# Patient Record
Sex: Female | Born: 1975 | Race: Black or African American | Hispanic: No | Marital: Married | Smoking: Former smoker
Health system: Southern US, Community
[De-identification: ages and names within clinical notes are randomized; demographics above are authoritative.]

## PROBLEM LIST (undated history)

## (undated) DIAGNOSIS — D649 Anemia, unspecified: Secondary | ICD-10-CM

## (undated) DIAGNOSIS — R519 Headache, unspecified: Secondary | ICD-10-CM

## (undated) DIAGNOSIS — R42 Dizziness and giddiness: Secondary | ICD-10-CM

## (undated) DIAGNOSIS — B019 Varicella without complication: Secondary | ICD-10-CM

## (undated) DIAGNOSIS — G4730 Sleep apnea, unspecified: Secondary | ICD-10-CM

## (undated) DIAGNOSIS — J449 Chronic obstructive pulmonary disease, unspecified: Secondary | ICD-10-CM

## (undated) DIAGNOSIS — G935 Compression of brain: Secondary | ICD-10-CM

## (undated) DIAGNOSIS — I10 Essential (primary) hypertension: Secondary | ICD-10-CM

## (undated) DIAGNOSIS — R51 Headache: Secondary | ICD-10-CM

## (undated) DIAGNOSIS — J309 Allergic rhinitis, unspecified: Secondary | ICD-10-CM

## (undated) DIAGNOSIS — D573 Sickle-cell trait: Secondary | ICD-10-CM

## (undated) DIAGNOSIS — D259 Leiomyoma of uterus, unspecified: Secondary | ICD-10-CM

## (undated) HISTORY — PX: OTHER SURGICAL HISTORY: SHX169

## (undated) HISTORY — DX: Essential (primary) hypertension: I10

## (undated) HISTORY — DX: Compression of brain: G93.5

## (undated) HISTORY — DX: Varicella without complication: B01.9

## (undated) HISTORY — DX: Sickle-cell trait: D57.3

## (undated) HISTORY — DX: Allergic rhinitis, unspecified: J30.9

## (undated) HISTORY — PX: BRONCHOSCOPY: SUR163

---

## 2004-09-09 ENCOUNTER — Emergency Department: Payer: Self-pay | Admitting: Emergency Medicine

## 2005-01-28 ENCOUNTER — Emergency Department: Payer: Self-pay | Admitting: Emergency Medicine

## 2006-03-28 ENCOUNTER — Emergency Department: Payer: Self-pay | Admitting: General Practice

## 2006-07-04 ENCOUNTER — Emergency Department: Payer: Self-pay | Admitting: Emergency Medicine

## 2006-11-04 ENCOUNTER — Emergency Department: Payer: Self-pay | Admitting: Emergency Medicine

## 2007-03-19 ENCOUNTER — Emergency Department: Payer: Self-pay | Admitting: Emergency Medicine

## 2007-03-19 ENCOUNTER — Other Ambulatory Visit: Payer: Self-pay

## 2007-12-12 ENCOUNTER — Emergency Department: Payer: Self-pay | Admitting: Internal Medicine

## 2008-03-10 ENCOUNTER — Emergency Department: Payer: Self-pay | Admitting: Emergency Medicine

## 2008-03-22 ENCOUNTER — Ambulatory Visit: Payer: Self-pay | Admitting: Unknown Physician Specialty

## 2008-12-22 ENCOUNTER — Emergency Department (HOSPITAL_COMMUNITY): Admission: EM | Admit: 2008-12-22 | Discharge: 2008-12-22 | Payer: Self-pay | Admitting: Emergency Medicine

## 2009-04-26 ENCOUNTER — Emergency Department: Payer: Self-pay | Admitting: Emergency Medicine

## 2009-05-04 ENCOUNTER — Emergency Department: Payer: Self-pay | Admitting: Emergency Medicine

## 2009-09-26 ENCOUNTER — Emergency Department: Payer: Self-pay | Admitting: Emergency Medicine

## 2009-10-15 ENCOUNTER — Ambulatory Visit: Payer: Self-pay | Admitting: Specialist

## 2010-05-21 ENCOUNTER — Ambulatory Visit: Payer: Self-pay | Admitting: Specialist

## 2010-09-04 ENCOUNTER — Emergency Department: Payer: Self-pay | Admitting: Emergency Medicine

## 2011-03-12 ENCOUNTER — Emergency Department (HOSPITAL_COMMUNITY)
Admission: EM | Admit: 2011-03-12 | Discharge: 2011-03-12 | Disposition: A | Discharge status: Home / Self Care | Payer: Worker's Compensation | Attending: Emergency Medicine | Admitting: Emergency Medicine

## 2011-03-12 ENCOUNTER — Emergency Department (HOSPITAL_COMMUNITY): Payer: Worker's Compensation

## 2011-03-12 DIAGNOSIS — M5126 Other intervertebral disc displacement, lumbar region: Secondary | ICD-10-CM | POA: Insufficient documentation

## 2011-03-12 DIAGNOSIS — X500XXA Overexertion from strenuous movement or load, initial encounter: Secondary | ICD-10-CM | POA: Insufficient documentation

## 2011-03-12 DIAGNOSIS — Y9269 Other specified industrial and construction area as the place of occurrence of the external cause: Secondary | ICD-10-CM | POA: Insufficient documentation

## 2011-03-12 DIAGNOSIS — Y93F2 Activity, caregiving, lifting: Secondary | ICD-10-CM | POA: Insufficient documentation

## 2011-04-06 ENCOUNTER — Ambulatory Visit (HOSPITAL_COMMUNITY)
Admission: RE | Admit: 2011-04-06 | Discharge: 2011-04-06 | Disposition: A | Discharge status: Home / Self Care | Payer: Worker's Compensation | Source: Ambulatory Visit | Attending: Neurosurgery | Admitting: Neurosurgery

## 2011-04-06 DIAGNOSIS — M6281 Muscle weakness (generalized): Secondary | ICD-10-CM | POA: Insufficient documentation

## 2011-04-06 DIAGNOSIS — M545 Low back pain, unspecified: Secondary | ICD-10-CM | POA: Insufficient documentation

## 2011-04-06 DIAGNOSIS — IMO0001 Reserved for inherently not codable concepts without codable children: Secondary | ICD-10-CM | POA: Insufficient documentation

## 2011-04-09 ENCOUNTER — Ambulatory Visit (HOSPITAL_COMMUNITY): Payer: Self-pay | Admitting: Physical Therapy

## 2011-04-14 ENCOUNTER — Ambulatory Visit (HOSPITAL_COMMUNITY): Payer: Self-pay | Admitting: *Deleted

## 2011-04-16 ENCOUNTER — Ambulatory Visit (HOSPITAL_COMMUNITY): Payer: Self-pay | Admitting: Physical Therapy

## 2011-04-19 ENCOUNTER — Ambulatory Visit (HOSPITAL_COMMUNITY): Payer: Self-pay | Admitting: *Deleted

## 2011-04-21 ENCOUNTER — Ambulatory Visit (HOSPITAL_COMMUNITY): Payer: Self-pay | Admitting: *Deleted

## 2011-05-12 ENCOUNTER — Emergency Department (HOSPITAL_COMMUNITY)
Admission: EM | Admit: 2011-05-12 | Discharge: 2011-05-12 | Disposition: A | Discharge status: Home / Self Care | Payer: Medicaid Other | Attending: Emergency Medicine | Admitting: Emergency Medicine

## 2011-05-12 ENCOUNTER — Emergency Department (HOSPITAL_COMMUNITY): Payer: Medicaid Other

## 2011-05-12 DIAGNOSIS — Z79899 Other long term (current) drug therapy: Secondary | ICD-10-CM | POA: Insufficient documentation

## 2011-05-12 DIAGNOSIS — M549 Dorsalgia, unspecified: Secondary | ICD-10-CM | POA: Insufficient documentation

## 2011-05-12 DIAGNOSIS — R51 Headache: Secondary | ICD-10-CM | POA: Insufficient documentation

## 2011-05-12 DIAGNOSIS — G8929 Other chronic pain: Secondary | ICD-10-CM | POA: Insufficient documentation

## 2011-05-12 LAB — DIFFERENTIAL
Lymphocytes Relative: 26 % (ref 12–46)
Lymphs Abs: 3.1 10*3/uL (ref 0.7–4.0)
Monocytes Relative: 8 % (ref 3–12)
Neutro Abs: 7.6 10*3/uL (ref 1.7–7.7)
Neutrophils Relative %: 65 % (ref 43–77)

## 2011-05-12 LAB — CBC
HCT: 38.3 % (ref 36.0–46.0)
Hemoglobin: 13 g/dL (ref 12.0–15.0)
MCH: 27.4 pg (ref 26.0–34.0)
MCV: 80.8 fL (ref 78.0–100.0)
RBC: 4.74 MIL/uL (ref 3.87–5.11)
WBC: 11.7 10*3/uL — ABNORMAL HIGH (ref 4.0–10.5)

## 2011-05-12 LAB — BASIC METABOLIC PANEL
BUN: 7 mg/dL (ref 6–23)
CO2: 32 mEq/L (ref 19–32)
Chloride: 98 mEq/L (ref 96–112)
Creatinine, Ser: 0.68 mg/dL (ref 0.50–1.10)
Potassium: 3.5 mEq/L (ref 3.5–5.1)

## 2011-05-17 ENCOUNTER — Other Ambulatory Visit (HOSPITAL_COMMUNITY): Payer: Self-pay | Admitting: Family Medicine

## 2011-05-17 DIAGNOSIS — E049 Nontoxic goiter, unspecified: Secondary | ICD-10-CM

## 2011-05-20 ENCOUNTER — Ambulatory Visit (HOSPITAL_COMMUNITY)
Admission: RE | Admit: 2011-05-20 | Discharge: 2011-05-20 | Disposition: A | Discharge status: Home / Self Care | Payer: Medicaid Other | Source: Ambulatory Visit | Attending: Family Medicine | Admitting: Family Medicine

## 2011-05-20 DIAGNOSIS — E049 Nontoxic goiter, unspecified: Secondary | ICD-10-CM | POA: Insufficient documentation

## 2012-02-14 ENCOUNTER — Encounter (HOSPITAL_COMMUNITY): Payer: Self-pay

## 2012-02-14 ENCOUNTER — Emergency Department (HOSPITAL_COMMUNITY)
Admission: EM | Admit: 2012-02-14 | Discharge: 2012-02-14 | Disposition: A | Discharge status: Home / Self Care | Payer: Medicaid Other | Attending: Emergency Medicine | Admitting: Emergency Medicine

## 2012-02-14 DIAGNOSIS — N949 Unspecified condition associated with female genital organs and menstrual cycle: Secondary | ICD-10-CM | POA: Insufficient documentation

## 2012-02-14 DIAGNOSIS — R102 Pelvic and perineal pain: Secondary | ICD-10-CM

## 2012-02-14 DIAGNOSIS — N730 Acute parametritis and pelvic cellulitis: Secondary | ICD-10-CM | POA: Insufficient documentation

## 2012-02-14 LAB — CBC
HCT: 35.8 % — ABNORMAL LOW (ref 36.0–46.0)
Hemoglobin: 12.1 g/dL (ref 12.0–15.0)
MCH: 27.4 pg (ref 26.0–34.0)
MCHC: 33.8 g/dL (ref 30.0–36.0)
RDW: 14.1 % (ref 11.5–15.5)

## 2012-02-14 LAB — URINALYSIS, ROUTINE W REFLEX MICROSCOPIC
Bilirubin Urine: NEGATIVE
Glucose, UA: NEGATIVE mg/dL
Ketones, ur: NEGATIVE mg/dL
Leukocytes, UA: NEGATIVE
Nitrite: NEGATIVE
Specific Gravity, Urine: 1.02 (ref 1.005–1.030)
pH: 5.5 (ref 5.0–8.0)

## 2012-02-14 LAB — BASIC METABOLIC PANEL
BUN: 5 mg/dL — ABNORMAL LOW (ref 6–23)
Calcium: 9.6 mg/dL (ref 8.4–10.5)
Chloride: 103 mEq/L (ref 96–112)
Creatinine, Ser: 0.66 mg/dL (ref 0.50–1.10)
GFR calc Af Amer: >90 mL/min (ref >90)
GFR calc non Af Amer: >90 mL/min (ref >90)

## 2012-02-14 LAB — DIFFERENTIAL
Basophils Absolute: 0 10*3/uL (ref 0.0–0.1)
Basophils Relative: 0 % (ref 0–1)
Eosinophils Absolute: 0.1 10*3/uL (ref 0.0–0.7)
Eosinophils Relative: 1 % (ref 0–5)
Monocytes Absolute: 0.5 10*3/uL (ref 0.1–1.0)
Monocytes Relative: 7 % (ref 3–12)
Neutro Abs: 5.7 10*3/uL (ref 1.7–7.7)

## 2012-02-14 LAB — WET PREP, GENITAL
Trich, Wet Prep: NONE SEEN
Yeast Wet Prep HPF POC: NONE SEEN

## 2012-02-14 MED ORDER — HYDROCODONE-ACETAMINOPHEN 5-325 MG PO TABS: ORAL_TABLET | ORAL | Status: AC

## 2012-02-14 MED ORDER — CEFTRIAXONE SODIUM 1 G IJ SOLR
0.5000 g | Freq: Once | INTRAMUSCULAR | Status: AC
  Administered 2012-02-14: 0.5 g via INTRAMUSCULAR
  Filled 2012-02-14: qty 10

## 2012-02-14 MED ORDER — DOXYCYCLINE HYCLATE 100 MG PO CAPS: 100.0000 mg | ORAL_CAPSULE | Freq: Two times a day (BID) | ORAL | Status: AC

## 2012-02-14 MED ORDER — LIDOCAINE HCL (PF) 1 % IJ SOLN
INTRAMUSCULAR | Status: AC
  Administered 2012-02-14: 2.1 mL
  Filled 2012-02-14: qty 5

## 2012-02-14 MED ORDER — METRONIDAZOLE 500 MG PO TABS: 500.0000 mg | ORAL_TABLET | Freq: Two times a day (BID) | ORAL | Status: AC

## 2012-02-14 NOTE — ED Notes (Signed)
Complain of pressure in pelvic area. States she thinks she has a bladder infection. States she has a discharge and foul vaginal odor earlier

## 2012-02-14 NOTE — Discharge Instructions (Signed)
Pelvic Inflammatory Disease Pelvic inflammatory disease (PID) is an infection of some, or all, of the female pelvic organs. PID is caused by germs. HOME CARE  Take your medicine as told. Finish them even if you start to feel better.   Only take medicine as told by your doctor.   Do not have sex (intercourse) until the infection is gone.   Tell your sex partner you have PID. Treatment may be needed.   Keep your follow-up appointments.  GET HELP RIGHT AWAY IF:   You have a fever.   You have an increase in belly (abdominal) pain.   You start to get the chills.   You have pain when you pee (urinate).   You are not better after 72 hours.   Your sex partner tells you he or she has an STD.   You throw up (vomit).   You cannot take your medicines.  MAKE SURE YOU:   Understand these instructions.   Will watch your condition.   Will get help right away if you are not doing well or get worse.  Document Released: 01/21/2009 Document Revised: 10/14/2011 Document Reviewed: 02/24/2010 [facility] Patient Information 2012 [place], [place].

## 2012-02-14 NOTE — ED Provider Notes (Signed)
History     CSN: [id number]  Arrival date & time 02/14/12  8657   First MD Initiated Contact with Patient 02/14/12 1021      Chief Complaint  Patient presents with  . Urinary Tract Infection    (Consider location/radiation/quality/duration/timing/severity/associated sxs/prior treatment) HPI Comments: Patient complains of lower crampy pelvic pain for several days. She also reports having a vaginal discharge with odor several days ago discharge has been waxing and waning. She does report some burning with urination and is concerned she has a urinary tract infection. She describes the pain as cramping and worse with certain movements. She denies fever, vomiting, vaginal bleeding or upper abdominal pain. She does report having a new sexual partner for 3 months.  Patient is a 35 y.o. female presenting with abdominal pain. The history is provided by the patient.  Abdominal Pain The primary symptoms of the illness include abdominal pain, nausea, dysuria and vaginal discharge. The primary symptoms of the illness do not include fever, fatigue, shortness of breath, vomiting, diarrhea, hematemesis, hematochezia or vaginal bleeding. The current episode started more than 2 days ago. The onset of the illness was gradual. The problem has not changed since onset. The abdominal pain began more than 2 days ago. The pain came on gradually. The abdominal pain is located in the suprapubic region. Pain radiation: Lower back. The abdominal pain is relieved by nothing. The abdominal pain is exacerbated by certain positions.  The dysuria is associated with vaginal pain. The dysuria is not associated with hematuria, frequency or urgency.  The vaginal discharge was first noticed more than 2 days ago. Vaginal discharge is a new problem. The amount of discharge is variable. The color of the discharge is white and yellow. The discharge is described as homogeneous. The vaginal discharge is associated with dysuria. The vaginal  discharge is not associated with itching or burning.  The patient states that she believes she is currently not pregnant. The patient has not had a change in bowel habit. Additional symptoms associated with the illness include back pain. Symptoms associated with the illness do not include chills, constipation, urgency, hematuria or frequency.    History reviewed. No pertinent past medical history.  History reviewed. No pertinent past surgical history.  No family history on file.  History  Substance Use Topics  . Smoking status: Not on file  . Smokeless tobacco: Not on file  . Alcohol Use: No    OB History    Grav Para Term Preterm Abortions TAB SAB Ect Mult Living                  Review of Systems  Constitutional: Negative for fever, chills and fatigue.  Respiratory: Negative for shortness of breath.   Gastrointestinal: Positive for nausea and abdominal pain. Negative for vomiting, diarrhea, constipation, blood in stool, hematochezia, abdominal distention and hematemesis.  Genitourinary: Positive for dysuria, vaginal discharge and vaginal pain. Negative for urgency, frequency, hematuria, flank pain, vaginal bleeding, difficulty urinating and menstrual problem.  Musculoskeletal: Positive for back pain.  Skin: Negative.  Negative for itching.  All other systems reviewed and are negative.    Allergies  Review of patient's allergies indicates no known allergies.  Home Medications  No current outpatient prescriptions on file.  BP 126/92  Pulse 72  Temp(Src) 98.3 F (36.8 C) (Oral)  Resp 20  Ht 5\' 6"  (1.676 m)  Wt 160 lb (72.576 kg)  BMI 25.82 kg/m2  SpO2 100%  LMP 01/25/2012  Physical Exam  Nursing note and vitals reviewed. Constitutional: She is oriented to person, place, and time. She appears well-developed and well-nourished. No distress.  HENT:  Head: Normocephalic and atraumatic.  Mouth/Throat: Oropharynx is clear and moist.  Cardiovascular: Normal rate,  regular rhythm and normal heart sounds.   Pulmonary/Chest: Effort normal and breath sounds normal. No respiratory distress.  Abdominal: Soft. She exhibits no distension. There is no hepatosplenomegaly. There is tenderness in the suprapubic area. There is no rigidity, no rebound, no guarding and no CVA tenderness.  Genitourinary: Uterus normal. Cervix exhibits motion tenderness and discharge. Cervix exhibits no friability. Right adnexum displays no mass and no tenderness. Left adnexum displays no mass and no tenderness. No bleeding around the vagina. No foreign body around the vagina. Vaginal discharge found.  Musculoskeletal: Normal range of motion.  Neurological: She is alert and oriented to person, place, and time. She exhibits normal muscle tone. Coordination normal.  Skin: Skin is warm and dry.    ED Course  Procedures (including critical care time)  Results for orders placed during the hospital encounter of 02/14/12  URINALYSIS, ROUTINE W REFLEX MICROSCOPIC      Component Value Range   Color, Urine YELLOW  YELLOW    APPearance CLEAR  CLEAR    Specific Gravity, Urine 1.020  1.005 - 1.030    pH 5.5  5.0 - 8.0    Glucose, UA NEGATIVE  NEGATIVE (mg/dL)   Hgb urine dipstick NEGATIVE  NEGATIVE    Bilirubin Urine NEGATIVE  NEGATIVE    Ketones, ur NEGATIVE  NEGATIVE (mg/dL)   Protein, ur NEGATIVE  NEGATIVE (mg/dL)   Urobilinogen, UA 0.2  0.0 - 1.0 (mg/dL)   Nitrite NEGATIVE  NEGATIVE    Leukocytes, UA NEGATIVE  NEGATIVE   PREGNANCY, URINE      Component Value Range   Preg Test, Ur NEGATIVE  NEGATIVE   WET PREP, GENITAL      Component Value Range   Yeast Wet Prep HPF POC NONE SEEN  NONE SEEN    Trich, Wet Prep NONE SEEN  NONE SEEN    Clue Cells Wet Prep HPF POC MANY (*) NONE SEEN    WBC, Wet Prep HPF POC FEW (*) NONE SEEN   CBC      Component Value Range   WBC 8.4  4.0 - 10.5 (K/uL)   RBC 4.41  3.87 - 5.11 (MIL/uL)   Hemoglobin 12.1  12.0 - 15.0 (g/dL)   HCT 16.1 (*) 09.6 - 46.0  (%)   MCV 81.2  78.0 - 100.0 (fL)   MCH 27.4  26.0 - 34.0 (pg)   MCHC 33.8  30.0 - 36.0 (g/dL)   RDW 04.5  40.9 - 81.1 (%)   Platelets 395  150 - 400 (K/uL)  DIFFERENTIAL      Component Value Range   Neutrophils Relative 68  43 - 77 (%)   Neutro Abs 5.7  1.7 - 7.7 (K/uL)   Lymphocytes Relative 25  12 - 46 (%)   Lymphs Abs 2.1  0.7 - 4.0 (K/uL)   Monocytes Relative 7  3 - 12 (%)   Monocytes Absolute 0.5  0.1 - 1.0 (K/uL)   Eosinophils Relative 1  0 - 5 (%)   Eosinophils Absolute 0.1  0.0 - 0.7 (K/uL)   Basophils Relative 0  0 - 1 (%)   Basophils Absolute 0.0  0.0 - 0.1 (K/uL)  BASIC METABOLIC PANEL      Component Value Range   Sodium 138  135 - 145 (mEq/L)   Potassium 3.9  3.5 - 5.1 (mEq/L)   Chloride 103  96 - 112 (mEq/L)   CO2 28  19 - 32 (mEq/L)   Glucose, Bld 95  70 - 99 (mg/dL)   BUN 5 (*) 6 - 23 (mg/dL)   Creatinine, Ser 1.61  0.50 - 1.10 (mg/dL)   Calcium 9.6  8.4 - 09.6 (mg/dL)   GFR calc non Af Amer >90  >90 (mL/min)   GFR calc Af Amer >90  >90 (mL/min)      GC and Chlamydia cultures are pending   MDM     Vital signs are stable patient is nontoxic appearing. Abdomen is soft and nontender. Mild to moderate pelvic tenderness on exam with some cervical motion tenderness also present. Probable PID, the patient appears stable for discharge.   I will treat with IM injection of Rocephin here in the ED, and prescribe doxycycline and Flagyl. Advise patient of treatment plan and laboratory results she agrees to followup with a gynecologist or to return to the ED for any worsening symptoms  Patient / Family / Caregiver understand and agree with initial ED impression and plan with expectations set for ED visit. Pt stable in ED with no significant deterioration in condition. Pt feels improved after observation and/or treatment in ED.    Avelardo Reesman L. [place], [place] 02/14/12 1234

## 2012-02-14 NOTE — ED Provider Notes (Signed)
Medical screening examination/treatment/procedure(s) were performed by non-physician practitioner and as supervising physician I was immediately available for consultation/collaboration.   Claudette Wermuth L Sreya Froio, MD 02/14/12 1547 

## 2012-02-15 LAB — GC/CHLAMYDIA PROBE AMP, GENITAL: Chlamydia, DNA Probe: NEGATIVE

## 2012-03-01 ENCOUNTER — Emergency Department (HOSPITAL_COMMUNITY): Payer: Medicaid Other

## 2012-03-01 ENCOUNTER — Emergency Department (HOSPITAL_COMMUNITY)
Admission: EM | Admit: 2012-03-01 | Discharge: 2012-03-01 | Disposition: A | Discharge status: Home / Self Care | Payer: Medicaid Other | Attending: Emergency Medicine | Admitting: Emergency Medicine

## 2012-03-01 ENCOUNTER — Other Ambulatory Visit: Payer: Self-pay

## 2012-03-01 ENCOUNTER — Encounter (HOSPITAL_COMMUNITY): Payer: Self-pay | Admitting: Emergency Medicine

## 2012-03-01 DIAGNOSIS — R0602 Shortness of breath: Secondary | ICD-10-CM | POA: Insufficient documentation

## 2012-03-01 DIAGNOSIS — J4489 Other specified chronic obstructive pulmonary disease: Secondary | ICD-10-CM | POA: Insufficient documentation

## 2012-03-01 DIAGNOSIS — J449 Chronic obstructive pulmonary disease, unspecified: Secondary | ICD-10-CM | POA: Insufficient documentation

## 2012-03-01 DIAGNOSIS — R0789 Other chest pain: Secondary | ICD-10-CM

## 2012-03-01 DIAGNOSIS — R42 Dizziness and giddiness: Secondary | ICD-10-CM | POA: Insufficient documentation

## 2012-03-01 DIAGNOSIS — R071 Chest pain on breathing: Secondary | ICD-10-CM | POA: Insufficient documentation

## 2012-03-01 HISTORY — DX: Chronic obstructive pulmonary disease, unspecified: J44.9

## 2012-03-01 LAB — URINALYSIS, ROUTINE W REFLEX MICROSCOPIC
Glucose, UA: NEGATIVE mg/dL
Ketones, ur: NEGATIVE mg/dL
Leukocytes, UA: NEGATIVE
Nitrite: NEGATIVE
Protein, ur: NEGATIVE mg/dL
Urobilinogen, UA: 0.2 mg/dL (ref 0.0–1.0)

## 2012-03-01 LAB — URINE MICROSCOPIC-ADD ON

## 2012-03-01 LAB — DIFFERENTIAL
Basophils Relative: 0 % (ref 0–1)
Eosinophils Absolute: 0.1 10*3/uL (ref 0.0–0.7)
Eosinophils Relative: 1 % (ref 0–5)
Lymphs Abs: 2.8 10*3/uL (ref 0.7–4.0)
Neutrophils Relative %: 49 % (ref 43–77)

## 2012-03-01 LAB — CBC
MCH: 26.9 pg (ref 26.0–34.0)
MCHC: 33.4 g/dL (ref 30.0–36.0)
MCV: 80.5 fL (ref 78.0–100.0)
Platelets: 417 10*3/uL — ABNORMAL HIGH (ref 150–400)
RDW: 14.2 % (ref 11.5–15.5)

## 2012-03-01 LAB — COMPREHENSIVE METABOLIC PANEL
ALT: 10 U/L (ref 0–35)
Albumin: 4.2 g/dL (ref 3.5–5.2)
Alkaline Phosphatase: 69 U/L (ref 39–117)
Calcium: 9.7 mg/dL (ref 8.4–10.5)
GFR calc Af Amer: >90 mL/min (ref >90)
Glucose, Bld: 86 mg/dL (ref 70–99)
Potassium: 3.7 mEq/L (ref 3.5–5.1)
Sodium: 139 mEq/L (ref 135–145)
Total Protein: 8 g/dL (ref 6.0–8.3)

## 2012-03-01 LAB — PREGNANCY, URINE: Preg Test, Ur: NEGATIVE

## 2012-03-01 MED ORDER — HYDROCODONE-ACETAMINOPHEN 5-325 MG PO TABS
2.0000 | ORAL_TABLET | Freq: Once | ORAL | Status: AC
  Administered 2012-03-01: 2 via ORAL
  Filled 2012-03-01: qty 2

## 2012-03-01 MED ORDER — IBUPROFEN 800 MG PO TABS: 800.0000 mg | ORAL_TABLET | Freq: Three times a day (TID) | ORAL | Status: AC

## 2012-03-01 MED ORDER — IBUPROFEN 800 MG PO TABS
800.0000 mg | ORAL_TABLET | Freq: Once | ORAL | Status: AC
  Administered 2012-03-01: 800 mg via ORAL
  Filled 2012-03-01: qty 1

## 2012-03-01 MED ORDER — HYDROCODONE-ACETAMINOPHEN 5-325 MG PO TABS: 1.0000 | ORAL_TABLET | ORAL | Status: AC | PRN

## 2012-03-01 NOTE — Discharge Instructions (Signed)
Apply heat or ice for comfort. Use the medicines as directed. Attention to your body mechanics when moving patients.  Chest Wall Pain Chest wall pain is pain in or around the bones and muscles of your chest. It may take up to 6 weeks to get better. It may take longer if you must stay physically active in your work and activities.  CAUSES  Chest wall pain may happen on its own. However, it may be caused by:  A viral illness like the flu.   Injury.   Coughing.   Exercise.   Arthritis.   Fibromyalgia.   Shingles.  HOME CARE INSTRUCTIONS   Avoid overtiring physical activity. Try not to strain or perform activities that cause pain. This includes any activities using your chest or your abdominal and side muscles, especially if heavy weights are used.   Put ice on the sore area.   Put ice in a plastic bag.   Place a towel between your skin and the bag.   Leave the ice on for 15 to 20 minutes per hour while awake for the first 2 days.   Only take over-the-counter or prescription medicines for pain, discomfort, or fever as directed by your caregiver.  SEEK IMMEDIATE MEDICAL CARE IF:   Your pain increases, or you are very uncomfortable.   You have a fever.   Your chest pain becomes worse.   You have new, unexplained symptoms.   You have nausea or vomiting.   You feel sweaty or lightheaded.   You have a cough with phlegm (sputum), or you cough up blood.  MAKE SURE YOU:   Understand these instructions.   Will watch your condition.   Will get help right away if you are not doing well or get worse.  Document Released: 10/25/2005 Document Revised: 10/14/2011 Document Reviewed: 06/21/2011 [facility] Patient Information 2012 [place], [place].

## 2012-03-01 NOTE — ED Provider Notes (Signed)
History     CSN: [id number]  Arrival date & time 03/01/12  1345   First MD Initiated Contact with Patient 03/01/12 1405      Chief Complaint  Patient presents with  . Chest Pain  . Shortness of Breath  . Dizziness    (Consider location/radiation/quality/duration/timing/severity/associated sxs/prior treatment) HPI  Joann White is a 36 y.o. female who presents to the Emergency Department complaining of left-sided chest pain with radiation through to her back it is worse with deep breathing and movement has been present since 8:00 this morning. 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 She does recall having to do an awkward on a patient. The event occurred on Monday. She has taken no medicines. She denies fever, chills, nausea, vomiting, cough. She has had a sensation of shortness of breath and dizziness when trying to take a deep breath when the pain occurs. Past Medical History  Diagnosis Date  . COPD (chronic obstructive pulmonary disease)     History reviewed. No pertinent past surgical history.  History reviewed. No pertinent family history.  History  Substance Use Topics  . Smoking status: Current Everyday Smoker    Types: Cigarettes  . Smokeless tobacco: Not on file  . Alcohol Use: Yes    OB History    Grav Para Term Preterm Abortions TAB SAB Ect Mult Living                  Review of Systems  Constitutional: Negative for fever.       10 Systems reviewed and are negative for acute change except as noted in the HPI.  HENT: Negative for congestion.   Eyes: Negative for discharge and redness.  Respiratory: Positive for shortness of breath. Negative for cough.   Cardiovascular: Positive for chest pain.  Gastrointestinal: Negative for vomiting and abdominal pain.  Musculoskeletal: Negative for back pain.  Skin: Negative for rash.  Neurological: Positive for dizziness. Negative for syncope, numbness and headaches.    Psychiatric/Behavioral:       No behavior change.    Allergies  Hydromorphone  Home Medications   Current Outpatient Rx  Name Route Sig Dispense Refill  . ACETAMINOPHEN 500 MG PO TABS Oral Take 1,000 mg by mouth every 6 (six) hours as needed. Pain    . ALBUTEROL SULFATE HFA 108 (90 BASE) MCG/ACT IN AERS Inhalation Inhale 2 puffs into the lungs every 6 (six) hours as needed. Tightness/Wheezing    . NAPROXEN SODIUM 220 MG PO TABS Oral Take 220 mg by mouth 2 (two) times daily as needed. Pain      BP 134/99  Pulse 70  Temp(Src) 98.3 F (36.8 C) (Oral)  Resp 18  SpO2 99%  LMP 02/23/2012  Physical Exam  Nursing note and vitals reviewed. Constitutional:       Awake, alert, nontoxic appearance.  HENT:  Head: Atraumatic.  Eyes: Right eye exhibits no discharge. Left eye exhibits no discharge.  Neck: Neck supple.  Cardiovascular: Normal rate, normal heart sounds and intact distal pulses.  Exam reveals no gallop and no friction rub.   No murmur heard. Pulmonary/Chest: Effort normal. She exhibits tenderness.       Tenderness across the left chest with palpation.  Abdominal: Soft. There is no tenderness. There is no rebound.  Musculoskeletal: She exhibits no tenderness.       Baseline ROM, no obvious new focal weakness.  Neurological:  Mental status and motor strength appears baseline for patient and situation.  Skin: No rash noted.  Psychiatric: She has a normal mood and affect.    ED Course  Procedures (including critical care time) Results for orders placed during the hospital encounter of 03/01/12  URINALYSIS, ROUTINE W REFLEX MICROSCOPIC      Component Value Range   Color, Urine YELLOW  YELLOW    APPearance CLEAR  CLEAR    Specific Gravity, Urine 1.020  1.005 - 1.030    pH 5.5  5.0 - 8.0    Glucose, UA NEGATIVE  NEGATIVE (mg/dL)   Hgb urine dipstick TRACE (*) NEGATIVE    Bilirubin Urine NEGATIVE  NEGATIVE    Ketones, ur NEGATIVE  NEGATIVE (mg/dL)   Protein, ur  NEGATIVE  NEGATIVE (mg/dL)   Urobilinogen, UA 0.2  0.0 - 1.0 (mg/dL)   Nitrite NEGATIVE  NEGATIVE    Leukocytes, UA NEGATIVE  NEGATIVE   PREGNANCY, URINE      Component Value Range   Preg Test, Ur NEGATIVE  NEGATIVE   CBC      Component Value Range   WBC 6.3  4.0 - 10.5 (K/uL)   RBC 4.35  3.87 - 5.11 (MIL/uL)   Hemoglobin 11.7 (*) 12.0 - 15.0 (g/dL)   HCT 16.1 (*) 09.6 - 46.0 (%)   MCV 80.5  78.0 - 100.0 (fL)   MCH 26.9  26.0 - 34.0 (pg)   MCHC 33.4  30.0 - 36.0 (g/dL)   RDW 04.5  40.9 - 81.1 (%)   Platelets 417 (*) 150 - 400 (K/uL)  DIFFERENTIAL      Component Value Range   Neutrophils Relative 49  43 - 77 (%)   Neutro Abs 3.1  1.7 - 7.7 (K/uL)   Lymphocytes Relative 44  12 - 46 (%)   Lymphs Abs 2.8  0.7 - 4.0 (K/uL)   Monocytes Relative 6  3 - 12 (%)   Monocytes Absolute 0.4  0.1 - 1.0 (K/uL)   Eosinophils Relative 1  0 - 5 (%)   Eosinophils Absolute 0.1  0.0 - 0.7 (K/uL)   Basophils Relative 0  0 - 1 (%)   Basophils Absolute 0.0  0.0 - 0.1 (K/uL)  URINE MICROSCOPIC-ADD ON      Component Value Range   Squamous Epithelial / LPF RARE  RARE    WBC, UA 3-6  <3 (WBC/hpf)   RBC / HPF 0-2  <3 (RBC/hpf)   Bacteria, UA MANY (*) RARE     Dg Chest 2 View  03/01/2012  *RADIOLOGY REPORT*  Clinical Data: Chest pain.  Shortness of breath.  Dizziness.  CHEST - 2 VIEW  Comparison: None.  Findings: Heart size is normal.  Mediastinal shadows are normal. Lungs are clear.  The vascularity is normal.  No effusions.  No significant bony finding.  IMPRESSION: Normal chest  Original Report Authenticated By: Thomasenia Sales, M.D.       MDM  Patient presents with left-sided chest pain with radiation to her back that began this morning. It is associated with some shortness of breath and dizziness when the pain is present. 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 She has been given analgesics and anti-inflammatory. Laboratory values are  normal, chest x-ray is unremarkable, EKG is unremarkable.Pt stable in ED with no significant deterioration in condition.The patient appears reasonably screened and/or stabilized for discharge and I doubt any other medical  condition or other [facility] requiring further screening, evaluation, or treatment in the ED at this time prior to discharge.  MDM Reviewed: nursing note and vitals Interpretation: labs, ECG and x-ray           Nicoletta Dress. Colon Branch, MD 03/01/12 1550

## 2012-03-01 NOTE — ED Notes (Signed)
Discharge instructions reviewed with pt, questions answered. Pt verbalized understanding.  

## 2012-03-01 NOTE — ED Notes (Signed)
Pt c/o left chest pain that radiates to her back. Pt also c/o dizziness and sob.

## 2013-04-19 ENCOUNTER — Emergency Department: Payer: Self-pay | Admitting: Emergency Medicine

## 2013-04-19 LAB — BASIC METABOLIC PANEL
Calcium, Total: 9.6 mg/dL (ref 8.5–10.1)
Chloride: 104 mmol/L (ref 98–107)
EGFR (Non-African Amer.): >60
Glucose: 109 mg/dL — ABNORMAL HIGH (ref 65–99)
Osmolality: 272 (ref 275–301)
Potassium: 3.6 mmol/L (ref 3.5–5.1)
Sodium: 137 mmol/L (ref 136–145)

## 2013-04-19 LAB — TROPONIN I: Troponin-I: <0.02 ng/mL

## 2013-04-19 LAB — CBC
HCT: 36.9 % (ref 35.0–47.0)
MCH: 26.9 pg (ref 26.0–34.0)
Platelet: 408 10*3/uL (ref 150–440)

## 2013-11-29 ENCOUNTER — Emergency Department: Payer: Self-pay | Admitting: Emergency Medicine

## 2013-11-29 LAB — RAPID INFLUENZA A&B ANTIGENS

## 2014-04-26 ENCOUNTER — Emergency Department: Payer: Self-pay | Admitting: Emergency Medicine

## 2014-04-26 LAB — URINALYSIS, COMPLETE
BILIRUBIN, UR: NEGATIVE
BLOOD: NEGATIVE
Bacteria: NONE SEEN
GLUCOSE, UR: NEGATIVE mg/dL (ref 0–75)
Ketone: NEGATIVE
LEUKOCYTE ESTERASE: NEGATIVE
NITRITE: NEGATIVE
Ph: 5 (ref 4.5–8.0)
Protein: NEGATIVE
Specific Gravity: 1.018 (ref 1.003–1.030)
WBC UR: 2 /HPF (ref 0–5)

## 2014-07-31 ENCOUNTER — Encounter (HOSPITAL_COMMUNITY): Payer: Self-pay | Admitting: Emergency Medicine

## 2014-07-31 ENCOUNTER — Emergency Department (HOSPITAL_COMMUNITY)
Admission: EM | Admit: 2014-07-31 | Discharge: 2014-07-31 | Disposition: A | Discharge status: Home / Self Care | Payer: Worker's Compensation | Attending: Emergency Medicine | Admitting: Emergency Medicine

## 2014-07-31 DIAGNOSIS — M5432 Sciatica, left side: Secondary | ICD-10-CM

## 2014-07-31 DIAGNOSIS — J4489 Other specified chronic obstructive pulmonary disease: Secondary | ICD-10-CM | POA: Insufficient documentation

## 2014-07-31 DIAGNOSIS — Z79899 Other long term (current) drug therapy: Secondary | ICD-10-CM | POA: Insufficient documentation

## 2014-07-31 DIAGNOSIS — F17200 Nicotine dependence, unspecified, uncomplicated: Secondary | ICD-10-CM | POA: Insufficient documentation

## 2014-07-31 DIAGNOSIS — M5430 Sciatica, unspecified side: Secondary | ICD-10-CM | POA: Insufficient documentation

## 2014-07-31 DIAGNOSIS — M79609 Pain in unspecified limb: Secondary | ICD-10-CM | POA: Insufficient documentation

## 2014-07-31 DIAGNOSIS — J449 Chronic obstructive pulmonary disease, unspecified: Secondary | ICD-10-CM | POA: Insufficient documentation

## 2014-07-31 MED ORDER — HYDROCODONE-ACETAMINOPHEN 5-325 MG PO TABS: 1.0000 | ORAL_TABLET | ORAL | Status: DC | PRN

## 2014-07-31 MED ORDER — METHOCARBAMOL 500 MG PO TABS: 500.0000 mg | ORAL_TABLET | Freq: Three times a day (TID) | ORAL | Status: DC

## 2014-07-31 MED ORDER — DIAZEPAM 5 MG PO TABS
5.0000 mg | ORAL_TABLET | Freq: Once | ORAL | Status: AC
  Administered 2014-07-31: 5 mg via ORAL
  Filled 2014-07-31: qty 1

## 2014-07-31 MED ORDER — HYDROCODONE-ACETAMINOPHEN 5-325 MG PO TABS
2.0000 | ORAL_TABLET | Freq: Once | ORAL | Status: AC
  Administered 2014-07-31: 2 via ORAL
  Filled 2014-07-31: qty 2

## 2014-07-31 MED ORDER — IBUPROFEN 800 MG PO TABS: 800.0000 mg | ORAL_TABLET | Freq: Three times a day (TID) | ORAL | Status: DC

## 2014-07-31 MED ORDER — IBUPROFEN 800 MG PO TABS
800.0000 mg | ORAL_TABLET | Freq: Once | ORAL | Status: AC
  Administered 2014-07-31: 800 mg via ORAL
  Filled 2014-07-31: qty 1

## 2014-07-31 MED ORDER — ONDANSETRON HCL 4 MG PO TABS
4.0000 mg | ORAL_TABLET | Freq: Once | ORAL | Status: AC
  Administered 2014-07-31: 4 mg via ORAL
  Filled 2014-07-31: qty 1

## 2014-07-31 NOTE — Discharge Instructions (Signed)
In your examination suggest sciatica, or irritation of one nerves in the back. Please rest your back is much possible. Please use a heating pad when possible. Please use Motrin and Robaxin daily. Please use Norco for more severe pain. Norco and Robaxin may cause drowsiness, please use with caution. Please call the [place] [facility] at [phone], and asked for positions taking on new patients to establish a primary care physician. Please call the orthopedic specialist listed above, or the orthopedic specialist of your choice for additional evaluation concerning your back. Sciatica Sciatica is pain, weakness, numbness, or tingling along the path of the sciatic nerve. The nerve starts in the lower back and runs down the back of each leg. The nerve controls the muscles in the lower leg and in the back of the knee, while also providing sensation to the back of the thigh, lower leg, and the sole of your foot. Sciatica is a symptom of another medical condition. For instance, nerve damage or certain conditions, such as a herniated disk or bone spur on the spine, pinch or put pressure on the sciatic nerve. This causes the pain, weakness, or other sensations normally associated with sciatica. Generally, sciatica only affects one side of the body. CAUSES   Herniated or slipped disc.  Degenerative disk disease.  A pain disorder involving the narrow muscle in the buttocks (piriformis syndrome).  Pelvic injury or fracture.  Pregnancy.  Tumor (rare). SYMPTOMS  Symptoms can vary from mild to very severe. The symptoms usually travel from the low back to the buttocks and down the back of the leg. Symptoms can include:  Mild tingling or dull aches in the lower back, leg, or hip.  Numbness in the back of the calf or sole of the foot.  Burning sensations in the lower back, leg, or hip.  Sharp pains in the lower back, leg, or hip.  Leg weakness.  Severe back pain inhibiting movement. These symptoms may  get worse with coughing, sneezing, laughing, or prolonged sitting or standing. Also, being overweight may worsen symptoms. DIAGNOSIS  Your caregiver will perform a physical exam to look for common symptoms of sciatica. He or she may ask you to do certain movements or activities that would trigger sciatic nerve pain. Other tests may be performed to find the cause of the sciatica. These may include:  Blood tests.  X-rays.  Imaging tests, such as an MRI or CT scan. TREATMENT  Treatment is directed at the cause of the sciatic pain. Sometimes, treatment is not necessary and the pain and discomfort goes away on its own. If treatment is needed, your caregiver may suggest:  Over-the-counter medicines to relieve pain.  Prescription medicines, such as anti-inflammatory medicine, muscle relaxants, or narcotics.  Applying heat or ice to the painful area.  Steroid injections to lessen pain, irritation, and inflammation around the nerve.  Reducing activity during periods of pain.  Exercising and stretching to strengthen your abdomen and improve flexibility of your spine. Your caregiver may suggest losing weight if the extra weight makes the back pain worse.  Physical therapy.  Surgery to eliminate what is pressing or pinching the nerve, such as a bone spur or part of a herniated disk. HOME CARE INSTRUCTIONS   Only take over-the-counter or prescription medicines for pain or discomfort as directed by your caregiver.  Apply ice to the affected area for 20 minutes, 3-4 times a day for the first 48-72 hours. Then try heat in the same way.  Exercise, stretch, or  perform your usual activities if these do not aggravate your pain.  Attend physical therapy sessions as directed by your caregiver.  Keep all follow-up appointments as directed by your caregiver.  Do not wear high heels or shoes that do not provide proper support.  Check your mattress to see if it is too soft. A firm mattress may lessen  your pain and discomfort. SEEK IMMEDIATE MEDICAL CARE IF:   You lose control of your bowel or bladder (incontinence).  You have increasing weakness in the lower back, pelvis, buttocks, or legs.  You have redness or swelling of your back.  You have a burning sensation when you urinate.  You have pain that gets worse when you lie down or awakens you at night.  Your pain is worse than you have experienced in the past.  Your pain is lasting longer than 4 weeks.  You are suddenly losing weight without reason. MAKE SURE YOU:  Understand these instructions.  Will watch your condition.  Will get help right away if you are not doing well or get worse. Document Released: 10/19/2001 Document Revised: 04/25/2012 Document Reviewed: 03/05/2012 [facility] Patient Information 2015 [place], [place]. This information is not intended to replace advice given to you by your health care provider. Make sure you discuss any questions you have with your health care provider.

## 2014-07-31 NOTE — ED Notes (Signed)
Pt c/o left leg pain that radiates down to the foot.

## 2014-07-31 NOTE — ED Provider Notes (Signed)
CSN: [id number]     Arrival date & time 07/31/14  2022 History   First MD Initiated Contact with Patient 07/31/14 2137     Chief Complaint  Patient presents with  . Leg Pain     (Consider location/radiation/quality/duration/timing/severity/associated sxs/prior Treatment) HPI Comments: Will patient is a 38 year old female who presents to the emergency department with left leg pain. The patient states that she has pain in her lower back that radiates into the left buttocks and then down the left leg into the left first toe. The pain is described as a pins and needles sensation accompanied by a burning sensation. His been no injury to the back, or to the leg. It is of note that the patient states that 2 or 3 years ago she was diagnosed with some degenerative disc disease changes. He's not had any followup concerning this during this time. The patient has not had any loss of bowel or bladder function. The pain is getting progressively worse over the last 2 or 3 days. Activity seems to aggravate it more than anything else.  Patient is a 38 y.o. female presenting with leg pain. The history is provided by the patient.  Leg Pain Associated symptoms: back pain   Associated symptoms: no neck pain     Past Medical History  Diagnosis Date  . COPD (chronic obstructive pulmonary disease)    History reviewed. No pertinent past surgical history. History reviewed. No pertinent family history. History  Substance Use Topics  . Smoking status: Current Every Day Smoker    Types: Cigarettes  . Smokeless tobacco: Not on file  . Alcohol Use: Yes   OB History   Grav Para Term Preterm Abortions TAB SAB Ect Mult Living                 Review of Systems  Constitutional: Negative for activity change.       All ROS Neg except as noted in HPI  HENT: Negative for nosebleeds.   Eyes: Negative for photophobia and discharge.  Respiratory: Negative for cough, shortness of breath and wheezing.   Cardiovascular:  Negative for chest pain and palpitations.  Gastrointestinal: Negative for abdominal pain and blood in stool.  Genitourinary: Negative for dysuria, frequency and hematuria.  Musculoskeletal: Positive for back pain. Negative for arthralgias and neck pain.  Skin: Negative.   Neurological: Negative for dizziness, seizures and speech difficulty.  Psychiatric/Behavioral: Negative for hallucinations and confusion.      Allergies  Hydromorphone  Home Medications   Prior to Admission medications   Medication Sig Start Date End Date Taking? Authorizing Provider  acetaminophen (TYLENOL) 500 MG tablet Take 1,000 mg by mouth every 6 (six) hours as needed. Pain    Historical Provider, MD  albuterol (PROVENTIL HFA;VENTOLIN HFA) 108 (90 BASE) MCG/ACT inhaler Inhale 2 puffs into the lungs every 6 (six) hours as needed. Tightness/Wheezing    Historical Provider, MD  HYDROcodone-acetaminophen (NORCO/VICODIN) 5-325 MG per tablet Take 1 tablet by mouth every 4 (four) hours as needed. 07/31/14   Lenox Ahr, PA-C  ibuprofen (ADVIL,MOTRIN) 800 MG tablet Take 1 tablet (800 mg total) by mouth 3 (three) times daily. 07/31/14   Lenox Ahr, PA-C  methocarbamol (ROBAXIN) 500 MG tablet Take 1 tablet (500 mg total) by mouth 3 (three) times daily. 07/31/14   Lenox Ahr, PA-C  naproxen sodium (ALEVE) 220 MG tablet Take 220 mg by mouth 2 (two) times daily as needed. Pain    Historical Provider, MD  BP 140/87  Pulse 91  Temp(Src) 98.7 F (37.1 C) (Oral)  Resp 17  Ht 5\' 6"  (1.676 m)  Wt 170 lb (77.111 kg)  BMI 27.45 kg/m2  SpO2 100%  LMP 07/10/2014 Physical Exam  Nursing note and vitals reviewed. Constitutional: She is oriented to person, place, and time. She appears well-developed and well-nourished.  Non-toxic appearance.  HENT:  Head: Normocephalic.  Right Ear: Tympanic membrane and external ear normal.  Left Ear: Tympanic membrane and external ear normal.  Eyes: EOM and lids are normal. Pupils  are equal, round, and reactive to light.  Neck: Normal range of motion. Neck supple. Carotid bruit is not present.  Cardiovascular: Normal rate, regular rhythm, normal heart sounds, intact distal pulses and normal pulses.   Pulmonary/Chest: Breath sounds normal. No respiratory distress.  Abdominal: Soft. Bowel sounds are normal. There is no tenderness. There is no guarding.  Musculoskeletal:       Lumbar back: She exhibits decreased range of motion, tenderness and pain.  Pain with straight leg raise at 35. No hot areas appreciated.  Lymphadenopathy:       Head (right side): No submandibular adenopathy present.       Head (left side): No submandibular adenopathy present.    She has no cervical adenopathy.  Neurological: She is alert and oriented to person, place, and time. She has normal strength. No cranial nerve deficit or sensory deficit.  Skin: Skin is warm and dry.  Psychiatric: She has a normal mood and affect. Her speech is normal.    ED Course  Procedures (including critical care time) Labs Review Labs Reviewed - No data to display  Imaging Review No results found.   EKG Interpretation None      MDM  I have reviewed the previous lumbar spine x-rays and MRI. The patient has a history of some degenerative disc disease at the L5-S1 area.  Examination at this time favors sciatica going down the left leg. I have encouraged the patient to call the [facility] to find physicians who are taking new patients, as the patient does not have a primary care physician. I've also given the patient the name of the abdomen medicine clinic here in [place] area. The patient is referred to Dr. Tamera Punt for additional evaluation of the back. The patient is treated with Norco, ibuprofen, and Robaxin.    Final diagnoses:  Sciatica, left    *I have reviewed nursing notes, vital signs, and all appropriate lab and imaging results for this patient.Lenox Ahr,  PA-C 07/31/14 2228

## 2014-07-31 NOTE — ED Notes (Signed)
Pt is ambulatory in triage and denies any injury to leg or foot.

## 2014-08-01 NOTE — ED Provider Notes (Signed)
Medical screening examination/treatment/procedure(s) were performed by non-physician practitioner and as supervising physician I was immediately available for consultation/collaboration.     Veryl Speak, MD 08/01/14 [phone]

## 2014-08-26 ENCOUNTER — Emergency Department (HOSPITAL_COMMUNITY)
Admission: EM | Admit: 2014-08-26 | Discharge: 2014-08-26 | Disposition: A | Discharge status: Home / Self Care | Payer: Medicaid Other | Attending: Emergency Medicine | Admitting: Emergency Medicine

## 2014-08-26 ENCOUNTER — Emergency Department (HOSPITAL_COMMUNITY): Payer: Medicaid Other

## 2014-08-26 ENCOUNTER — Encounter (HOSPITAL_COMMUNITY): Payer: Self-pay | Admitting: Emergency Medicine

## 2014-08-26 DIAGNOSIS — R091 Pleurisy: Secondary | ICD-10-CM | POA: Insufficient documentation

## 2014-08-26 DIAGNOSIS — J441 Chronic obstructive pulmonary disease with (acute) exacerbation: Secondary | ICD-10-CM | POA: Diagnosis not present

## 2014-08-26 DIAGNOSIS — Z79899 Other long term (current) drug therapy: Secondary | ICD-10-CM | POA: Diagnosis not present

## 2014-08-26 DIAGNOSIS — Z720 Tobacco use: Secondary | ICD-10-CM | POA: Diagnosis not present

## 2014-08-26 DIAGNOSIS — R079 Chest pain, unspecified: Secondary | ICD-10-CM | POA: Diagnosis present

## 2014-08-26 LAB — CBC WITH DIFFERENTIAL/PLATELET
Basophils Absolute: 0 10*3/uL (ref 0.0–0.1)
Basophils Relative: 0 % (ref 0–1)
EOS ABS: 0.1 10*3/uL (ref 0.0–0.7)
EOS PCT: 1 % (ref 0–5)
HEMATOCRIT: 37.3 % (ref 36.0–46.0)
Hemoglobin: 12.8 g/dL (ref 12.0–15.0)
LYMPHS ABS: 2.9 10*3/uL (ref 0.7–4.0)
Lymphocytes Relative: 33 % (ref 12–46)
MCH: 26.5 pg (ref 26.0–34.0)
MCHC: 34.3 g/dL (ref 30.0–36.0)
MCV: 77.2 fL — AB (ref 78.0–100.0)
MONO ABS: 0.6 10*3/uL (ref 0.1–1.0)
MONOS PCT: 7 % (ref 3–12)
Neutro Abs: 5.2 10*3/uL (ref 1.7–7.7)
Neutrophils Relative %: 59 % (ref 43–77)
PLATELETS: 472 10*3/uL — AB (ref 150–400)
RBC: 4.83 MIL/uL (ref 3.87–5.11)
RDW: 15.9 % — ABNORMAL HIGH (ref 11.5–15.5)
WBC: 8.8 10*3/uL (ref 4.0–10.5)

## 2014-08-26 LAB — PRO B NATRIURETIC PEPTIDE: PRO B NATRI PEPTIDE: 17.2 pg/mL (ref 0–125)

## 2014-08-26 LAB — TROPONIN I: Troponin I: <0.3 ng/mL (ref <0.30)

## 2014-08-26 MED ORDER — KETOROLAC TROMETHAMINE 60 MG/2ML IM SOLN
60.0000 mg | Freq: Once | INTRAMUSCULAR | Status: AC
  Administered 2014-08-26: 60 mg via INTRAMUSCULAR
  Filled 2014-08-26: qty 2

## 2014-08-26 NOTE — ED Notes (Signed)
Patient complaining of of left-sided chest pain since 1100 today. States "It hurts worse when I breathe in, but not when I breathe out." Also complaining of shortness of breath and nausea.

## 2014-08-26 NOTE — ED Provider Notes (Signed)
CSN: [id number]     Arrival date & time 08/26/14  1401 History  This chart was scribed for Johnna Acosta, MD by Marlowe Kays, ED Scribe. This patient was seen in room APA11/[facility]11 and the patient's care was started at 3:08 PM.  Chief Complaint  Patient presents with  . Chest Pain   The history is provided by the patient. No language interpreter was used.   HPI Comments:  Joann White is a 38 y.o. female with PMHx of COPD who presents to the Emergency Department complaining of sudden onset, constant, dull, aching CP located behind her left breast that started four hours ago. She reports occasional sharp shooting pain. Movement and inhalation makes the pain worsen. She also reports associated nausea that she believes is because she has not eaten today. She denies leg swelling, recent travel, trauma, injury or fall or other pulmonary embolism risk factors except for being a current smoker. Denies increased cough. Reports being an active person walking between 1-2 miles at times.  Past Medical History  Diagnosis Date  . COPD (chronic obstructive pulmonary disease)    History reviewed. No pertinent past surgical history. History reviewed. No pertinent family history. History  Substance Use Topics  . Smoking status: Current Every Day Smoker    Types: Cigarettes  . Smokeless tobacco: Not on file  . Alcohol Use: Yes     Comment: rarely   OB History   Grav Para Term Preterm Abortions TAB SAB Ect Mult Living                 Review of Systems  Respiratory: Positive for shortness of breath. Negative for cough.   Cardiovascular: Positive for chest pain. Negative for leg swelling.  All other systems reviewed and are negative.   Allergies  Hydromorphone  Home Medications   Prior to Admission medications   Medication Sig Start Date End Date Taking? Authorizing Provider  albuterol (PROVENTIL HFA;VENTOLIN HFA) 108 (90 BASE) MCG/ACT inhaler Inhale 2 puffs into the lungs every 6  (six) hours as needed. Tightness/Wheezing   Yes Historical Provider, MD  Cyanocobalamin (VITAMIN B-12 PO) Take 1 tablet by mouth daily.   Yes Historical Provider, MD  naproxen sodium (ALEVE) 220 MG tablet Take 220 mg by mouth 2 (two) times daily as needed. Pain   Yes Historical Provider, MD   Triage Vitals: BP 122/91  Pulse 83  Temp(Src) 98.9 F (37.2 C) (Oral)  Resp 16  Ht 5\' 6"  (1.676 m)  Wt 170 lb (77.111 kg)  BMI 27.45 kg/m2  SpO2 100%  LMP 08/07/2014 Physical Exam  Nursing note and vitals reviewed. Constitutional: She appears well-developed and well-nourished. No distress.  HENT:  Head: Normocephalic and atraumatic.  Mouth/Throat: Oropharynx is clear and moist. No oropharyngeal exudate.  Eyes: Conjunctivae and EOM are normal. Pupils are equal, round, and reactive to light. Right eye exhibits no discharge. Left eye exhibits no discharge. No scleral icterus.  Neck: Normal range of motion. Neck supple. No JVD present. No thyromegaly present.  Cardiovascular: Normal rate, regular rhythm, normal heart sounds and intact distal pulses.  Exam reveals no gallop and no friction rub.   No murmur heard. Pulmonary/Chest: Effort normal and breath sounds normal. No respiratory distress. She has no wheezes. She has no rales.  Abdominal: Soft. Bowel sounds are normal. She exhibits no distension and no mass. There is no tenderness.  Musculoskeletal: Normal range of motion. She exhibits no edema and no tenderness.  Lymphadenopathy:    She  has no cervical adenopathy.  Neurological: She is alert. Coordination normal.  Skin: Skin is warm and dry. No rash noted. No erythema.  Psychiatric: She has a normal mood and affect. Her behavior is normal.    ED Course  Procedures (including critical care time) DIAGNOSTIC STUDIES: Oxygen Saturation is 100% on RA, normal by my interpretation.   COORDINATION OF CARE: 3:13 PM- Will order CXR. Pt verbalizes understanding and agrees to plan.  Medications   ketorolac (TORADOL) injection 60 mg (60 mg Intramuscular Given 08/26/14 1555)    Labs Review Labs Reviewed  CBC WITH DIFFERENTIAL - Abnormal; Notable for the following:    MCV 77.2 (*)    RDW 15.9 (*)    Platelets 472 (*)    All other components within normal limits  PRO B NATRIURETIC PEPTIDE  TROPONIN I    Imaging Review Dg Chest 2 View  08/26/2014   CLINICAL DATA:  Left chest pain and shortness of breath beginning this morning.  EXAM: CHEST  2 VIEW  COMPARISON:  PA and lateral chest 03/01/2012.  FINDINGS: Heart size and mediastinal contours are within normal limits. Both lungs are clear. Visualized skeletal structures are unremarkable.  IMPRESSION: Negative exam.   Electronically Signed   By: Inge Rise M.D.   On: 08/26/2014 16:15     MDM   Final diagnoses:  Pleurisy   Pain exacerbated by sitting up and inhalation but not with palpation.   ED ECG REPORT  I personally interpreted this EKG   Date: 08/26/2014   Rate: 87  Rhythm: normal sinus rhythm  QRS Axis: normal  Intervals: normal  ST/T Wave abnormalities: normal  Conduction Disutrbances:none  Narrative Interpretation:   Old EKG Reviewed: none available  CXR normal, likely pleurisy  Meds given in ED:  Medications  ketorolac (TORADOL) injection 60 mg (60 mg Intramuscular Given 08/26/14 1555)    New Prescriptions   No medications on file     I personally performed the services described in this documentation, which was scribed in my presence. The recorded information has been reviewed and is accurate.    Johnna Acosta, MD 08/26/14 [phone]

## 2014-08-26 NOTE — Discharge Instructions (Signed)
Pleurisy Pleurisy is an inflammation and swelling of the lining of the lungs (pleura). Because of this inflammation, it hurts to breathe. It can be aggravated by coughing, laughing, or deep breathing. Pleurisy is often caused by an underlying infection or disease.  HOME CARE INSTRUCTIONS  Monitor your pleurisy for any changes. The following actions may help to alleviate any discomfort you are experiencing:  Medicine may help with pain. Only take over-the-counter or prescription medicines for pain, discomfort, or fever as directed by your health care provider.  Only take antibiotic medicine as directed. Make sure to finish it even if you start to feel better. SEEK MEDICAL CARE IF:   Your pain is not controlled with medicine or is increasing.  You have an increase in pus-like (purulent) secretions brought up with coughing. SEEK IMMEDIATE MEDICAL CARE IF:   You have blue or dark lips, fingernails, or toenails.  You are coughing up blood.  You have increased difficulty breathing.  You have continuing pain unrelieved by medicine or pain lasting more than 1 week.  You have pain that radiates into your neck, arms, or jaw.  You develop increased shortness of breath or wheezing.  You develop a fever, rash, vomiting, fainting, or other serious symptoms. MAKE SURE YOU:  Understand these instructions.   Will watch your condition.   Will get help right away if you are not doing well or get worse.  Document Released: 10/25/2005 Document Revised: 06/27/2013 Document Reviewed: 04/08/2013 [facility] Patient Information 2015 [place], [place]. This information is not intended to replace advice given to you by your health care provider. Make sure you discuss any questions you have with your health care provider.   [facility] Primary Care Doctor List    Sinda Du MD. Specialty: Pulmonary Disease Contact information: [address]  [place] [address]  [phone]    Tula Nakayama, MD. Specialty: [facility] Medicine Contact information: [address], [address] [place] [address]  [phone]   Sallee Lange, MD. Specialty: [facility] Medicine Contact information: [address]  [place] [address]  [phone]   Rosita Fire, MD Specialty: Internal Medicine Contact information: [place] [address]  [phone]   Delphina Cahill, MD. Specialty: Internal Medicine Contact information: [place] [address]  [phone]   Marjean Donna, MD. Specialty: Family Medicine Contact information: [place] [address]  [phone]   Leslie Andrea, MD. Specialty: [facility] Medicine Contact information: [place] [address]  [phone]   Asencion Noble, MD. Specialty: Internal Medicine Contact information: [place]  [place] [address]  [phone]

## 2015-11-13 ENCOUNTER — Ambulatory Visit: Payer: Worker's Compensation | Admitting: Family Medicine

## 2015-11-14 ENCOUNTER — Ambulatory Visit (INDEPENDENT_AMBULATORY_CARE_PROVIDER_SITE_OTHER): Payer: BLUE CROSS/BLUE SHIELD | Admitting: Family Medicine

## 2015-11-14 VITALS — BP 154/98 | HR 99 | Temp 98.6°F | Ht 66.0 in | Wt 193.4 lb

## 2015-11-14 DIAGNOSIS — R079 Chest pain, unspecified: Secondary | ICD-10-CM

## 2015-11-14 DIAGNOSIS — R51 Headache: Secondary | ICD-10-CM

## 2015-11-14 DIAGNOSIS — R002 Palpitations: Secondary | ICD-10-CM | POA: Diagnosis not present

## 2015-11-14 DIAGNOSIS — R350 Frequency of micturition: Secondary | ICD-10-CM | POA: Diagnosis not present

## 2015-11-14 DIAGNOSIS — H539 Unspecified visual disturbance: Secondary | ICD-10-CM

## 2015-11-14 DIAGNOSIS — G8929 Other chronic pain: Secondary | ICD-10-CM

## 2015-11-14 DIAGNOSIS — R1310 Dysphagia, unspecified: Secondary | ICD-10-CM | POA: Diagnosis not present

## 2015-11-14 DIAGNOSIS — R519 Headache, unspecified: Secondary | ICD-10-CM

## 2015-11-14 DIAGNOSIS — I10 Essential (primary) hypertension: Secondary | ICD-10-CM

## 2015-11-14 LAB — LIPID PANEL
CHOL/HDL RATIO: 4
CHOLESTEROL: 145 mg/dL (ref 0–200)
HDL: 33.1 mg/dL — ABNORMAL LOW (ref >39.00)
LDL CALC: 83 mg/dL (ref 0–99)
NonHDL: 111.86
Triglycerides: 142 mg/dL (ref 0.0–149.0)
VLDL: 28.4 mg/dL (ref 0.0–40.0)

## 2015-11-14 LAB — COMPREHENSIVE METABOLIC PANEL
ALBUMIN: 3.9 g/dL (ref 3.5–5.2)
ALT: 10 U/L (ref 0–35)
AST: 14 U/L (ref 0–37)
Alkaline Phosphatase: 74 U/L (ref 39–117)
BUN: 8 mg/dL (ref 6–23)
CHLORIDE: 105 meq/L (ref 96–112)
CO2: 27 meq/L (ref 19–32)
Calcium: 9.5 mg/dL (ref 8.4–10.5)
Creatinine, Ser: 0.6 mg/dL (ref 0.40–1.20)
GFR: 142.82 mL/min (ref >60.00)
GLUCOSE: 95 mg/dL (ref 70–99)
POTASSIUM: 3.8 meq/L (ref 3.5–5.1)
SODIUM: 138 meq/L (ref 135–145)
Total Bilirubin: 0.3 mg/dL (ref 0.2–1.2)
Total Protein: 7.5 g/dL (ref 6.0–8.3)

## 2015-11-14 LAB — POCT URINALYSIS DIPSTICK
BILIRUBIN UA: NEGATIVE
Glucose, UA: NEGATIVE
KETONES UA: NEGATIVE
LEUKOCYTES UA: NEGATIVE
Nitrite, UA: NEGATIVE
PH UA: 6
PROTEIN UA: NEGATIVE
RBC UA: NEGATIVE
Spec Grav, UA: 1.02
Urobilinogen, UA: 0.2

## 2015-11-14 LAB — TSH: TSH: 3.83 u[IU]/mL (ref 0.35–4.50)

## 2015-11-14 LAB — CBC
HCT: 35.9 % — ABNORMAL LOW (ref 36.0–46.0)
Hemoglobin: 11.5 g/dL — ABNORMAL LOW (ref 12.0–15.0)
MCHC: 32.2 g/dL (ref 30.0–36.0)
MCV: 76.4 fl — AB (ref 78.0–100.0)
Platelets: 331 10*3/uL (ref 150.0–400.0)
RBC: 4.69 Mil/uL (ref 3.87–5.11)
RDW: 18.7 % — AB (ref 11.5–15.5)
WBC: 10.4 10*3/uL (ref 4.0–10.5)

## 2015-11-14 LAB — HEMOGLOBIN A1C: HEMOGLOBIN A1C: 5.6 % (ref 4.6–6.5)

## 2015-11-14 MED ORDER — AMLODIPINE BESYLATE 5 MG PO TABS: 5.0000 mg | ORAL_TABLET | Freq: Every day | ORAL | Status: DC

## 2015-11-14 MED ORDER — PROMETHAZINE HCL 12.5 MG PO TABS: 12.5000 mg | ORAL_TABLET | Freq: Three times a day (TID) | ORAL | Status: DC | PRN

## 2015-11-14 MED ORDER — OMEPRAZOLE 20 MG PO CPDR: 20.0000 mg | DELAYED_RELEASE_CAPSULE | Freq: Every day | ORAL | Status: DC

## 2015-11-14 NOTE — Progress Notes (Signed)
Pre visit review using our clinic review tool, if applicable. No additional management support is needed unless otherwise documented below in the visit note. 

## 2015-11-14 NOTE — Patient Instructions (Addendum)
Nice to meet you. We are going to start her on amlodipine for your blood pressure. You should take this 1 pill a day. You need to check your blood pressure while on this. If it is greater than 180/110 please let us know. We are going to refer you to cardiology, neurology, and gastroenterology. We are also going to refer you to ophthalmology. You can take the Phenergan when you develop a headache. I would switch from ibuprofen to Tylenol. Please start a baby aspirin daily. If you develop chest pain, shortness of breath, palpitations, worsening headaches, numbness, weakness, vision changes, dizziness, inability to swallow, abdominal pain, fevers, or any new or change in symptoms please seek medical attention.

## 2015-11-16 ENCOUNTER — Encounter: Payer: Self-pay | Admitting: Family Medicine

## 2015-11-16 DIAGNOSIS — R51 Headache: Secondary | ICD-10-CM

## 2015-11-16 DIAGNOSIS — R079 Chest pain, unspecified: Secondary | ICD-10-CM | POA: Insufficient documentation

## 2015-11-16 DIAGNOSIS — R1310 Dysphagia, unspecified: Secondary | ICD-10-CM | POA: Insufficient documentation

## 2015-11-16 DIAGNOSIS — R519 Headache, unspecified: Secondary | ICD-10-CM | POA: Insufficient documentation

## 2015-11-16 DIAGNOSIS — I10 Essential (primary) hypertension: Secondary | ICD-10-CM | POA: Insufficient documentation

## 2015-11-16 NOTE — Progress Notes (Signed)
Patient ID: Joann White, female   DOB: [birth year], 40 y.o.   MRN: [id number]  Tommi Rumps, MD Phone: [phone]  Joann White is a 40 y.o. female who presents today for new patient visit.  Hypertension: Patient notes she recently went to the dentist for dental work and they advised her that she could not have this done due to her blood pressure being elevated. She notes since then her blood pressure has typically been in the 145/110-155/115 range. She does note an intermittent central chest pain as well. This will be outlined below. She additionally notes some shortness of breath and palpitations.  Chest pain: Patient notes for a number of months having intermittent sharp central chest pain a few times a week. She gets short of breath with this. Is not exertional. There is no diaphoresis with this. There is no radiation. She does have some intermittent palpitations as well. She feels anxious during this, though is unsure if this is anxiety provoked or the anxiety is result of the symptoms she is feeling. She's had no unilateral leg swelling. She does note some intermittent edema in her ankles bilaterally. No recent surgeries or trips. No history of DVT or PE. No history of cholesterol issues. No history of diabetes. She does note polyuria and polydipsia. She has a family history of cardiovascular disease in the maternal grandfather who had an MI at age 60 and then a cousin who had a stroke at age 65 and MI as well. She also had an uncle who had a stroke.  Headaches: Patient notes daily bitemporal sharp headaches. She notes when she gets these she feels overall weak, though has no focal weakness. She notes intermittent numbness alternating between her arms sometimes with her headaches. She notes she occasionally does get a dizzy sensation with that that she describes as vertigo and intermittent lightheadedness. She had some photophobia with it. No phonophobia. She notes sometimes her headaches  can last 2-3 days and when this occurs her vision may get blurry, though she reports her vision is intermittently blurry at times when she does not have headache. The headaches do not wake her from sleep. She has had headaches intermittently for 2-3 years. She has taken Benadryl, ibuprofen, aspirin, and Flexeril intermittently with some benefit for her headaches.  Swallowing difficulty: Patient notes intermittently feeling as though she gets food stuck in her throat. She notes it feels like the reflex to swallow goes away. Last for a few minutes at a time. She has to either regurgitate the food or drinks something to get it to go down. No reflux, sour taste, or burning. Notes this started in 2009. No abdominal pain.  The patient does not have chest pain, shortness of breath, numbness, weakness, vision changes, trouble swallowing, or palpitations at this time. She notes mild headache at this time.  Active Ambulatory Problems    Diagnosis Date Noted  . Essential hypertension 11/16/2015  . Chest pain 11/16/2015  . Headache 11/16/2015  . Swallowing difficulty 11/16/2015   Resolved Ambulatory Problems    Diagnosis Date Noted  . No Resolved Ambulatory Problems   Past Medical History  Diagnosis Date  . COPD (chronic obstructive pulmonary disease) ([place])   . Chickenpox   . Allergic rhinitis     Family History  Problem Relation Age of Onset  . Alcoholism Father   . Arthritis      Parent, grandparent  . Breast cancer Paternal Aunt   . Lung cancer Father   .  Lung cancer Paternal Aunt   . Heart disease      Parent, grandparent  . Stroke Paternal Barbaraann Rondo     First cousin as well  . Hypertension      Parent, grandparent  . Diabetes      Maternal grandmother and grandfather     Social History   Social History  . Marital Status: Married    Spouse Name: N/A  . Number of Children: N/A  . Years of Education: N/A   Occupational History  . Not on file.   Social History Main Topics  .  Smoking status: Current Every Day Smoker    Types: Cigarettes  . Smokeless tobacco: Not on file  . Alcohol Use: 0.0 oz/week    0 Standard drinks or equivalent per week     Comment: rarely  . Drug Use: Yes    Special: Marijuana  . Sexual Activity: Not on file   Other Topics Concern  . Not on file   Social History Narrative    ROS   General:  Negative for nexplained weight loss, fever Skin: Negative for new or changing mole, sore that won't heal HEENT: Positive for trouble seeing, ringing in her ears, trouble swallowing Negative for trouble hearing, mouth sores, hoarseness, change in voice, dysphagia. CV:  Positive for chest pain, dyspnea, edema, palpitations Resp: Positive for dyspnea, Negative for cough, hemoptysis GI: Positive for nausea, Negative for vomiting, diarrhea, constipation, abdominal pain, melena, hematochezia. GU: Positive for frequent urination, urinary hesitancy, Negative for dysuria, incontinence, hematuria, vaginal or penile discharge, polyuria, sexual difficulty, lumps in testicle or breasts MSK: Positive for muscle aches, Negative for muscle cramps, joint pain or swelling Neuro: Positive for headaches, weakness, dizziness, Negative for numbness, passing out/fainting Psych: Positive for anxiety, Negative for depression, memory problems  Objective  Physical Exam Filed Vitals:   11/14/15 0902  BP: 154/98  Pulse: 99  Temp: 98.6 F (37 C)   Laying blood pressure 162/108 pulse 92 Sitting blood pressure 160/106 pulse 102 Standing blood pressure 148/100 pulse 108  Physical Exam  Constitutional: She is well-developed, well-nourished, and in no distress.  HENT:  Head: Normocephalic and atraumatic.  Right Ear: External ear normal.  Left Ear: External ear normal.  Mouth/Throat: Oropharynx is clear and moist. No oropharyngeal exudate.  Eyes: Conjunctivae are normal. Pupils are equal, round, and reactive to light.  Neck: Neck supple.  Cardiovascular: Normal  rate, regular rhythm and normal heart sounds.  Exam reveals no gallop and no friction rub.   No murmur heard. Pulmonary/Chest: Effort normal and breath sounds normal. No respiratory distress. She has no wheezes. She has no rales.  Abdominal: Soft. Bowel sounds are normal. She exhibits no distension. There is no tenderness. There is no rebound and no guarding.  Musculoskeletal: She exhibits no edema.  Lymphadenopathy:    She has no cervical adenopathy.  Neurological: She is alert.  CN 2-12 intact, 5/5 strength in bilateral biceps, triceps, grip, quads, hamstrings, plantar and dorsiflexion, sensation to light touch intact in bilateral UE and LE, normal gait, 2+ patellar reflexes  Skin: Skin is warm and dry. She is not diaphoretic.  Psychiatric: Mood and affect normal.   EKG: Normal sinus rhythm, rate 97, no ST or T-wave changes  Assessment/Plan:   Essential hypertension BP elevated today in office. Also elevated at home per patient report and in dentist office. Given multiple blood pressures elevated we will start the patient on amlodipine. We will check screening lab work today.  Chest  pain Pain with typical and atypical aspects for cardiac cause of pain. EKG is reassuring. Doubt PE given duration, lack of risk factors, and stable vital signs. Could be MSK cause, though this would seem less likely. Doubt pulmonary cause given normal lung exam and stable oxygen saturation. Could be related to anxiety as well. Could be reflux related as well given difficulty swallowing. Given potential cardiac cause we will refer to cardiology for further evaluation. If it proves to be noncardiac would consider treatment of anxiety. Start on omeprazole for potential reflux cause. Given return precautions.  Headache Patient with chronic headache that is intermittent. Has some aspects of migrainous headaches, the location consistent with tension headaches. She has noted intermittent neurological issues with this  that could represent aura versus other neurological deficit. Given persistence of headaches with possible neurological intermittent abnormalities we will obtain an MRI of her brain. We will also refer her to neurology. We will refer to ophthalmology given vision changes with headache. She is neurologically intact today. We will give her a prescription for Phenergan if she develops headache with nausea. Given return precautions.  Swallowing difficulty Issue with intermittent difficulty swallowing food. No issue swallowing liquids. This could be related to reflux or stricture. Less likely could be a neurological issue. No issues with this today. We will start her on omeprazole. We will refer her to GI. She's given return precautions.    Orders Placed This Encounter  Procedures  . MR Brain Wo Contrast    Standing Status: Future     Number of Occurrences:      Standing Expiration Date: 01/11/2017    Order Specific Question:  Reason for Exam (SYMPTOM  OR DIAGNOSIS REQUIRED)    Answer:  daily persistent headaches with scattered numbness    Order Specific Question:  Preferred imaging location?    Answer:  [facility]    Order Specific Question:  Does the patient have a pacemaker or implanted devices?    Answer:  No    Order Specific Question:  What is the patient's sedation requirement?    Answer:  No Sedation  . CBC  . TSH  . Comp Met (CMET)  . Lipid Profile  . HgB A1c  . Ambulatory referral to Cardiology    Referral Priority:  Routine    Referral Type:  Consultation    Referral Reason:  Specialty Services Required    Requested Specialty:  Cardiology    Number of Visits Requested:  1  . Ambulatory referral to Neurology    Referral Priority:  Routine    Referral Type:  Consultation    Referral Reason:  Specialty Services Required    Requested Specialty:  Neurology    Number of Visits Requested:  1  . Ambulatory referral to Gastroenterology    Referral Priority:  Routine     Referral Type:  Consultation    Referral Reason:  Specialty Services Required    Number of Visits Requested:  1  . Ambulatory referral to Ophthalmology    Referral Priority:  Routine    Referral Type:  Consultation    Referral Reason:  Specialty Services Required    Requested Specialty:  Ophthalmology    Number of Visits Requested:  1  . POCT Urinalysis Dipstick  . EKG 12-Lead    Meds ordered this encounter  Medications  . amLODipine (NORVASC) 5 MG tablet    Sig: Take 1 tablet (5 mg total) by mouth daily.    Dispense:  90 tablet  Refill:  0  . promethazine (PHENERGAN) 12.5 MG tablet    Sig: Take 1 tablet (12.5 mg total) by mouth every 8 (eight) hours as needed for nausea or vomiting.    Dispense:  20 tablet    Refill:  0  . omeprazole (PRILOSEC) 20 MG capsule    Sig: Take 1 capsule (20 mg total) by mouth daily.    Dispense:  30 capsule    Refill:  3    Dragon voice recognition software was used during the dictation process of this note. If any phrases or words seem inappropriate it is likely secondary to the translation process being inefficient.  Tommi Rumps

## 2015-11-16 NOTE — Assessment & Plan Note (Addendum)
Pain with typical and atypical aspects for cardiac cause of pain. EKG is reassuring. Doubt PE given duration, lack of risk factors, and stable vital signs. Could be MSK cause, though this would seem less likely. Doubt pulmonary cause given normal lung exam and stable oxygen saturation. Could be related to anxiety as well. Could be reflux related as well given difficulty swallowing. Given potential cardiac cause we will refer to cardiology for further evaluation. If it proves to be noncardiac would consider treatment of anxiety. Start on omeprazole for potential reflux cause. Given return precautions.

## 2015-11-16 NOTE — Assessment & Plan Note (Signed)
BP elevated today in office. Also elevated at home per patient report and in dentist office. Given multiple blood pressures elevated we will start the patient on amlodipine. We will check screening lab work today.

## 2015-11-16 NOTE — Assessment & Plan Note (Signed)
Issue with intermittent difficulty swallowing food. No issue swallowing liquids. This could be related to reflux or stricture. Less likely could be a neurological issue. No issues with this today. We will start her on omeprazole. We will refer her to GI. She's given return precautions.

## 2015-11-16 NOTE — Assessment & Plan Note (Addendum)
Patient with chronic headache that is intermittent. Has some aspects of migrainous headaches, the location consistent with tension headaches. She has noted intermittent neurological issues with this that could represent aura versus other neurological deficit. Given persistence of headaches with possible neurological intermittent abnormalities we will obtain an MRI of her brain. We will also refer her to neurology. We will refer to ophthalmology given vision changes with headache. She is neurologically intact today. We will give her a prescription for Phenergan if she develops headache with nausea. Given return precautions.

## 2015-11-17 ENCOUNTER — Ambulatory Visit: Payer: BLUE CROSS/BLUE SHIELD | Admitting: Neurology

## 2015-11-21 ENCOUNTER — Encounter: Payer: Self-pay | Admitting: Family Medicine

## 2015-11-21 ENCOUNTER — Telehealth: Payer: Self-pay | Admitting: Family Medicine

## 2015-11-21 ENCOUNTER — Ambulatory Visit (INDEPENDENT_AMBULATORY_CARE_PROVIDER_SITE_OTHER): Payer: BLUE CROSS/BLUE SHIELD | Admitting: Cardiovascular Disease

## 2015-11-21 ENCOUNTER — Encounter: Payer: Self-pay | Admitting: Cardiovascular Disease

## 2015-11-21 ENCOUNTER — Ambulatory Visit (INDEPENDENT_AMBULATORY_CARE_PROVIDER_SITE_OTHER): Payer: BLUE CROSS/BLUE SHIELD | Admitting: Family Medicine

## 2015-11-21 VITALS — BP 130/88 | HR 105 | Ht 66.0 in | Wt 190.2 lb

## 2015-11-21 VITALS — BP 130/84 | HR 99 | Temp 98.8°F | Ht 66.0 in | Wt 192.0 lb

## 2015-11-21 DIAGNOSIS — R0602 Shortness of breath: Secondary | ICD-10-CM | POA: Diagnosis not present

## 2015-11-21 DIAGNOSIS — Z3490 Encounter for supervision of normal pregnancy, unspecified, unspecified trimester: Secondary | ICD-10-CM

## 2015-11-21 DIAGNOSIS — I10 Essential (primary) hypertension: Secondary | ICD-10-CM | POA: Diagnosis not present

## 2015-11-21 DIAGNOSIS — R079 Chest pain, unspecified: Secondary | ICD-10-CM | POA: Diagnosis not present

## 2015-11-21 DIAGNOSIS — N926 Irregular menstruation, unspecified: Secondary | ICD-10-CM | POA: Diagnosis not present

## 2015-11-21 DIAGNOSIS — R51 Headache: Secondary | ICD-10-CM

## 2015-11-21 DIAGNOSIS — R519 Headache, unspecified: Secondary | ICD-10-CM

## 2015-11-21 DIAGNOSIS — R000 Tachycardia, unspecified: Secondary | ICD-10-CM | POA: Diagnosis not present

## 2015-11-21 DIAGNOSIS — G8929 Other chronic pain: Secondary | ICD-10-CM

## 2015-11-21 DIAGNOSIS — Z720 Tobacco use: Secondary | ICD-10-CM | POA: Insufficient documentation

## 2015-11-21 LAB — HCG, QUANTITATIVE, PREGNANCY: QUANTITATIVE HCG: 173.72 m[IU]/mL

## 2015-11-21 LAB — POCT URINE PREGNANCY: Preg Test, Ur: POSITIVE — AB

## 2015-11-21 MED ORDER — MOMETASONE FUROATE 50 MCG/ACT NA SUSP: 2.0000 | Freq: Every day | NASAL | Status: DC

## 2015-11-21 MED ORDER — LORATADINE 10 MG PO TABS: 10.0000 mg | ORAL_TABLET | Freq: Every day | ORAL | Status: DC

## 2015-11-21 MED ORDER — FERROUS SULFATE 325 (65 FE) MG PO TABS: 325.0000 mg | ORAL_TABLET | Freq: Every day | ORAL | Status: DC

## 2015-11-21 NOTE — Progress Notes (Signed)
Patient ID: Joann White, female   DOB: 1976/01/03, 40 y.o.   MRN: [id number]  Tommi Rumps, MD Phone: [phone]  Joann White is a 40 y.o. female who presents today for follow-up.  HYPERTENSION Disease Monitoring Home BP Monitoring checking, notes ranges systolics is [id number], diastolics 123456 Chest pain- no    Dyspnea- yes, patient notes a couple times a day she feels mildly short of breath. Can occur when walking and sitting. No diaphoresis with it. She does note some fluttering of her heart with it. Medications Compliance-  taking amlodipine. Lightheadedness-  yes intermittently gets lightheaded when changing positions and while sitting there.  Edema- no  Headaches: These are unchanged. They occur intermittently. No nausea, vomiting, numbness, or weakness. She does note intermittent vision changes that she describes as blurriness with the headaches and without the headaches. Vision check last week was normal. Her vision is unchanged. No vision issues at this time. She describes the headaches as a pressure sensation in her head that moves from one side to the other. She does note having sinus issues in the past with rhinorrhea and drainage. No fevers. She's tried Claritin, Benadryl, Nasonex, Sudafed, and Tylenol all at separate times. None are beneficial on their own.  Patient additionally notes that she is late for her menstrual period. She notes her last period was on December 6. She typically has 1 period a month that lasts for same duration of days every month. Has not ever had irregular periods. She is sexually active with her husband. Does not use birth control.  PMH: Smoker.   ROS see HPI  Objective  Physical Exam Filed Vitals:   11/21/15 1005  BP: 130/84  Pulse: 99  Temp: 98.8 F (37.1 C)   Laying blood pressure 132/94 pulse 99 Sitting blood pressure 148/102 pulse 106 Standing blood pressure 124/96 pulse 115  Physical Exam  Constitutional: She is  well-developed, well-nourished, and in no distress.  HENT:  Head: Normocephalic and atraumatic.  Right Ear: External ear normal.  Left Ear: External ear normal.  Mouth/Throat: Oropharynx is clear and moist. No oropharyngeal exudate.  Eyes: Conjunctivae are normal. Pupils are equal, round, and reactive to light.  Neck: Neck supple.  Cardiovascular: Normal rate, regular rhythm and normal heart sounds.  Exam reveals no gallop and no friction rub.   No murmur heard. Pulmonary/Chest: Effort normal and breath sounds normal. No respiratory distress. She has no wheezes. She has no rales.  Musculoskeletal: She exhibits no edema.  Lymphadenopathy:    She has no cervical adenopathy.  Neurological: She is alert.  CN 2-12 intact, 5/5 strength in bilateral biceps, triceps, grip, quads, hamstrings, plantar and dorsiflexion, sensation to light touch intact in bilateral UE and LE, normal gait, 2+ patellar reflexes  Skin: Skin is warm and dry. She is not diaphoretic.     Assessment/Plan: Please see individual problem list.  Essential hypertension At goal today. We'll continue current medication. She'll continue to monitor at home.  Headache Continues to have headaches. Today she does note a number of sinus and upper respiratory issues intermittently that she associates with her headaches. Could be related to allergies, tension headaches, or migraines. She has tried a number of medications, though no medications in combination. We will start her on Claritin and Nasonex. We'll proceed with MRI brain and consult from neurology. Neurologically intact. She is given return precautions.  Missed period Patient notes she is about a week late for her last period. She is sexually active and  does not use birth control. We'll check urine pregnancy test and quantitative beta hCG. Patient has an appointment to get to in the next 15 minutes and thus we will call her with the results.    Orders Placed This Encounter    Procedures  . B-HCG Quant  . POCT urine pregnancy    Meds ordered this encounter  Medications  . loratadine (CLARITIN) 10 MG tablet    Sig: Take 1 tablet (10 mg total) by mouth daily.    Dispense:  30 tablet    Refill:  11  . mometasone (NASONEX) 50 MCG/ACT nasal spray    Sig: Place 2 sprays into the nose daily.    Dispense:  17 g    Refill:  12  . ferrous sulfate 325 (65 FE) MG tablet    Sig: Take 1 tablet (325 mg total) by mouth daily with breakfast.    Dispense:  90 tablet    Refill:  3     Tommi Rumps

## 2015-11-21 NOTE — Patient Instructions (Addendum)
Medication Instructions:  Your physician recommends that you continue on your current medications as directed. Please refer to the Current Medication list given to you today.   Labwork: none  Testing/Procedures: Your physician has requested that you have an echocardiogram. Echocardiography is a painless test that uses sound waves to create images of your heart. It provides your doctor with information about the size and shape of your heart and how well your heart's chambers and valves are working. This procedure takes approximately one hour. There are no restrictions for this procedure.  Your physician has requested that you have an exercise tolerance test. For further information please visit [email or web address]. Please also follow instruction sheet, as given. BRING YOUR INHALER TO THIS TEST.    Follow-Up: Your physician recommends that you schedule a follow-up appointment as needed.    Any Other Special Instructions Will Be Listed Below (If Applicable).     If you need a refill on your cardiac medications before your next appointment, please call your pharmacy.  Echocardiogram An echocardiogram, or echocardiography, uses sound waves (ultrasound) to produce an image of your heart. The echocardiogram is simple, painless, obtained within a short period of time, and offers valuable information to your health care provider. The images from an echocardiogram can provide information such as:  Evidence of coronary artery disease (CAD).  Heart size.  Heart muscle function.  Heart valve function.  Aneurysm detection.  Evidence of a past heart attack.  Fluid buildup around the heart.  Heart muscle thickening.  Assess heart valve function. LET [facility] CARE PROVIDER KNOW ABOUT:  Any allergies you have.  All medicines you are taking, including vitamins, herbs, eye drops, creams, and over-the-counter medicines.  Previous problems you or members of your family have had with  the use of anesthetics.  Any blood disorders you have.  Previous surgeries you have had.  Medical conditions you have.  Possibility of pregnancy, if this applies. BEFORE THE PROCEDURE  No special preparation is needed. Eat and drink normally.  PROCEDURE   In order to produce an image of your heart, gel will be applied to your chest and a wand-like tool (transducer) will be moved over your chest. The gel will help transmit the sound waves from the transducer. The sound waves will harmlessly bounce off your heart to allow the heart images to be captured in real-time motion. These images will then be recorded.  You may need an IV to receive a medicine that improves the quality of the pictures. AFTER THE PROCEDURE You may return to your normal schedule including diet, activities, and medicines, unless your health care provider tells you otherwise.   This information is not intended to replace advice given to you by your health care provider. Make sure you discuss any questions you have with your health care provider.   Document Released: 10/22/2000 Document Revised: 11/15/2014 Document Reviewed: 07/02/2013 Elsevier Interactive Patient Education 2016 [employer]. Exercise Stress Electrocardiogram An exercise stress electrocardiogram is a test that is done to evaluate the blood supply to your heart. This test may also be called exercise stress electrocardiography. The test is done while you are walking on a treadmill. The goal of this test is to raise your heart rate. This test is done to find areas of poor blood flow to the heart by determining the extent of coronary artery disease (CAD).   CAD is defined as narrowing in one or more heart (coronary) arteries of more than 70%. If you have an abnormal  test result, this may mean that you are not getting adequate blood flow to your heart during exercise. Additional testing may be needed to understand why your test was abnormal. LET [facility]  CARE PROVIDER KNOW ABOUT:   Any allergies you have.  All medicines you are taking, including vitamins, herbs, eye drops, creams, and over-the-counter medicines.  Previous problems you or members of your family have had with the use of anesthetics.  Any blood disorders you have.  Previous surgeries you have had.  Medical conditions you have.  Possibility of pregnancy, if this applies. RISKS AND COMPLICATIONS Generally, this is a safe procedure. However, as with any procedure, complications can occur. Possible complications can include:  Pain or pressure in the following areas:  Chest.  Jaw or neck.  Between your shoulder blades.  Radiating down your left arm.  Dizziness or light-headedness.  Shortness of breath.  Increased or irregular heartbeats.  Nausea or vomiting.  Heart attack (rare). BEFORE THE PROCEDURE  Avoid all forms of caffeine 24 hours before your test or as directed by your health care provider. This includes coffee, tea (even decaffeinated tea), caffeinated sodas, chocolate, cocoa, and certain pain medicines.  Follow your health care provider's instructions regarding eating and drinking before the test.  Take your medicines as directed at regular times with water unless instructed otherwise. Exceptions may include:  If you have diabetes, ask how you are to take your insulin or pills. It is common to adjust insulin dosing the morning of the test.  If you are taking beta-blocker medicines, it is important to talk to your health care provider about these medicines well before the date of your test. Taking beta-blocker medicines may interfere with the test. In some cases, these medicines need to be changed or stopped 24 hours or more before the test.  If you wear a nitroglycerin patch, it may need to be removed prior to the test. Ask your health care provider if the patch should be removed before the test.  If you use an inhaler for any breathing condition,  bring it with you to the test.  If you are an outpatient, bring a snack so you can eat right after the stress phase of the test.  Do not smoke for 4 hours prior to the test or as directed by your health care provider.  Do not apply lotions, powders, creams, or oils on your chest prior to the test.  Wear loose-fitting clothes and comfortable shoes for the test. This test involves walking on a treadmill. PROCEDURE  Multiple patches (electrodes) will be put on your chest. If needed, small areas of your chest may have to be shaved to get better contact with the electrodes. Once the electrodes are attached to your body, multiple wires will be attached to the electrodes and your heart rate will be monitored.  Your heart will be monitored both at rest and while exercising.  You will walk on a treadmill. The treadmill will be started at a slow pace. The treadmill speed and incline will gradually be increased to raise your heart rate. AFTER THE PROCEDURE  Your heart rate and blood pressure will be monitored after the test.  You may return to your normal schedule including diet, activities, and medicines, unless your health care provider tells you otherwise.   This information is not intended to replace advice given to you by your health care provider. Make sure you discuss any questions you have with your health care provider.  Document Released: 10/22/2000 Document Revised: 10/30/2013 Document Reviewed: 07/02/2013 Elsevier Interactive Patient Education [employer].

## 2015-11-21 NOTE — Patient Instructions (Signed)
Nice to see you. Regards check pregnancy test prior to obtaining an x-ray. We'll wait for the blood test to come back first. Please start on iron. Please start on Claritin and Nasonex.  You need to drink plenty of water to stay well hydrated as it appears that your blood pressure drops when sitting up.  Laying blood pressure 132/94 pulse 99 Sitting blood pressure 148/102 pulse 106 Standing blood pressure 124/96 pulse 115  Please keep your specialty follow-up appointments. Please keep your appointment for your MRI. If you develop chest pain, shortness of breath, headaches, numbness, weakness, vision change, persistent lightheadedness, palpitations, or any new change in symptoms please seek medical attention.

## 2015-11-21 NOTE — Assessment & Plan Note (Signed)
I discussed with her the importance of smoking cessation and she determined to quit smoking.

## 2015-11-21 NOTE — Telephone Encounter (Signed)
Called and spoke with patient. Advised that her beta hCG was positive. This indicates that she is pregnant. She has seen and OB/GYN in [place] previously. We will refer her there. Went through her medications with her. Advised her that she should only continue the Claritin and iron at this time. Discussed her blood pressure medication at length with her. It appears that amlodipine does not have a lot of data in pregnancy, though it appears the calcium channel blockers are in general well-tolerated. I discussed the option of continuing amlodipine and seeing her OB/GYN versus changing medications. She has a history of COPD and this is not a good candidate for labetalol. Discussed possibly changing to a different calcium channel blocker called nifedipine, though patient opted to continue her amlodipine and see her OB. She was advised to stop her other medications and she was advised to avoid NSAIDs. She's not had any abdominal pain. She is also advised of precautions to the seek medical attention.

## 2015-11-21 NOTE — Assessment & Plan Note (Signed)
The chest pain is atypical and certainly could be due to stress and anxiety. She might be having panic attacks but nonetheless this is a diagnosis of exclusion. Recent thyroid function was normal. She also reports significant associated shortness of breath. Thus, I requested a treadmill stress test and echocardiogram.  If cardiac workup is unremarkable, I agree with treatment for possible underlying anxiety disorder.

## 2015-11-21 NOTE — Progress Notes (Signed)
Primary care physician: Dr. Caryl Bis  HPI  This is a pleasant 40 year old female who was referred by Dr. Caryl Bis for evaluation of chest pain and shortness of breath. She has no previous cardiac history. She has known history of hypertension and was recently started on amlodipine. She is also a smoker and currently smokes half a pack per day but she is trying to quit. She was told in the past about mild COPD. She is not diabetic. She has family history of hypertension and coronary artery disease .  Over the last 3 months, she has experienced intermittent episodes of palpitations associated with substernal chest tightness, anxious feeling, shortness of breath and dizziness. She has been feeling extremely anxious. She is under stress at home. The symptoms do not get worse with physical activities. No previous cardiac evaluation. Recent labs were overall unremarkable except for mild anemia. Thyroid function was normal.  Allergies  Allergen Reactions  . Hydromorphone Nausea And Vomiting    Dizziness      Current Outpatient Prescriptions on File Prior to Visit  Medication Sig Dispense Refill  . albuterol (PROVENTIL HFA;VENTOLIN HFA) 108 (90 BASE) MCG/ACT inhaler Inhale 2 puffs into the lungs every 6 (six) hours as needed. Reported on 11/14/2015    . amLODipine (NORVASC) 5 MG tablet Take 1 tablet (5 mg total) by mouth daily. 90 tablet 0  . Cyanocobalamin (VITAMIN B-12 PO) Take 1 tablet by mouth daily. Reported on 11/14/2015    . ferrous sulfate 325 (65 FE) MG tablet Take 1 tablet (325 mg total) by mouth daily with breakfast. 90 tablet 3  . loratadine (CLARITIN) 10 MG tablet Take 1 tablet (10 mg total) by mouth daily. 30 tablet 11  . mometasone (NASONEX) 50 MCG/ACT nasal spray Place 2 sprays into the nose daily. 17 g 12  . naproxen sodium (ALEVE) 220 MG tablet Take 220 mg by mouth 2 (two) times daily as needed. Reported on 11/14/2015    . omeprazole (PRILOSEC) 20 MG capsule Take 1 capsule (20 mg  total) by mouth daily. 30 capsule 3  . promethazine (PHENERGAN) 12.5 MG tablet Take 1 tablet (12.5 mg total) by mouth every 8 (eight) hours as needed for nausea or vomiting. 20 tablet 0   No current facility-administered medications on file prior to visit.     Past Medical History  Diagnosis Date  . COPD (chronic obstructive pulmonary disease) ([place])   . Chickenpox   . Headache   . Allergic rhinitis      History reviewed. No pertinent past surgical history.   Family History  Problem Relation Age of Onset  . Alcoholism Father   . Lung cancer Father   . Arthritis      Parent, grandparent  . Breast cancer Paternal Aunt   . Lung cancer Paternal Aunt   . Heart disease      Parent, grandparent  . Stroke Paternal Barbaraann Rondo     First cousin as well  . Hypertension      Parent, grandparent  . Diabetes      Maternal grandmother and grandfather      Social History   Social History  . Marital Status: Married    Spouse Name: N/A  . Number of Children: N/A  . Years of Education: N/A   Occupational History  . Not on file.   Social History Main Topics  . Smoking status: Current Every Day Smoker -- 0.50 packs/day for 18 years    Types: Cigarettes  . Smokeless tobacco: Not  on file  . Alcohol Use: 0.0 oz/week    0 Standard drinks or equivalent per week     Comment: rarely  . Drug Use: Yes    Special: Marijuana  . Sexual Activity: Not on file   Other Topics Concern  . Not on file   Social History Narrative     ROS A 10 point review of system was performed. It is negative other than that mentioned in the history of present illness.   PHYSICAL EXAM   BP 130/88 mmHg  Pulse 105  Ht 5\' 6"  (1.676 m)  Wt 190 lb 4 oz (86.297 kg)  BMI 30.72 kg/m2  LMP 10/14/2015 Constitutional: She is oriented to person, place, and time. She appears well-developed and well-nourished. No distress.  HENT: No nasal discharge.  Head: Normocephalic and atraumatic.  Eyes: Pupils are equal  and round. No discharge.  Neck: Normal range of motion. Neck supple. No JVD present. No thyromegaly present.  Cardiovascular: Normal rate, regular rhythm, normal heart sounds. Exam reveals no gallop and no friction rub. No murmur heard.  Pulmonary/Chest: Effort normal and breath sounds normal. No stridor. No respiratory distress. She has no wheezes. She has no rales. She exhibits no tenderness.  Abdominal: Soft. Bowel sounds are normal. She exhibits no distension. There is no tenderness. There is no rebound and no guarding.  Musculoskeletal: Normal range of motion. She exhibits no edema and no tenderness.  Neurological: She is alert and oriented to person, place, and time. Coordination normal.  Skin: Skin is warm and dry. No rash noted. She is not diaphoretic. No erythema. No pallor.  Psychiatric: She has a normal mood and affect. Her behavior is normal. Judgment and thought content normal.     EKG: Sinus tachycardia with nonspecific ST and T wave changes.   ASSESSMENT AND PLAN

## 2015-11-21 NOTE — Assessment & Plan Note (Signed)
She seems to be bothered by palpitations. Consider switching amlodipine to diltiazem in the future if she continues to be symptomatic.

## 2015-11-21 NOTE — Progress Notes (Signed)
Pre visit review using our clinic review tool, if applicable. No additional management support is needed unless otherwise documented below in the visit note. 

## 2015-11-23 NOTE — Assessment & Plan Note (Signed)
At goal today. We'll continue current medication. She'll continue to monitor at home.

## 2015-11-23 NOTE — Assessment & Plan Note (Signed)
Patient notes she is about a week late for her last period. She is sexually active and does not use birth control. We'll check urine pregnancy test and quantitative beta hCG. Patient has an appointment to get to in the next 15 minutes and thus we will call her with the results.

## 2015-11-23 NOTE — Assessment & Plan Note (Signed)
Continues to have headaches. Today she does note a number of sinus and upper respiratory issues intermittently that she associates with her headaches. Could be related to allergies, tension headaches, or migraines. She has tried a number of medications, though no medications in combination. We will start her on Claritin and Nasonex. We'll proceed with MRI brain and consult from neurology. Neurologically intact. She is given return precautions.

## 2015-11-24 ENCOUNTER — Telehealth: Payer: Self-pay

## 2015-11-24 ENCOUNTER — Ambulatory Visit: Payer: BLUE CROSS/BLUE SHIELD | Admitting: Gastroenterology

## 2015-11-24 NOTE — Telephone Encounter (Addendum)
Patient reports she went to [facility] hospital over the weekend. She notes she started having spotting on Saturday and called the after hours line and they advised her to monitor this though if it worsened to go to the ED. It worsened on Sunday and she went for evaluation. States she had a transvaginal US and pelvic US and they were unable to see a pregnancy though advised her that it may be too early on to detect a pregnancy. She notes they advised her to follow-up in 2 days for re-evaluation. Notes she has continued to bleed and it is now like a regular period. No abdominal pain. Has not gotten an appointment set up with OB yet. Advised that we would try to get her follow-up with OB tomorrow and if we can't get her in she will need to follow-up with [name] for follow-up lab work and [name]. She was given return precautions.

## 2015-11-24 NOTE — Telephone Encounter (Signed)
[facility] call this weekend, advised her to go to the ED due this reason.  On 11/23/15.

## 2015-11-24 NOTE — Telephone Encounter (Signed)
Pt called and stated that she wanted Dr. Caryl Bis to know she was seen in the ER over the weekend because she started bleeding after her appt in the office confirmed her pregnancy. She stated that the hospital is not sure if she miscarried,  And she has to go for further evaluation./tvw

## 2015-11-25 ENCOUNTER — Other Ambulatory Visit: Payer: Self-pay | Admitting: *Deleted

## 2015-11-25 DIAGNOSIS — O3680X0 Pregnancy with inconclusive fetal viability, not applicable or unspecified: Secondary | ICD-10-CM

## 2015-11-26 ENCOUNTER — Telehealth: Payer: Self-pay | Admitting: *Deleted

## 2015-11-26 LAB — HCG, BETA SUBUNIT, QN (SERIAL): HCG, BETA CHAIN, QUANT, S: 65 m[IU]/mL

## 2015-11-26 NOTE — Telephone Encounter (Signed)
Pt informed QHCG dropping, miscarriage per Knute Neu, CNM, repeat in 1 week. Pt verbalized understanding.

## 2015-12-02 ENCOUNTER — Encounter: Payer: Self-pay | Admitting: Family Medicine

## 2015-12-03 ENCOUNTER — Other Ambulatory Visit: Payer: Self-pay | Admitting: *Deleted

## 2015-12-03 DIAGNOSIS — O039 Complete or unspecified spontaneous abortion without complication: Secondary | ICD-10-CM

## 2015-12-04 LAB — BETA HCG QUANT (REF LAB): hCG Quant: 4 m[IU]/mL

## 2015-12-08 ENCOUNTER — Telehealth: Payer: Self-pay | Admitting: *Deleted

## 2015-12-08 ENCOUNTER — Encounter: Payer: Self-pay | Admitting: Neurology

## 2015-12-08 ENCOUNTER — Ambulatory Visit (INDEPENDENT_AMBULATORY_CARE_PROVIDER_SITE_OTHER): Payer: BLUE CROSS/BLUE SHIELD | Admitting: Neurology

## 2015-12-08 VITALS — BP 118/80 | HR 103 | Ht 66.0 in | Wt 198.2 lb

## 2015-12-08 DIAGNOSIS — H9193 Unspecified hearing loss, bilateral: Secondary | ICD-10-CM | POA: Diagnosis not present

## 2015-12-08 DIAGNOSIS — H539 Unspecified visual disturbance: Secondary | ICD-10-CM | POA: Diagnosis not present

## 2015-12-08 DIAGNOSIS — R51 Headache: Secondary | ICD-10-CM | POA: Diagnosis not present

## 2015-12-08 DIAGNOSIS — R519 Headache, unspecified: Secondary | ICD-10-CM

## 2015-12-08 MED ORDER — NORTRIPTYLINE HCL 10 MG PO CAPS: 10.0000 mg | ORAL_CAPSULE | Freq: Every day | ORAL | Status: DC

## 2015-12-08 NOTE — Telephone Encounter (Signed)
Pt QHCG 4 from 12/03/2015. Pt informed QHCG dropping as expected. Does pt need f/u?

## 2015-12-08 NOTE — Patient Instructions (Addendum)
1.  Start nortriptyline 10mg  at bedtime 2.  MRA head and neck 3.  Send me an update in 3 weeks 4.  You can also try magnesium oxide 400-600mg  daily  We will call you with the results  Return to clinic 6 weeks

## 2015-12-08 NOTE — Progress Notes (Signed)
[facility] Clinic Note - Initial Visit   Date: 12/08/2015  Joann White MRN: [id number] DOB: [birth year]   Dear Dr. Caryl Bis:  Thank you for your kind referral of Joann White for consultation of headaches. Although her history is well known to you, please allow [name] to reiterate it for the purpose of our medical record. The patient was accompanied to the clinic by self.   History of Present Illness: Joann White is a 40 y.o. right-handed [place] American female with COPD and current tobacco user (trying quit) presenting for evaluation of headaches.    Starting around 2010, she reports having bifrontal headaches, described as pounding and achy.  They were occuring daily until three weeks ago, when she started taking claritin and now gets them 3-5 times per week, lasting 60-min but tends to cluster within the same day.  She has not noticed any triggers.  She endorses photophobia, phonophobia, nausea, no vomiting.  No numbness/tingling or weakness.  She occasionally has blurry vision with the headaches.    About a year ago, she began having sharp pain in the head especially when coughing, sneezing, laughing, and straining. Associated with this, her hearing becomes muffled and she feels as if she can feel her heart beat in her ears.  It lasts around a minute.  Headaches do not wake her up from sleeping, but sometimes she does wake up with a headache. She has some baseline dizziness.    She has tried motrin, flexeril, tylenol, goody powder, sudafed, nasonex, and Allegra without benefit.  Benadryl would help ease her headaches.    She saw her PCP for headaches who offered trial of Claritin to see if symptoms are related to allergies, which helped the pressure in her head.     No family history of migraines.    Out-side paper records, electronic medical record, and images have been reviewed where available and summarized as:  Lab Results  Component  Value Date   TSH 3.83 11/14/2015   Lab Results  Component Value Date   HGBA1C 5.6 11/14/2015   CT head 2012:  normal  Past Medical History  Diagnosis Date  . COPD (chronic obstructive pulmonary disease) ([place])   . Chickenpox   . Headache   . Allergic rhinitis     No past surgical history on file.   Medications:  Outpatient Encounter Prescriptions as of 12/08/2015  Medication Sig  . albuterol (PROVENTIL HFA;VENTOLIN HFA) 108 (90 BASE) MCG/ACT inhaler Inhale 2 puffs into the lungs every 6 (six) hours as needed. Reported on 11/14/2015  . amLODipine (NORVASC) 5 MG tablet Take 1 tablet (5 mg total) by mouth daily.  . Cyanocobalamin (VITAMIN B-12 PO) Take 1 tablet by mouth daily. Reported on 11/14/2015  . ferrous sulfate 325 (65 FE) MG tablet Take 1 tablet (325 mg total) by mouth daily with breakfast.  . loratadine (CLARITIN) 10 MG tablet Take 1 tablet (10 mg total) by mouth daily.  . mometasone (NASONEX) 50 MCG/ACT nasal spray Place 2 sprays into the nose daily.  . naproxen sodium (ALEVE) 220 MG tablet Take 220 mg by mouth 2 (two) times daily as needed. Reported on 11/14/2015  . omeprazole (PRILOSEC) 20 MG capsule Take 1 capsule (20 mg total) by mouth daily.  . promethazine (PHENERGAN) 12.5 MG tablet Take 1 tablet (12.5 mg total) by mouth every 8 (eight) hours as needed for nausea or vomiting.   No facility-administered encounter medications on file as of 12/08/2015.  Allergies:  Allergies  Allergen Reactions  . Hydromorphone Nausea And Vomiting    Dizziness     Family History: Family History  Problem Relation Age of Onset  . Alcoholism Father   . Lung cancer Father   . Arthritis      Parent, grandparent  . Breast cancer Paternal Aunt   . Lung cancer Paternal Aunt   . Heart disease      Parent, grandparent  . Stroke Paternal Barbaraann Rondo     First cousin as well  . Hypertension      Parent, grandparent  . Diabetes      Maternal grandmother and grandfather     Social  History: Social History  Substance Use Topics  . Smoking status: Current Every Day Smoker -- 0.50 packs/day for 18 years    Types: Cigarettes  . Smokeless tobacco: None  . Alcohol Use: 0.0 oz/week    0 Standard drinks or equivalent per week     Comment: rarely   Social History   Social History Narrative    Review of Systems:  CONSTITUTIONAL: No fevers, chills, night sweats, or weight loss.   EYES: No visual changes or eye pain ENT: No hearing changes.  No history of nose bleeds.   RESPIRATORY: No cough, wheezing and shortness of breath.   CARDIOVASCULAR: Negative for chest pain, and palpitations.   GI: Negative for abdominal discomfort, blood in stools or black stools.  No recent change in bowel habits.   GU:  No history of incontinence.   MUSCLOSKELETAL: No history of joint pain or swelling.  No myalgias.   SKIN: Negative for lesions, rash, and itching.   HEMATOLOGY/ONCOLOGY: Negative for prolonged bleeding, bruising easily, and swollen nodes.  No history of cancer.   ENDOCRINE: Negative for cold or heat intolerance, polydipsia or goiter.   PSYCH:  No depression or anxiety symptoms.   NEURO: As Above.   Vital Signs:  BP 118/80 mmHg  Pulse 103  Ht 5\' 6"  (1.676 m)  Wt 198 lb 3 oz (89.897 kg)  BMI 32.00 kg/m2  SpO2 98%  LMP 10/14/2015 Pain Scale: 8 on a scale of 0-10   General Medical Exam:   General:  Well appearing, comfortable.   Eyes/ENT: see cranial nerve examination.   Neck: No masses appreciated.  Full range of motion without tenderness.  No carotid bruits. Respiratory:  Clear to auscultation, good air entry bilaterally.   Cardiac:  Regular rate and rhythm, no murmur.   Extremities:  No deformities, edema, or skin discoloration.  Skin:  No rashes or lesions.  Neurological Exam: MENTAL STATUS including orientation to time, place, person, recent and remote memory, attention span and concentration, language, and fund of knowledge is normal.  Speech is not  dysarthric.  CRANIAL NERVES: II:  No visual field defects.  Unremarkable fundi.   III-IV-VI: Pupils equal round and reactive to light.  Normal conjugate, extra-ocular eye movements in all directions of gaze.  No nystagmus.  No ptosis.   V:  Normal facial sensation.   VII:  Normal facial symmetry and movements.  No pathologic facial reflexes.  VIII:  Normal hearing and vestibular function.   IX-X:  Normal palatal movement.   XI:  Normal shoulder shrug and head rotation.   XII:  Normal tongue strength and range of motion, no deviation or fasciculation.  MOTOR:  No atrophy, fasciculations or abnormal movements.  No pronator drift.  Tone is normal.    Right Upper Extremity:    Left  Upper Extremity:    Deltoid  5/5   Deltoid  5/5   Biceps  5/5   Biceps  5/5   Triceps  5/5   Triceps  5/5   Wrist extensors  5/5   Wrist extensors  5/5   Wrist flexors  5/5   Wrist flexors  5/5   Finger extensors  5/5   Finger extensors  5/5   Finger flexors  5/5   Finger flexors  5/5   Dorsal interossei  5/5   Dorsal interossei  5/5   Abductor pollicis  5/5   Abductor pollicis  5/5   Tone (Ashworth scale)  0  Tone (Ashworth scale)  0   Right Lower Extremity:    Left Lower Extremity:    Hip flexors  5/5   Hip flexors  5/5   Hip extensors  5/5   Hip extensors  5/5   Knee flexors  5/5   Knee flexors  5/5   Knee extensors  5/5   Knee extensors  5/5   Dorsiflexors  5/5   Dorsiflexors  5/5   Plantarflexors  5/5   Plantarflexors  5/5   Toe extensors  5/5   Toe extensors  5/5   Toe flexors  5/5   Toe flexors  5/5   Tone (Ashworth scale)  0  Tone (Ashworth scale)  0   MSRs:  Right                                                                 Left brachioradialis 2+  brachioradialis 2+  biceps 2+  biceps 2+  triceps 2+  triceps 2+  patellar 2+  patellar 2+  ankle jerk 2+  ankle jerk 2+  Hoffman no  Hoffman no  plantar response down  plantar response down   SENSORY:  Normal and symmetric perception of  light touch, pinprick, vibration, and proprioception.  Romberg's sign absent.   COORDINATION/GAIT: Normal finger-to- nose-finger and heel-to-shin.  Intact rapid alternating movements bilaterally.  Able to rise from a chair without using arms.  Gait narrow based and stable. Tandem and stressed gait intact.    IMPRESSION/PLAN: Mrs. Julson is a 40 year-old female referred for evaluation of chronic daily headaches.  Her headaches although do have migrainous features, it is most concerning that her pain is triggered by valsalva maneuvers such as coughing, sneezing, laughing, and bearing down and therefore need to evaluate her brain and vessel status.  MRI brian has already been ordered for later this week and to that, I will add MRA head and neck.    In the meantime, nortriptyline 10mg  at bedtime will be started as a preventative agent.  She may also try magnesium 400-600mg  daily.  She recently had a miscarriage and still may decide to conceive again, so risks and benefits of nortriptyline were discussed.  Topiramate is contraindicated in pregnancy.   Return to clinic in 6 weeks.   The duration of this appointment visit was 40 minutes of face-to-face time with the patient.  Greater than 50% of this time was spent in counseling, explanation of diagnosis, planning of further management, and coordination of care.   Thank you for allowing me to participate in patient's care.  If I can answer any additional  questions, I would be pleased to do so.    Sincerely,    Donika K. Posey Pronto, DO

## 2015-12-09 ENCOUNTER — Encounter: Payer: Self-pay | Admitting: Family Medicine

## 2015-12-09 ENCOUNTER — Ambulatory Visit (INDEPENDENT_AMBULATORY_CARE_PROVIDER_SITE_OTHER): Payer: BLUE CROSS/BLUE SHIELD | Admitting: Family Medicine

## 2015-12-09 VITALS — BP 124/90 | HR 94 | Temp 97.9°F | Ht 66.0 in | Wt 193.5 lb

## 2015-12-09 DIAGNOSIS — R300 Dysuria: Secondary | ICD-10-CM | POA: Diagnosis not present

## 2015-12-09 LAB — POCT URINALYSIS DIPSTICK
BILIRUBIN UA: NEGATIVE
Blood, UA: NEGATIVE
GLUCOSE UA: NEGATIVE
KETONES UA: NEGATIVE
NITRITE UA: NEGATIVE
PH UA: 6
Protein, UA: NEGATIVE
Spec Grav, UA: 1.01
Urobilinogen, UA: 0.2

## 2015-12-09 MED ORDER — CEPHALEXIN 500 MG PO CAPS: 500.0000 mg | ORAL_CAPSULE | Freq: Two times a day (BID) | ORAL | Status: DC

## 2015-12-09 NOTE — Progress Notes (Signed)
Subjective:  Patient ID: Joann White, female    DOB: 1976-01-03  Age: 40 y.o. MRN: [id number]  CC: ? UTI  HPI:  40 year old female presents with concerns for UTI.  ? UTI  Patient reports a three-day history of painful urination.  She reports associated urinary frequency and difficulty urinating.  No associated fevers or chills.  No exacerbating or relieving factors.  Also reports lower abdominal pain.  No reports of flank pain, hematuria, nausea, vomiting.  Social Hx   Social History   Social History  . Marital Status: Married    Spouse Name: N/A  . Number of Children: N/A  . Years of Education: N/A   Social History Main Topics  . Smoking status: Current Every Day Smoker -- 0.50 packs/day for 18 years    Types: Cigarettes  . Smokeless tobacco: Never Used  . Alcohol Use: No     Comment: rarely  . Drug Use: No  . Sexual Activity: Not Asked   Other Topics Concern  . None   Social History Narrative   Lives with husband in a one story home.  Has one child.     Works as a Publishing copy.  Education: some college.   Review of Systems  Constitutional: Negative for fever.  Gastrointestinal: Positive for abdominal pain.  Genitourinary: Positive for dysuria and frequency. Negative for flank pain.   Objective:  BP 124/90 mmHg  Pulse 94  Temp(Src) 97.9 F (36.6 C) (Oral)  Ht 5\' 6"  (1.676 m)  Wt 193 lb 8 oz (87.771 kg)  BMI 31.25 kg/m2  SpO2 98%  LMP 10/14/2015  BP/Weight 12/09/2015 12/08/2015 [id number]  Systolic BP [id number]  Diastolic BP 90 80 88  Wt. (Lbs) 193.5 198.19 190.25  BMI 31.25 32 30.72   Physical Exam  Constitutional: She is oriented to person, place, and time. She appears well-developed. No distress.  Cardiovascular: Normal rate and regular rhythm.   Murmur heard.  Systolic murmur is present with a grade of 2/6  Pulmonary/Chest: Effort normal and breath sounds normal.  Abdominal: Soft.  Tender to palpation in the suprapubic  region. No rebound or guarding.  Neurological: She is alert and oriented to person, place, and time.  Vitals reviewed.  Lab Results  Component Value Date   WBC 10.4 11/14/2015   HGB 11.5* 11/14/2015   HCT 35.9* 11/14/2015   PLT 331.0 11/14/2015   GLUCOSE 95 11/14/2015   CHOL 145 11/14/2015   TRIG 142.0 11/14/2015   HDL 33.10* 11/14/2015   LDLCALC 83 11/14/2015   ALT 10 11/14/2015   AST 14 11/14/2015   NA 138 11/14/2015   K 3.8 11/14/2015   CL 105 11/14/2015   CREATININE 0.60 11/14/2015   BUN 8 11/14/2015   CO2 27 11/14/2015   TSH 3.83 11/14/2015   HGBA1C 5.6 11/14/2015   Results for orders placed or performed in visit on 12/09/15 (from the past 24 hour(s))  POCT Urinalysis Dipstick     Status: Abnormal   Collection Time: 12/09/15  4:27 PM  Result Value Ref Range   Color, UA Light yellow    Clarity, UA cloudy    Glucose, UA neg    Bilirubin, UA neg    Ketones, UA neg    Spec Grav, UA 1.010    Blood, UA neg    pH, UA 6.0    Protein, UA neg    Urobilinogen, UA 0.2    Nitrite, UA neg    Leukocytes,  UA Trace (A) Negative   Assessment & Plan:   Problem List Items Addressed This Visit    Dysuria - Primary    New problem. History consistent with UTI. However, urinalysis revealed only trace leukocytes. Will treat empirically for UTI with keflex while awaiting culture.       Relevant Orders   POCT Urinalysis Dipstick (Completed)   Urine culture      Meds ordered this encounter  Medications  . cephALEXin (KEFLEX) 500 MG capsule    Sig: Take 1 capsule (500 mg total) by mouth 2 (two) times daily.    Dispense:  10 capsule    Refill:  0    Follow-up: PRN  [place]

## 2015-12-09 NOTE — Assessment & Plan Note (Signed)
New problem. History consistent with UTI. However, urinalysis revealed only trace leukocytes. Will treat empirically for UTI with keflex while awaiting culture.

## 2015-12-09 NOTE — Patient Instructions (Signed)
Take the medication as prescribed.  We will call with the urine culture results.  Follow up as needed.  Take care  Dr. Lacinda Axon

## 2015-12-09 NOTE — Progress Notes (Signed)
Pre visit review using our clinic review tool, if applicable. No additional management support is needed unless otherwise documented below in the visit note. 

## 2015-12-10 LAB — URINE CULTURE: Colony Count: 3000

## 2015-12-12 ENCOUNTER — Ambulatory Visit
Admission: RE | Admit: 2015-12-12 | Discharge: 2015-12-12 | Disposition: A | Discharge status: Home / Self Care | Payer: BLUE CROSS/BLUE SHIELD | Source: Ambulatory Visit | Attending: Neurology | Admitting: Neurology

## 2015-12-12 ENCOUNTER — Encounter: Payer: Self-pay | Admitting: *Deleted

## 2015-12-12 ENCOUNTER — Telehealth: Payer: Self-pay | Admitting: Family Medicine

## 2015-12-12 ENCOUNTER — Ambulatory Visit: Payer: BLUE CROSS/BLUE SHIELD

## 2015-12-12 ENCOUNTER — Ambulatory Visit
Admission: RE | Admit: 2015-12-12 | Discharge: 2015-12-12 | Disposition: A | Discharge status: Home / Self Care | Payer: BLUE CROSS/BLUE SHIELD | Source: Ambulatory Visit | Attending: Family Medicine | Admitting: Family Medicine

## 2015-12-12 DIAGNOSIS — R519 Headache, unspecified: Secondary | ICD-10-CM

## 2015-12-12 DIAGNOSIS — G939 Disorder of brain, unspecified: Secondary | ICD-10-CM | POA: Insufficient documentation

## 2015-12-12 DIAGNOSIS — H9193 Unspecified hearing loss, bilateral: Secondary | ICD-10-CM | POA: Diagnosis present

## 2015-12-12 DIAGNOSIS — H539 Unspecified visual disturbance: Secondary | ICD-10-CM

## 2015-12-12 DIAGNOSIS — G8929 Other chronic pain: Secondary | ICD-10-CM

## 2015-12-12 DIAGNOSIS — R51 Headache: Secondary | ICD-10-CM | POA: Diagnosis not present

## 2015-12-12 MED ORDER — GADOBENATE DIMEGLUMINE 529 MG/ML IV SOLN
20.0000 mL | Freq: Once | INTRAVENOUS | Status: AC | PRN
  Administered 2015-12-12: 18 mL via INTRAVENOUS

## 2015-12-12 NOTE — Telephone Encounter (Signed)
Spoke with patient regarding results from MRI. 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 This could be the cause of her symptoms. Advised there were no masses or strokes. Advise of the vessels in her brain appeared normal. We'll forward the results to her neurologist to get their input. It may be that the patient needs to see a neurosurgeon or just continue to follow with neurology. She's given return precautions.

## 2015-12-24 ENCOUNTER — Ambulatory Visit: Payer: BLUE CROSS/BLUE SHIELD | Admitting: Family Medicine

## 2015-12-26 ENCOUNTER — Encounter: Payer: Self-pay | Admitting: Cardiovascular Disease

## 2015-12-30 ENCOUNTER — Ambulatory Visit: Payer: BLUE CROSS/BLUE SHIELD | Admitting: Cardiovascular Disease

## 2016-01-19 ENCOUNTER — Ambulatory Visit: Payer: BLUE CROSS/BLUE SHIELD | Admitting: Neurology

## 2016-02-05 ENCOUNTER — Other Ambulatory Visit: Payer: Self-pay | Admitting: Adult Health

## 2016-02-09 ENCOUNTER — Other Ambulatory Visit: Payer: Self-pay | Admitting: Family Medicine

## 2016-03-12 ENCOUNTER — Other Ambulatory Visit: Payer: Self-pay | Admitting: Family Medicine

## 2016-03-22 ENCOUNTER — Ambulatory Visit: Payer: BLUE CROSS/BLUE SHIELD | Admitting: Neurology

## 2016-03-22 DIAGNOSIS — Z029 Encounter for administrative examinations, unspecified: Secondary | ICD-10-CM

## 2016-03-29 ENCOUNTER — Emergency Department (HOSPITAL_COMMUNITY)
Admission: EM | Admit: 2016-03-29 | Discharge: 2016-03-29 | Disposition: A | Discharge status: Home / Self Care | Payer: BLUE CROSS/BLUE SHIELD | Attending: Emergency Medicine | Admitting: Emergency Medicine

## 2016-03-29 ENCOUNTER — Encounter (HOSPITAL_COMMUNITY): Payer: Self-pay | Admitting: Emergency Medicine

## 2016-03-29 DIAGNOSIS — S39012A Strain of muscle, fascia and tendon of lower back, initial encounter: Secondary | ICD-10-CM | POA: Diagnosis not present

## 2016-03-29 DIAGNOSIS — Z7982 Long term (current) use of aspirin: Secondary | ICD-10-CM | POA: Diagnosis not present

## 2016-03-29 DIAGNOSIS — Y999 Unspecified external cause status: Secondary | ICD-10-CM | POA: Insufficient documentation

## 2016-03-29 DIAGNOSIS — Y939 Activity, unspecified: Secondary | ICD-10-CM | POA: Diagnosis not present

## 2016-03-29 DIAGNOSIS — S3992XA Unspecified injury of lower back, initial encounter: Secondary | ICD-10-CM | POA: Diagnosis present

## 2016-03-29 DIAGNOSIS — M5136 Other intervertebral disc degeneration, lumbar region: Secondary | ICD-10-CM | POA: Diagnosis not present

## 2016-03-29 DIAGNOSIS — Y929 Unspecified place or not applicable: Secondary | ICD-10-CM | POA: Insufficient documentation

## 2016-03-29 DIAGNOSIS — Z87891 Personal history of nicotine dependence: Secondary | ICD-10-CM | POA: Insufficient documentation

## 2016-03-29 DIAGNOSIS — J449 Chronic obstructive pulmonary disease, unspecified: Secondary | ICD-10-CM | POA: Diagnosis not present

## 2016-03-29 DIAGNOSIS — X58XXXA Exposure to other specified factors, initial encounter: Secondary | ICD-10-CM | POA: Diagnosis not present

## 2016-03-29 MED ORDER — DICLOFENAC SODIUM 75 MG PO TBEC: 75.0000 mg | DELAYED_RELEASE_TABLET | Freq: Two times a day (BID) | ORAL | Status: DC

## 2016-03-29 MED ORDER — DEXAMETHASONE 4 MG PO TABS: 4.0000 mg | ORAL_TABLET | Freq: Two times a day (BID) | ORAL | Status: DC

## 2016-03-29 MED ORDER — CYCLOBENZAPRINE HCL 10 MG PO TABS: 10.0000 mg | ORAL_TABLET | Freq: Three times a day (TID) | ORAL | Status: DC

## 2016-03-29 NOTE — ED Notes (Signed)
Pt alert & oriented x4, stable gait. Patient given discharge instructions, paperwork & prescription(s). Patient  instructed to stop at the registration desk to finish any additional paperwork. Patient verbalized understanding. Pt left department w/ no further questions. 

## 2016-03-29 NOTE — Discharge Instructions (Signed)
Heating pad to your back may be helpful. Please rest your back as much as possible. Please see your primary MD or call Dr Alvan Dame for orthopedic evaluation of your back and worsening of your back pain. Use medications as suggested. Flexeril - This medication may cause drowsiness. Please do not drink, drive, or participate in activity that requires concentration while taking this medication. Please take decadron and diclofenac with food. Degenerative Disk Disease Degenerative disk disease is a condition caused by the changes that occur in spinal disks as you grow older. Spinal disks are soft and compressible disks located between the bones of your spine (vertebrae). These disks act like shock absorbers. Degenerative disk disease can affect the whole spine. However, the neck and lower back are most commonly affected. Many changes can occur in the spinal disks with aging, such as:  The spinal disks may dry and shrink.  Small tears may occur in the tough, outer covering of the disk (annulus).  The disk space may become smaller due to loss of water.  Abnormal growths in the bone (spurs) may occur. This can put pressure on the nerve roots exiting the spinal canal, causing pain.  The spinal canal may become narrowed. RISK FACTORS   Being overweight.  Having a family history of degenerative disk disease.  Smoking.  There is increased risk if you are doing heavy lifting or have a sudden injury. SIGNS AND SYMPTOMS  Symptoms vary from person to person and may include:  Pain that varies in intensity. Some people have no pain, while others have severe pain. The location of the pain depends on the part of your backbone that is affected.  You will have neck or arm pain if a disk in the neck area is affected.  You will have pain in your back, buttocks, or legs if a disk in the lower back is affected.  Pain that becomes worse while bending, reaching up, or with twisting movements.  Pain that may start  gradually and then get worse as time passes. It may also start after a major or minor injury.  Numbness or tingling in the arms or legs. DIAGNOSIS  Your health care provider will ask you about your symptoms and about activities or habits that may cause the pain. He or she may also ask about any injuries, diseases, or treatments you have had. Your health care provider will examine you to check for the range of movement that is possible in the affected area, to check for strength in your extremities, and to check for sensation in the areas of the arms and legs supplied by different nerve roots. You may also have:   An X-ray of the spine.  Other imaging tests, such as MRI. TREATMENT  Your health care provider will advise you on the best plan for treatment. Treatment may include:  Medicines.  Rehabilitation exercises. HOME CARE INSTRUCTIONS   Follow proper lifting and walking techniques as advised by your health care provider.  Maintain good posture.  Exercise regularly as advised by your health care provider.  Perform relaxation exercises.  Change your sitting, standing, and sleeping habits as advised by your health care provider.  Change positions frequently.  Lose weight or maintain a healthy weight as advised by your health care provider.  Do not use any tobacco products, including cigarettes, chewing tobacco, or electronic cigarettes. If you need help quitting, ask your health care provider.  Wear supportive footwear.  Take medicines only as directed by your health care  provider. SEEK MEDICAL CARE IF:   Your pain does not go away within 1-4 weeks.  You have significant appetite or weight loss. SEEK IMMEDIATE MEDICAL CARE IF:   Your pain is severe.  You notice weakness in your arms, hands, or legs.  You begin to lose control of your bladder or bowel movements.  You have fevers or night sweats. MAKE SURE YOU:   Understand these instructions.  Will watch your  condition.  Will get help right away if you are not doing well or get worse.   This information is not intended to replace advice given to you by your health care provider. Make sure you discuss any questions you have with your health care provider.   Document Released: 08/22/2007 Document Revised: 11/15/2014 Document Reviewed: 02/26/2014 Elsevier Interactive Patient Education [employer].

## 2016-03-29 NOTE — ED Notes (Signed)
Patient complaining of lower back pain x 4 days. Denies dysuria. States "it feels the same as it did when I hurt my back years ago." Denies recent injury.

## 2016-03-29 NOTE — ED Notes (Signed)
Pt states lower back pain that started about one week ago. Pt states pain worse on the left side w/ pain going to her left hip sometimes. Pt denies any new activity or injury.

## 2016-03-29 NOTE — ED Provider Notes (Signed)
CSN: [id number]     Arrival date & time 03/29/16  1450 History  By signing my name below, I, Joann White, attest that this documentation has been prepared under the direction and in the presence of Treatment Team:  Attending Provider: Fredia Sorrow, MD Physician Assistant: Joann Kocher, PA-C.  Electronically Signed: Verlee White, Medical Scribe. 03/29/2016. 4:20 PM.   Chief Complaint  Patient presents with  . Back Pain   The history is provided by the patient. No language interpreter was used.   HPI Comments: Joann White is a 40 y.o. female with PMHx of bulging herniated disk in 2012 who presents to the Emergency Department complaining of lower back pain without injury onset 4 days. Pt reports she was moving around a little more since onset but nothing too out of the ordinary. Pt reports the pain is located in her mid lower back that and radiates to her left buttock. Pt states that she is having associated symptoms of trouble with ambulating, bending, and sitting up. She states that she has tried laying down with no relief for her symptoms. She denies bowel incompetence and any other symptoms. Pt mentions the last time she saw an orthopedic was in 2012 or 2013. Pt reports Joann White is her PCP.   Past Medical History  Diagnosis Date  . COPD (chronic obstructive pulmonary disease) ([place])   . Chickenpox   . Headache   . Allergic rhinitis    Past Surgical History  Procedure Laterality Date  . None     Family History  Problem Relation Age of Onset  . Alcoholism Father   . Lung cancer Father   . Arthritis      Parent, grandparent  . Breast cancer Paternal Aunt   . Lung cancer Paternal Aunt   . Heart disease      Parent, grandparent  . Stroke Paternal Barbaraann Rondo     First cousin as well  . Hypertension      Parent, grandparent  . Diabetes      Maternal grandmother and grandfather   . Healthy Mother   . Sickle cell anemia      Half-brother  . Healthy Son    Social History   Substance Use Topics  . Smoking status: Former Smoker -- 0.50 packs/day for 18 years    Types: Cigarettes    Quit date: 12/31/2015  . Smokeless tobacco: Never Used  . Alcohol Use: 0.0 oz/week    0 Standard drinks or equivalent per week     Comment: rarely   OB History    No data available     Review of Systems  Genitourinary: Negative for difficulty urinating.  Musculoskeletal: Positive for back pain.  All other systems reviewed and are negative.  Allergies  Hydromorphone  Home Medications   Prior to Admission medications   Medication Sig Start Date End Date Taking? Authorizing Provider  albuterol (PROVENTIL HFA;VENTOLIN HFA) 108 (90 BASE) MCG/ACT inhaler Inhale 2 puffs into the lungs every 6 (six) hours as needed. Reported on 11/14/2015    Historical Provider, MD  amLODipine (NORVASC) 5 MG tablet TAKE ONE TABLET BY MOUTH ONCE DAILY 02/10/16   Joann Haven, MD  aspirin 81 MG tablet Take 81 mg by mouth daily.    Historical Provider, MD  cephALEXin (KEFLEX) 500 MG capsule Take 1 capsule (500 mg total) by mouth 2 (two) times daily. 12/09/15   Joann Spikes, DO  Cyanocobalamin (VITAMIN B-12 PO) Take 1 tablet by mouth daily. Reported  on 11/14/2015    Historical Provider, MD  ferrous sulfate 325 (65 FE) MG tablet Take 1 tablet (325 mg total) by mouth daily with breakfast. 11/21/15   Joann Haven, MD  loratadine (CLARITIN) 10 MG tablet Take 1 tablet (10 mg total) by mouth daily. 11/21/15   Joann Haven, MD  mometasone (NASONEX) 50 MCG/ACT nasal spray Place 2 sprays into the nose daily. 11/21/15   Joann Haven, MD  naproxen sodium (ALEVE) 220 MG tablet Take 220 mg by mouth 2 (two) times daily as needed. Reported on 11/14/2015    Historical Provider, MD  nortriptyline (PAMELOR) 10 MG capsule Take 1 capsule (10 mg total) by mouth at bedtime. 12/08/15   Joann Keith Rake, DO  omeprazole (PRILOSEC) 20 MG capsule TAKE ONE CAPSULE BY MOUTH ONCE DAILY 03/12/16   Joann Haven, MD   promethazine (PHENERGAN) 12.5 MG tablet Take 1 tablet (12.5 mg total) by mouth every 8 (eight) hours as needed for nausea or vomiting. 11/14/15   Joann Haven, MD  traMADol Veatrice Bourbon) 50 MG tablet  12/04/15   Historical Provider, MD   BP 139/94 mmHg  Pulse 92  Temp(Src) 98.7 F (37.1 C) (Oral)  Resp 16  Ht 5\' 6"  (1.676 m)  Wt 190 lb (86.183 kg)  BMI 30.68 kg/m2  SpO2 100%  LMP 02/16/2016 Physical Exam  Constitutional: She is oriented to person, place, and time. She appears well-developed and well-nourished.  Non-toxic appearance.  HENT:  Head: Normocephalic.  Right Ear: Tympanic membrane and external ear normal.  Left Ear: Tympanic membrane and external ear normal.  Eyes: EOM and lids are normal. Pupils are equal, round, and reactive to light.  Neck: Normal range of motion. Neck supple. Carotid bruit is not present.  Cardiovascular: Normal rate, regular rhythm, normal heart sounds, intact distal pulses and normal pulses.   Pulmonary/Chest: Breath sounds normal. No respiratory distress.  Abdominal: Soft. Bowel sounds are normal. There is no tenderness. There is no guarding.  Musculoskeletal: Normal range of motion.  Para-spinal tenderness on lumbar on left and radiates to the buttock on the left to the lower back on the right FROM of the upper extremities  Lymphadenopathy:       Head (right side): No submandibular adenopathy present.       Head (left side): No submandibular adenopathy present.    She has no cervical adenopathy.  Neurological: She is alert and oriented to person, place, and time. She has normal strength. She displays normal reflexes. No cranial nerve deficit or sensory deficit. She exhibits normal muscle tone. Coordination normal.  No gross neurological deficits noted Grip symmetrical No motor or sensory deficits of the lower extremity  Skin: Skin is warm and dry.  Psychiatric: She has a normal mood and affect. Her speech is normal.  Nursing note and vitals  reviewed.   ED Course  Procedures  DIAGNOSTIC STUDIES: Oxygen Saturation is 100% on RA, NL by my interpretation.    COORDINATION OF CARE: 4:34 PM Discussed treatment plan with pt at bedside and pt agreed to plan.   MDM  Vital signs stable. No gross neuro deficit. No evidence for caudal equina or other emergent changes. Review of imaging reveals DDD in the past. Rx for flexeril, decadron, and voltaren given to the patient. Pt referred to orthopedics.   Final diagnoses:  Lumbar strain, initial encounter  DDD (degenerative disc disease), lumbar    *I have reviewed nursing notes, vital signs, and all appropriate lab and  imaging results for this patient.**  **I personally performed the services described in this documentation, which was scribed in my presence. The recorded information has been reviewed and is accurate.Joann Kocher, PA-C 03/31/16 [place], MD 04/02/16 [phone]

## 2016-08-11 ENCOUNTER — Encounter: Payer: Self-pay | Admitting: *Deleted

## 2016-08-12 ENCOUNTER — Ambulatory Visit: Payer: BLUE CROSS/BLUE SHIELD | Admitting: Cardiovascular Disease

## 2016-08-12 ENCOUNTER — Encounter: Payer: Self-pay | Admitting: Cardiovascular Disease

## 2016-10-06 ENCOUNTER — Emergency Department (HOSPITAL_COMMUNITY)
Admission: EM | Admit: 2016-10-06 | Discharge: 2016-10-06 | Disposition: A | Discharge status: Home / Self Care | Payer: BLUE CROSS/BLUE SHIELD | Attending: Emergency Medicine | Admitting: Emergency Medicine

## 2016-10-06 ENCOUNTER — Encounter (HOSPITAL_COMMUNITY): Payer: Self-pay | Admitting: *Deleted

## 2016-10-06 DIAGNOSIS — R42 Dizziness and giddiness: Secondary | ICD-10-CM | POA: Insufficient documentation

## 2016-10-06 DIAGNOSIS — J449 Chronic obstructive pulmonary disease, unspecified: Secondary | ICD-10-CM | POA: Insufficient documentation

## 2016-10-06 DIAGNOSIS — Z7982 Long term (current) use of aspirin: Secondary | ICD-10-CM | POA: Insufficient documentation

## 2016-10-06 DIAGNOSIS — Z79899 Other long term (current) drug therapy: Secondary | ICD-10-CM | POA: Insufficient documentation

## 2016-10-06 DIAGNOSIS — F17210 Nicotine dependence, cigarettes, uncomplicated: Secondary | ICD-10-CM | POA: Insufficient documentation

## 2016-10-06 DIAGNOSIS — I10 Essential (primary) hypertension: Secondary | ICD-10-CM | POA: Insufficient documentation

## 2016-10-06 HISTORY — DX: Headache: R51

## 2016-10-06 HISTORY — DX: Dizziness and giddiness: R42

## 2016-10-06 HISTORY — DX: Headache, unspecified: R51.9

## 2016-10-06 MED ORDER — MECLIZINE HCL 25 MG PO TABS: 25.0000 mg | ORAL_TABLET | Freq: Three times a day (TID) | ORAL | 0 refills | Status: DC | PRN

## 2016-10-06 MED ORDER — MECLIZINE HCL 12.5 MG PO TABS
25.0000 mg | ORAL_TABLET | Freq: Once | ORAL | Status: AC
  Administered 2016-10-06: 25 mg via ORAL
  Filled 2016-10-06: qty 2

## 2016-10-06 NOTE — ED Triage Notes (Signed)
Pt c/o headache and dizziness x 4-5 days with slight photo sensitivity. Pt reports no hx of migraine headaches. Pt reports she has been taking Aleve for the pain which helps for 10-15 minutes but then comes right back. Reports nausea, no vomiting.

## 2016-10-06 NOTE — ED Provider Notes (Signed)
[facility] DEPT Provider Note   CSN: [id number] Arrival date & time: 10/06/16  1324     History   Chief Complaint Chief Complaint  Patient presents with  . Headache  . Dizziness    HPI Joann White is a 40 y.o. female.  HPI   40 year old female with vertigo. She reports intermittent episodes of feeling very off balance. Sometimes she has to hold onto nearby objects to steady herself. Worse with movement. Some pressure in her right ear. No tenderness. No acute numbness, tingling or focal loss of strength.  Past Medical History:  Diagnosis Date  . Allergic rhinitis   . Chickenpox   . Chronic daily headache    with vision changes and muffled hearing  . COPD (chronic obstructive pulmonary disease) ([place])   . Dizziness     Patient Active Problem List   Diagnosis Date Noted  . Dysuria 12/09/2015  . Missed period 11/23/2015  . Tobacco use 11/21/2015  . Essential hypertension 11/16/2015  . Chest pain 11/16/2015  . Headache 11/16/2015  . Swallowing difficulty 11/16/2015    Past Surgical History:  Procedure Laterality Date  . None      OB History    No data available       Home Medications    Prior to Admission medications   Medication Sig Start Date End Date Taking? Authorizing Provider  albuterol (PROVENTIL HFA;VENTOLIN HFA) 108 (90 BASE) MCG/ACT inhaler Inhale 2 puffs into the lungs every 6 (six) hours as needed. Reported on 11/14/2015    Historical Provider, MD  amLODipine (NORVASC) 5 MG tablet TAKE ONE TABLET BY MOUTH ONCE DAILY 02/10/16   Leone Haven, MD  aspirin 81 MG tablet Take 81 mg by mouth daily.    Historical Provider, MD  cephALEXin (KEFLEX) 500 MG capsule Take 1 capsule (500 mg total) by mouth 2 (two) times daily. 12/09/15   Coral Spikes, DO  Cyanocobalamin (VITAMIN B-12 PO) Take 1 tablet by mouth daily. Reported on 11/14/2015    Historical Provider, MD  cyclobenzaprine (FLEXERIL) 10 MG tablet Take 1 tablet (10 mg total) by mouth 3 (three)  times daily. 03/29/16   Lily Kocher, PA-C  dexamethasone (DECADRON) 4 MG tablet Take 1 tablet (4 mg total) by mouth 2 (two) times daily with a meal. 03/29/16   Lily Kocher, PA-C  diclofenac (VOLTAREN) 75 MG EC tablet Take 1 tablet (75 mg total) by mouth 2 (two) times daily. 03/29/16   Lily Kocher, PA-C  ferrous sulfate 325 (65 FE) MG tablet Take 1 tablet (325 mg total) by mouth daily with breakfast. 11/21/15   Leone Haven, MD  loratadine (CLARITIN) 10 MG tablet Take 1 tablet (10 mg total) by mouth daily. 11/21/15   Leone Haven, MD  meclizine (ANTIVERT) 25 MG tablet Take 1 tablet (25 mg total) by mouth 3 (three) times daily as needed for dizziness. 10/06/16   Virgel Manifold, MD  mometasone (NASONEX) 50 MCG/ACT nasal spray Place 2 sprays into the nose daily. 11/21/15   Leone Haven, MD  naproxen sodium (ALEVE) 220 MG tablet Take 220 mg by mouth 2 (two) times daily as needed. Reported on 11/14/2015    Historical Provider, MD  nortriptyline (PAMELOR) 10 MG capsule Take 1 capsule (10 mg total) by mouth at bedtime. 12/08/15   Donika Keith Rake, DO  omeprazole (PRILOSEC) 20 MG capsule TAKE ONE CAPSULE BY MOUTH ONCE DAILY 03/12/16   Leone Haven, MD  promethazine (PHENERGAN) 12.5 MG tablet Take  1 tablet (12.5 mg total) by mouth every 8 (eight) hours as needed for nausea or vomiting. 11/14/15   Leone Haven, MD  traMADol Veatrice Bourbon) 50 MG tablet  12/04/15   Historical Provider, MD    Family History Family History  Problem Relation Age of Onset  . Alcoholism Father   . Lung cancer Father   . Healthy Mother   . Arthritis      Parent, grandparent  . Heart disease      Parent, grandparent  . Hypertension      Parent, grandparent  . Diabetes      Maternal grandmother and grandfather   . Breast cancer Paternal Aunt   . Lung cancer Paternal Aunt   . Stroke Paternal Barbaraann Rondo     First cousin as well  . Sickle cell anemia      Half-brother  . Healthy Son     Social History Social History   Substance Use Topics  . Smoking status: Current Every Day Smoker    Packs/day: 0.75    Years: 18.00    Types: Cigarettes    Last attempt to quit: 12/31/2015  . Smokeless tobacco: Never Used  . Alcohol use 0.0 oz/week     Comment: rarely     Allergies   Hydromorphone   Review of Systems Review of Systems  All systems reviewed and negative, other than as noted in HPI.   Physical Exam Updated Vital Signs BP 144/91   Pulse 75   Temp 97.8 F (36.6 C) (Oral)   Resp 16   Ht 5\' 6"  (1.676 m)   Wt 185 lb (83.9 kg)   LMP 09/27/2016   SpO2 100%   BMI 29.86 kg/m   Physical Exam  Constitutional: She appears well-developed and well-nourished. No distress.  HENT:  Head: Normocephalic and atraumatic.  Eyes: Conjunctivae are normal. Right eye exhibits no discharge. Left eye exhibits no discharge.  Neck: Neck supple.  Cardiovascular: Normal rate, regular rhythm and normal heart sounds.  Exam reveals no gallop and no friction rub.   No murmur heard. Pulmonary/Chest: Effort normal and breath sounds normal. No respiratory distress.  Abdominal: Soft. She exhibits no distension. There is no tenderness.  Musculoskeletal: She exhibits no edema or tenderness.  Neurological: She is alert.  Skin: Skin is warm and dry.  Psychiatric: She has a normal mood and affect. Her behavior is normal. Thought content normal.  Nursing note and vitals reviewed.    ED Treatments / Results  Labs (all labs ordered are listed, but only abnormal results are displayed) Labs Reviewed - No data to display  EKG  EKG Interpretation None       Radiology No results found.  Procedures Procedures (including critical care time)  Medications Ordered in ED Medications  meclizine (ANTIVERT) tablet 25 mg (not administered)     Initial Impression / Assessment and Plan / ED Course  I have reviewed the triage vital signs and the nursing notes.  Pertinent labs & imaging results that were available  during my care of the patient were reviewed by me and considered in my medical decision making (see chart for details).  Clinical Course    40yF with vertigo. Symptoms consistent with peripheral etiology. Positional. Reproducible with dix-hallpike. Fatigueable. nonfocal neuro exam. Plan PRn meclizine. Instructions for Epley maneuver.   It has been determined that no acute conditions requiring further emergency intervention are present at this time. The patient has been advised of the diagnosis and plan. I reviewed  any labs and imaging including any potential incidental findings. We have discussed signs and symptoms that warrant return to the ED and they are listed in the discharge instructions.    Final Clinical Impressions(s) / ED Diagnoses   Final diagnoses:  Vertigo    New Prescriptions New Prescriptions   MECLIZINE (ANTIVERT) 25 MG TABLET    Take 1 tablet (25 mg total) by mouth 3 (three) times daily as needed for dizziness.     Virgel Manifold, MD 10/14/16 [phone]

## 2017-03-25 ENCOUNTER — Emergency Department (HOSPITAL_COMMUNITY)
Admission: EM | Admit: 2017-03-25 | Discharge: 2017-03-25 | Disposition: A | Discharge status: Home / Self Care | Payer: Self-pay | Attending: Emergency Medicine | Admitting: Emergency Medicine

## 2017-03-25 ENCOUNTER — Emergency Department (HOSPITAL_COMMUNITY): Payer: Self-pay

## 2017-03-25 ENCOUNTER — Encounter (HOSPITAL_COMMUNITY): Payer: Self-pay | Admitting: Emergency Medicine

## 2017-03-25 DIAGNOSIS — N939 Abnormal uterine and vaginal bleeding, unspecified: Secondary | ICD-10-CM

## 2017-03-25 DIAGNOSIS — Z3A01 Less than 8 weeks gestation of pregnancy: Secondary | ICD-10-CM | POA: Insufficient documentation

## 2017-03-25 DIAGNOSIS — R1030 Lower abdominal pain, unspecified: Secondary | ICD-10-CM | POA: Insufficient documentation

## 2017-03-25 DIAGNOSIS — O200 Threatened abortion: Secondary | ICD-10-CM | POA: Insufficient documentation

## 2017-03-25 DIAGNOSIS — R109 Unspecified abdominal pain: Secondary | ICD-10-CM

## 2017-03-25 DIAGNOSIS — F17210 Nicotine dependence, cigarettes, uncomplicated: Secondary | ICD-10-CM | POA: Insufficient documentation

## 2017-03-25 DIAGNOSIS — J449 Chronic obstructive pulmonary disease, unspecified: Secondary | ICD-10-CM | POA: Insufficient documentation

## 2017-03-25 DIAGNOSIS — O99331 Smoking (tobacco) complicating pregnancy, first trimester: Secondary | ICD-10-CM | POA: Insufficient documentation

## 2017-03-25 DIAGNOSIS — O10011 Pre-existing essential hypertension complicating pregnancy, first trimester: Secondary | ICD-10-CM | POA: Insufficient documentation

## 2017-03-25 DIAGNOSIS — O26891 Other specified pregnancy related conditions, first trimester: Secondary | ICD-10-CM

## 2017-03-25 LAB — CBC WITH DIFFERENTIAL/PLATELET
BASOS ABS: 0 10*3/uL (ref 0.0–0.1)
Basophils Relative: 0 %
EOS PCT: 2 %
Eosinophils Absolute: 0.1 10*3/uL (ref 0.0–0.7)
HEMATOCRIT: 33 % — AB (ref 36.0–46.0)
Hemoglobin: 11 g/dL — ABNORMAL LOW (ref 12.0–15.0)
LYMPHS ABS: 2.5 10*3/uL (ref 0.7–4.0)
LYMPHS PCT: 30 %
MCH: 24.2 pg — AB (ref 26.0–34.0)
MCHC: 33.3 g/dL (ref 30.0–36.0)
MCV: 72.7 fL — AB (ref 78.0–100.0)
MONO ABS: 0.6 10*3/uL (ref 0.1–1.0)
Monocytes Relative: 7 %
NEUTROS ABS: 5.1 10*3/uL (ref 1.7–7.7)
Neutrophils Relative %: 61 %
PLATELETS: 558 10*3/uL — AB (ref 150–400)
RBC: 4.54 MIL/uL (ref 3.87–5.11)
RDW: 17 % — AB (ref 11.5–15.5)
WBC: 8.4 10*3/uL (ref 4.0–10.5)

## 2017-03-25 LAB — URINALYSIS, MICROSCOPIC (REFLEX)

## 2017-03-25 LAB — URINALYSIS, ROUTINE W REFLEX MICROSCOPIC
Bilirubin Urine: NEGATIVE
GLUCOSE, UA: NEGATIVE mg/dL
KETONES UR: NEGATIVE mg/dL
NITRITE: NEGATIVE
Protein, ur: 30 mg/dL — AB
Specific Gravity, Urine: 1.025 (ref 1.005–1.030)
pH: 6 (ref 5.0–8.0)

## 2017-03-25 LAB — ABO/RH: ABO/RH(D): A POS

## 2017-03-25 LAB — PREGNANCY, URINE: PREG TEST UR: POSITIVE — AB

## 2017-03-25 LAB — HCG, QUANTITATIVE, PREGNANCY: hCG, Beta Chain, Quant, S: 216 m[IU]/mL — ABNORMAL HIGH (ref <5)

## 2017-03-25 MED ORDER — CEPHALEXIN 500 MG PO CAPS: 500.0000 mg | ORAL_CAPSULE | Freq: Two times a day (BID) | ORAL | 0 refills | Status: DC

## 2017-03-25 MED ORDER — ACETAMINOPHEN 325 MG PO TABS
650.0000 mg | ORAL_TABLET | Freq: Once | ORAL | Status: AC
  Administered 2017-03-25: 650 mg via ORAL
  Filled 2017-03-25: qty 2

## 2017-03-25 NOTE — ED Provider Notes (Signed)
[facility] DEPT Provider Note   CSN: [id number] Arrival date & time: 03/25/17  0700     History   Chief Complaint Chief Complaint  Patient presents with  . Abdominal Cramping    + home pregnancy test on Sunday    HPI Joann White is a 41 y.o. female.  Patient with history of vaginal delivery, abortion, miscarriage presents with vaginal spotting and cramping since Wednesday. Intermittent. Miscarriage was January last year. No nausea vomiting. No fevers or chills. Patient is a home pregnancy test was positive. Patient had the same sexual partner for 6 years. Patient has never had an ectopic pregnancy.      Past Medical History:  Diagnosis Date  . Allergic rhinitis   . Chickenpox   . Chronic daily headache    with vision changes and muffled hearing  . COPD (chronic obstructive pulmonary disease) ([place])   . Dizziness     Patient Active Problem List   Diagnosis Date Noted  . Dysuria 12/09/2015  . Missed period 11/23/2015  . Tobacco use 11/21/2015  . Essential hypertension 11/16/2015  . Chest pain 11/16/2015  . Headache 11/16/2015  . Swallowing difficulty 11/16/2015    Past Surgical History:  Procedure Laterality Date  . None      OB History    Gravida Para Term Preterm AB Living   1             SAB TAB Ectopic Multiple Live Births                   Home Medications    Prior to Admission medications   Medication Sig Start Date End Date Taking? Authorizing Provider  amLODipine (NORVASC) 5 MG tablet TAKE ONE TABLET BY MOUTH ONCE DAILY 02/10/16  Yes Leone Haven, MD  albuterol (PROVENTIL HFA;VENTOLIN HFA) 108 (90 BASE) MCG/ACT inhaler Inhale 2 puffs into the lungs every 6 (six) hours as needed. Reported on 11/14/2015    [provider]  aspirin 81 MG tablet Take 81 mg by mouth daily.    [provider]  cephALEXin (KEFLEX) 500 MG capsule Take 1 capsule (500 mg total) by mouth 2 (two) times daily. 03/25/17   Elnora Morrison, MD    Cyanocobalamin (VITAMIN B-12 PO) Take 1 tablet by mouth daily. Reported on 11/14/2015    [provider]  cyclobenzaprine (FLEXERIL) 10 MG tablet Take 1 tablet (10 mg total) by mouth 3 (three) times daily. 03/29/16   Lily Kocher, PA-C  dexamethasone (DECADRON) 4 MG tablet Take 1 tablet (4 mg total) by mouth 2 (two) times daily with a meal. 03/29/16   Lily Kocher, PA-C  diclofenac (VOLTAREN) 75 MG EC tablet Take 1 tablet (75 mg total) by mouth 2 (two) times daily. 03/29/16   Lily Kocher, PA-C  ferrous sulfate 325 (65 FE) MG tablet Take 1 tablet (325 mg total) by mouth daily with breakfast. 11/21/15   Leone Haven, MD  loratadine (CLARITIN) 10 MG tablet Take 1 tablet (10 mg total) by mouth daily. 11/21/15   Leone Haven, MD  meclizine (ANTIVERT) 25 MG tablet Take 1 tablet (25 mg total) by mouth 3 (three) times daily as needed for dizziness. 10/06/16   Virgel Manifold, MD  mometasone (NASONEX) 50 MCG/ACT nasal spray Place 2 sprays into the nose daily. 11/21/15   Leone Haven, MD  naproxen sodium (ALEVE) 220 MG tablet Take 220 mg by mouth 2 (two) times daily as needed. Reported on 11/14/2015  [provider]  nortriptyline (PAMELOR) 10 MG capsule Take 1 capsule (10 mg total) by mouth at bedtime. 12/08/15   Patel, Arvin Collard K, DO  omeprazole (PRILOSEC) 20 MG capsule TAKE ONE CAPSULE BY MOUTH ONCE DAILY 03/12/16   Leone Haven, MD  promethazine (PHENERGAN) 12.5 MG tablet Take 1 tablet (12.5 mg total) by mouth every 8 (eight) hours as needed for nausea or vomiting. 11/14/15   Leone Haven, MD  traMADol Veatrice Bourbon) 50 MG tablet  12/04/15   [provider]    Family History Family History  Problem Relation Age of Onset  . Alcoholism Father   . Lung cancer Father   . Healthy Mother   . Arthritis Unknown        Parent, grandparent  . Heart disease Unknown        Parent, grandparent  . Hypertension Unknown        Parent, grandparent  . Diabetes Unknown         Maternal grandmother and grandfather   . Breast cancer Paternal Aunt   . Lung cancer Paternal Aunt   . Stroke Paternal Barbaraann Rondo        First cousin as well  . Sickle cell anemia Unknown        Half-brother  . Healthy Son     Social History Social History  Substance Use Topics  . Smoking status: Current Some Day Smoker    Packs/day: 0.50    Years: 18.00    Types: Cigarettes    Last attempt to quit: 12/31/2015  . Smokeless tobacco: Never Used  . Alcohol use 0.0 oz/week     Comment: rarely     Allergies   Hydromorphone   Review of Systems Review of Systems  Constitutional: Negative for chills and fever.  HENT: Negative for congestion.   Eyes: Negative for visual disturbance.  Respiratory: Negative for shortness of breath.   Cardiovascular: Negative for chest pain.  Gastrointestinal: Positive for abdominal pain. Negative for vomiting.  Genitourinary: Positive for vaginal bleeding. Negative for dysuria and flank pain.  Musculoskeletal: Negative for back pain, neck pain and neck stiffness.  Skin: Negative for rash.  Neurological: Negative for light-headedness and headaches.     Physical Exam Updated Vital Signs BP (!) 141/108 (BP Location: Left Arm)   Pulse (!) 107   Temp 98.2 F (36.8 C) (Oral)   Resp 18   Ht 5\' 6"  (1.676 m)   Wt 180 lb (81.6 kg)   LMP 02/16/2017 (Exact Date)   SpO2 98%   BMI 29.05 kg/m   Physical Exam  Constitutional: She is oriented to person, place, and time. She appears well-developed and well-nourished.  HENT:  Head: Normocephalic and atraumatic.  Eyes: Conjunctivae are normal. Right eye exhibits no discharge. Left eye exhibits no discharge.  Neck: Neck supple. No tracheal deviation present.  Cardiovascular: Normal rate and regular rhythm.   Pulmonary/Chest: Effort normal and breath sounds normal.  Abdominal: Soft. She exhibits no distension. There is tenderness (mild suprapubic). There is no guarding.  Genitourinary:   Genitourinary Comments: os closed, mild bleeding.  Musculoskeletal: She exhibits no edema.  Neurological: She is alert and oriented to person, place, and time.  Skin: Skin is warm. No rash noted.  Psychiatric: She has a normal mood and affect.  Nursing note and vitals reviewed.    ED Treatments / Results  Labs (all labs ordered are listed, but only abnormal results are displayed) Labs Reviewed  PREGNANCY, URINE - Abnormal; Notable  for the following:       Result Value   Preg Test, Ur POSITIVE (*)    All other components within normal limits  URINALYSIS, ROUTINE W REFLEX MICROSCOPIC - Abnormal; Notable for the following:    Color, Urine AMBER (*)    APPearance HAZY (*)    Hgb urine dipstick LARGE (*)    Protein, ur 30 (*)    Leukocytes, UA SMALL (*)    All other components within normal limits  HCG, QUANTITATIVE, PREGNANCY - Abnormal; Notable for the following:    hCG, Beta Chain, Quant, S 216 (*)    All other components within normal limits  CBC WITH DIFFERENTIAL/PLATELET - Abnormal; Notable for the following:    Hemoglobin 11.0 (*)    HCT 33.0 (*)    MCV 72.7 (*)    MCH 24.2 (*)    RDW 17.0 (*)    Platelets 558 (*)    All other components within normal limits  URINALYSIS, MICROSCOPIC (REFLEX) - Abnormal; Notable for the following:    Bacteria, UA MANY (*)    Squamous Epithelial / LPF TOO NUMEROUS TO COUNT (*)    All other components within normal limits  URINE CULTURE  ABO/RH    EKG  EKG Interpretation None       Radiology US Ob Comp < 14 Wks  Result Date: 03/25/2017 CLINICAL DATA:  Pelvic cramping and vaginal spotting for 2 days. Positive pregnancy test EXAM: OBSTETRIC <14 WK [name] AND TRANSVAGINAL OB US TECHNIQUE: Both transabdominal and transvaginal ultrasound examinations were performed for complete evaluation of the gestation as well as the maternal uterus, adnexal regions, and pelvic cul-de-sac. Transvaginal technique was performed to assess early pregnancy.  COMPARISON:  None. FINDINGS: Intrauterine gestational sac: Not visualized Yolk sac:  Not visualized Embryo:  Not visualized Cardiac Activity: Not visualized Subchorionic hemorrhage:  None visualized. Maternal uterus/adnexae: Uterus measures 12.5 x 8.2 x 9.4 cm The uterus has a diffusely inhomogeneous appearance consistent with leiomyomatous change. There is a well-defined leiomyoma in the rightward superior fundal region measuring 2.4 x 1.5 x 2.0 cm. A second well-defined leiomyoma this area measures 1.6 x 1.1 x 1.2 cm. The endometrium measures just over 1 cm in thickness. Cervical os is closed. There is a mildly irregular cystic structure within the left ovary measuring 4.4 x 2.0 x 2.3 cm. Other adnexal structures appear unremarkable. There is no free pelvic fluid. IMPRESSION: No intrauterine gestation is seen. 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 In the left ovary, there is a slightly irregular cystic structure measuring 4.4 x 2.0 x 2.3 cm. There is no wall thickening in this structure. There is no extra uterine gestation seen currently. No free pelvic fluid. Differential considerations given positive beta HCG examination and these finding include intrauterine gestation too early to be seen by either transabdominal or transvaginal technique; recent spontaneous abortion ; possible ectopic gestation. Ectopic gestation does remain as a differential consideration. This circumstance warrants close clinical and imaging surveillance. Timing of repeat ultrasound will in large part depend on beta HCG values going forward. The cystic mass in the left adnexal region will warrant continued imaging surveillance. Electronically Signed   By: Lowella Grip III M.D.   On: 03/25/2017 08:52   US Ob Transvaginal  Result Date: 03/25/2017 CLINICAL DATA:  Pelvic cramping and vaginal spotting for 2 days. Positive pregnancy test EXAM: OBSTETRIC <14 WK [name] AND TRANSVAGINAL OB US TECHNIQUE: Both transabdominal and transvaginal  ultrasound examinations were performed for complete evaluation of  the gestation as well as the maternal uterus, adnexal regions, and pelvic cul-de-sac. Transvaginal technique was performed to assess early pregnancy. COMPARISON:  None. FINDINGS: Intrauterine gestational sac: Not visualized Yolk sac:  Not visualized Embryo:  Not visualized Cardiac Activity: Not visualized Subchorionic hemorrhage:  None visualized. Maternal uterus/adnexae: Uterus measures 12.5 x 8.2 x 9.4 cm The uterus has a diffusely inhomogeneous appearance consistent with leiomyomatous change. There is a well-defined leiomyoma in the rightward superior fundal region measuring 2.4 x 1.5 x 2.0 cm. A second well-defined leiomyoma this area measures 1.6 x 1.1 x 1.2 cm. The endometrium measures just over 1 cm in thickness. Cervical os is closed. There is a mildly irregular cystic structure within the left ovary measuring 4.4 x 2.0 x 2.3 cm. Other adnexal structures appear unremarkable. There is no free pelvic fluid. IMPRESSION: No intrauterine gestation is seen. 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 In the left ovary, there is a slightly irregular cystic structure measuring 4.4 x 2.0 x 2.3 cm. There is no wall thickening in this structure. There is no extra uterine gestation seen currently. No free pelvic fluid. Differential considerations given positive beta HCG examination and these finding include intrauterine gestation too early to be seen by either transabdominal or transvaginal technique; recent spontaneous abortion ; possible ectopic gestation. Ectopic gestation does remain as a differential consideration. This circumstance warrants close clinical and imaging surveillance. Timing of repeat ultrasound will in large part depend on beta HCG values going forward. The cystic mass in the left adnexal region will warrant continued imaging surveillance. Electronically Signed   By: Lowella Grip III M.D.   On: 03/25/2017 08:52     Procedures Procedures (including critical care time) EMERGENCY DEPARTMENT [name] PREGNANCY "Study: Limited Ultrasound of the Pelvis for Pregnancy"  INDICATIONS:Pregnancy(required) Multiple views of the uterus and pelvic cavity were obtained in real-time with a multi-frequency probe.  APPROACH:Transabdominal  PERFORMED BY: Myself IMAGES ARCHIVED?: Yes LIMITATIONS: Body habitus PREGNANCY FREE FLUID: None  INTERPRETATION: No IUP seen    Medications Ordered in ED Medications  acetaminophen (TYLENOL) tablet 650 mg (650 mg Oral Given 03/25/17 0735)     Initial Impression / Assessment and Plan / ED Course  I have reviewed the triage vital signs and the nursing notes.  Pertinent labs & imaging results that were available during my care of the patient were reviewed by me and considered in my medical decision making (see chart for details).     Patient presents with new pregnancy and vaginal spotting. Plan to check blood type, CBC, formal ultrasound as bedside ultrasound did not show definitive intrauterine pregnancy. Discussed likely outpatient follow-up with OB on Monday.  Patient has no definitive intrauterine pregnancy. Differential listed in ultrasound report which was reviewed. Patient also has a cystic mass. Long discussion with importance of follow-up with OB/GYN on Monday. Strict reasons to return given.  Results and differential diagnosis were discussed with the patient/parent/guardian. Xrays were independently reviewed by myself.  Close follow up outpatient was discussed, comfortable with the plan.   Medications  acetaminophen (TYLENOL) tablet 650 mg (650 mg Oral Given 03/25/17 0735)    Vitals:   03/25/17 0705 03/25/17 0707  BP:  (!) 141/108  Pulse:  (!) 107  Resp:  18  Temp:  98.2 F (36.8 C)  TempSrc:  Oral  SpO2:  98%  Weight: 180 lb (81.6 kg)   Height: 5\' 6"  (1.676 m)     Final diagnoses:  Vaginal spotting  Abdominal pain during pregnancy, first  trimester  Threatened miscarriage     Final Clinical Impressions(s) / ED Diagnoses   Final diagnoses:  Vaginal spotting  Abdominal pain during pregnancy, first trimester  Threatened miscarriage  Cystic mass ovary  New Prescriptions New Prescriptions   CEPHALEXIN (KEFLEX) 500 MG CAPSULE    Take 1 capsule (500 mg total) by mouth 2 (two) times daily.     Elnora Morrison, MD 03/25/17 [phone]

## 2017-03-25 NOTE — ED Triage Notes (Signed)
Home pregnancy test positive on Sunday.  Stated with cramping on Wednesday with amount of spotting.  Rates Pain 8/10 to lower abdomen.  Denies any n/v.  Miscarriage Jan of last year.

## 2017-03-25 NOTE — Discharge Instructions (Signed)
Follow up with OB on Monday, very important.  If you have worsening pain, pass out or new concerns return to the ER. Follow up urine culture.  If you were given medicines take as directed.  If you are on coumadin or contraceptives realize their levels and effectiveness is altered by many different medicines.  If you have any reaction (rash, tongues swelling, other) to the medicines stop taking and see a physician.    If your blood pressure was elevated in the ER make sure you follow up for management with a primary doctor or return for chest pain, shortness of breath or stroke symptoms.  Please follow up as directed and return to the ER or see a physician for new or worsening symptoms.  Thank you. Vitals:   03/25/17 0705 03/25/17 0707  BP:  (!) 141/108  Pulse:  (!) 107  Resp:  18  Temp:  98.2 F (36.8 C)  TempSrc:  Oral  SpO2:  98%  Weight: 180 lb (81.6 kg)   Height: 5\' 6"  (1.676 m)

## 2017-03-26 LAB — URINE CULTURE: Culture: >=100000 — AB

## 2017-03-27 ENCOUNTER — Telehealth: Payer: Self-pay

## 2017-03-27 NOTE — Telephone Encounter (Signed)
Post ED Visit - Positive Culture Follow-up  Culture report reviewed by antimicrobial stewardship pharmacist:  []  Elenor Quinones, Pharm.D. []  Heide Guile, Pharm.D., BCPS AQ-ID []  Parks Neptune, Pharm.D., BCPS []  Alycia Rossetti, Pharm.D., BCPS []  [place], Pharm.D., BCPS, AAHIVP [x]  Legrand Como, Pharm.D., BCPS, AAHIVP []  Salome Arnt, PharmD, BCPS []  Dimitri Ped, PharmD, BCPS []  Vincenza Hews, PharmD, BCPS  Positive urine culture Treated with Cephalexin, organism sensitive to the same and no further patient follow-up is required at this time.  Cesar Rogerson, Carolynn Comment 03/27/2017, 10:00 AM

## 2017-03-28 ENCOUNTER — Emergency Department (HOSPITAL_COMMUNITY)
Admission: EM | Admit: 2017-03-28 | Discharge: 2017-03-28 | Disposition: A | Discharge status: Home / Self Care | Payer: Self-pay | Attending: Emergency Medicine | Admitting: Emergency Medicine

## 2017-03-28 ENCOUNTER — Encounter (HOSPITAL_COMMUNITY): Payer: Self-pay

## 2017-03-28 ENCOUNTER — Emergency Department (HOSPITAL_COMMUNITY): Payer: Self-pay

## 2017-03-28 DIAGNOSIS — F17210 Nicotine dependence, cigarettes, uncomplicated: Secondary | ICD-10-CM | POA: Insufficient documentation

## 2017-03-28 DIAGNOSIS — R51 Headache: Secondary | ICD-10-CM | POA: Insufficient documentation

## 2017-03-28 DIAGNOSIS — N9489 Other specified conditions associated with female genital organs and menstrual cycle: Secondary | ICD-10-CM | POA: Insufficient documentation

## 2017-03-28 DIAGNOSIS — O99331 Smoking (tobacco) complicating pregnancy, first trimester: Secondary | ICD-10-CM | POA: Insufficient documentation

## 2017-03-28 DIAGNOSIS — O039 Complete or unspecified spontaneous abortion without complication: Secondary | ICD-10-CM | POA: Insufficient documentation

## 2017-03-28 DIAGNOSIS — Z3A01 Less than 8 weeks gestation of pregnancy: Secondary | ICD-10-CM | POA: Insufficient documentation

## 2017-03-28 LAB — URINALYSIS, ROUTINE W REFLEX MICROSCOPIC
Bacteria, UA: NONE SEEN
Bilirubin Urine: NEGATIVE
GLUCOSE, UA: NEGATIVE mg/dL
KETONES UR: NEGATIVE mg/dL
Nitrite: NEGATIVE
Protein, ur: NEGATIVE mg/dL
Specific Gravity, Urine: 1.012 (ref 1.005–1.030)
pH: 5 (ref 5.0–8.0)

## 2017-03-28 LAB — CBC WITH DIFFERENTIAL/PLATELET
Basophils Absolute: 0 10*3/uL (ref 0.0–0.1)
Basophils Relative: 0 %
Eosinophils Absolute: 0.1 10*3/uL (ref 0.0–0.7)
Eosinophils Relative: 1 %
HEMATOCRIT: 31 % — AB (ref 36.0–46.0)
HEMOGLOBIN: 10.3 g/dL — AB (ref 12.0–15.0)
LYMPHS ABS: 4.4 10*3/uL — AB (ref 0.7–4.0)
LYMPHS PCT: 29 %
MCH: 24.4 pg — AB (ref 26.0–34.0)
MCHC: 33.2 g/dL (ref 30.0–36.0)
MCV: 73.5 fL — AB (ref 78.0–100.0)
MONOS PCT: 8 %
Monocytes Absolute: 1.1 10*3/uL — ABNORMAL HIGH (ref 0.1–1.0)
NEUTROS ABS: 9.5 10*3/uL — AB (ref 1.7–7.7)
NEUTROS PCT: 62 %
Platelets: 533 10*3/uL — ABNORMAL HIGH (ref 150–400)
RBC: 4.22 MIL/uL (ref 3.87–5.11)
RDW: 17.7 % — ABNORMAL HIGH (ref 11.5–15.5)
WBC: 15.2 10*3/uL — ABNORMAL HIGH (ref 4.0–10.5)

## 2017-03-28 LAB — BASIC METABOLIC PANEL
Anion gap: 9 (ref 5–15)
BUN: 9 mg/dL (ref 6–20)
CHLORIDE: 100 mmol/L — AB (ref 101–111)
CO2: 26 mmol/L (ref 22–32)
CREATININE: 0.77 mg/dL (ref 0.44–1.00)
Calcium: 9.4 mg/dL (ref 8.9–10.3)
GFR calc Af Amer: >60 mL/min (ref >60)
GFR calc non Af Amer: >60 mL/min (ref >60)
Glucose, Bld: 106 mg/dL — ABNORMAL HIGH (ref 65–99)
POTASSIUM: 3.5 mmol/L (ref 3.5–5.1)
Sodium: 135 mmol/L (ref 135–145)

## 2017-03-28 LAB — HCG, QUANTITATIVE, PREGNANCY: HCG, BETA CHAIN, QUANT, S: 57 m[IU]/mL — AB (ref <5)

## 2017-03-28 MED ORDER — ACETAMINOPHEN 325 MG PO TABS
650.0000 mg | ORAL_TABLET | Freq: Once | ORAL | Status: AC
Start: 2017-03-28 — End: 2017-03-28
  Administered 2017-03-28: 650 mg via ORAL
  Filled 2017-03-28: qty 2

## 2017-03-28 NOTE — ED Notes (Signed)
Pelvic ready at bedside, Pt given cup of water

## 2017-03-28 NOTE — ED Provider Notes (Signed)
[facility] Provider Note   CSN: [id number] Arrival date & time: 03/28/17  2002 By signing my name below, I, Dyke Brackett, attest that this documentation has been prepared under the direction and in the presence of Darlys Buis, Corene Cornea, MD . Electronically Signed: Dyke Brackett, Scribe. 03/28/2017. 9:38 PM.   History   Chief Complaint Chief Complaint  Patient presents with  . Abdominal Pain    HPI Joann White is an approximately 2-[redacted] weeks pregnant 41 y.o. female who presents to the Emergency Department complaining of sudden onset, progressively improving, cramping abdominal pain which began today at 2 pm. She reports associated vaginal bleeding, headache, chills, neck pain, back pain, shortness of breath, nausea, diarrhea, numbness and tingling to her hands and feet, and lightheadedness. She states she noticed heavy vaginal bleeding and went to the bathroom. Once she was in the restroom, she became nauseous and started having diarrhea. Per pt, her hands and feet were numb, and she felt as if she couldn't stand. Per pt, she is now only experiencing a headache and lower back pain. She states her other symptoms have resolved. No alleviating or modifying factors noted. No OTC treatments tried for these symptoms PTA. Per chart review, pt was seen in the ED for vaginal bleeding on 03/25/17; her transabdominal US did not show definitive intrauterine pregnancy at that time. Pt was also prescribed Keflex for a urinary tract infection. Pt is not currently on birth control and was not trying to conceive. No LOC or fever. She has no other acute complaints or associated symptoms at this time.   The history is provided by the patient. No language interpreter was used.    Past Medical History:  Diagnosis Date  . Allergic rhinitis   . Chickenpox   . Chronic daily headache    with vision changes and muffled hearing  . COPD (chronic obstructive pulmonary disease) ([place])   . Dizziness     Patient  Active Problem List   Diagnosis Date Noted  . Dysuria 12/09/2015  . Missed period 11/23/2015  . Tobacco use 11/21/2015  . Essential hypertension 11/16/2015  . Chest pain 11/16/2015  . Headache 11/16/2015  . Swallowing difficulty 11/16/2015    Past Surgical History:  Procedure Laterality Date  . None      OB History    Gravida Para Term Preterm AB Living   1             SAB TAB Ectopic Multiple Live Births                   Home Medications    Prior to Admission medications   Medication Sig Start Date End Date Taking? Authorizing Provider  albuterol (PROVENTIL HFA;VENTOLIN HFA) 108 (90 BASE) MCG/ACT inhaler Inhale 2 puffs into the lungs every 6 (six) hours as needed. Reported on 11/14/2015   Yes [provider]  amLODipine (NORVASC) 5 MG tablet TAKE ONE TABLET BY MOUTH ONCE DAILY 02/10/16  Yes Leone Haven, MD  cephALEXin (KEFLEX) 500 MG capsule Take 1 capsule (500 mg total) by mouth 2 (two) times daily. 03/25/17  Yes Elnora Morrison, MD  ferrous sulfate 325 (65 FE) MG tablet Take 1 tablet (325 mg total) by mouth daily with breakfast. 11/21/15  Yes Leone Haven, MD    Family History Family History  Problem Relation Age of Onset  . Alcoholism Father   . Lung cancer Father   . Healthy Mother   . Arthritis Unknown  Parent, grandparent  . Heart disease Unknown        Parent, grandparent  . Hypertension Unknown        Parent, grandparent  . Diabetes Unknown        Maternal grandmother and grandfather   . Breast cancer Paternal Aunt   . Lung cancer Paternal Aunt   . Stroke Paternal Barbaraann Rondo        First cousin as well  . Sickle cell anemia Unknown        Half-brother  . Healthy Son     Social History Social History  Substance Use Topics  . Smoking status: Current Some Day Smoker    Packs/day: 0.50    Years: 18.00    Types: Cigarettes    Last attempt to quit: 12/31/2015  . Smokeless tobacco: Never Used  . Alcohol use 0.0 oz/week     Comment:  rarely     Allergies   Hydromorphone   Review of Systems Review of Systems  Constitutional: Negative for fever.  Respiratory: Positive for shortness of breath.   Gastrointestinal: Positive for abdominal pain, diarrhea and nausea.  Genitourinary: Positive for vaginal bleeding.  Musculoskeletal: Positive for back pain and neck pain.  Neurological: Positive for light-headedness, numbness and headaches. Negative for syncope.  All other systems reviewed and are negative.  Physical Exam Updated Vital Signs BP (!) 127/97 (BP Location: Right Arm)   Pulse 81   Temp 97.8 F (36.6 C) (Oral)   Resp 18   Ht 5\' 6"  (1.676 m)   Wt 81.6 kg (180 lb)   LMP 02/16/2017   SpO2 100%   BMI 29.05 kg/m   Physical Exam  Constitutional: She is oriented to person, place, and time. She appears well-developed and well-nourished. No distress.  HENT:  Head: Normocephalic and atraumatic.  Eyes: Conjunctivae are normal.  Cardiovascular: Normal rate.   Pulmonary/Chest: Effort normal.  Abdominal: She exhibits no distension.  Neurological: She is alert and oriented to person, place, and time. She displays normal reflexes. No cranial nerve deficit or sensory deficit. Coordination normal.  Skin: Skin is warm and dry.  Psychiatric: She has a normal mood and affect.  Nursing note and vitals reviewed.   ED Treatments / Results  DIAGNOSTIC STUDIES:  Oxygen Saturation is 100% on RA, normal by my interpretation.    COORDINATION OF CARE:  9:21 PM Discussed treatment plan with pt at bedside and pt agreed to plan.   Labs (all labs ordered are listed, but only abnormal results are displayed) Labs Reviewed  HCG, QUANTITATIVE, PREGNANCY - Abnormal; Notable for the following:       Result Value   hCG, Beta Chain, Quant, S 57 (*)    All other components within normal limits  CBC WITH DIFFERENTIAL/PLATELET - Abnormal; Notable for the following:    WBC 15.2 (*)    Hemoglobin 10.3 (*)    HCT 31.0 (*)    MCV  73.5 (*)    MCH 24.4 (*)    RDW 17.7 (*)    Platelets 533 (*)    Neutro Abs 9.5 (*)    Lymphs Abs 4.4 (*)    Monocytes Absolute 1.1 (*)    All other components within normal limits  BASIC METABOLIC PANEL - Abnormal; Notable for the following:    Chloride 100 (*)    Glucose, Bld 106 (*)    All other components within normal limits  URINALYSIS, ROUTINE W REFLEX MICROSCOPIC - Abnormal; Notable for the following:  Hgb urine dipstick LARGE (*)    Leukocytes, UA MODERATE (*)    Squamous Epithelial / LPF 0-5 (*)    All other components within normal limits  RPR  HIV ANTIBODY (ROUTINE TESTING)  WET PREP  (BD AFFIRM) ([place])  GC/CHLAMYDIA PROBE AMP ([place]) NOT AT [facility]    EKG  EKG Interpretation None       Radiology Ct Head Wo Contrast  Result Date: 03/28/2017 CLINICAL DATA:  Rapid onset headaches EXAM: CT HEAD WITHOUT CONTRAST TECHNIQUE: Contiguous axial images were obtained from the base of the skull through the vertex without intravenous contrast. Patient was shielded due to current positive pregnancy test COMPARISON:  None. FINDINGS: Brain: No evidence of acute infarction, hemorrhage, hydrocephalus, extra-axial collection or mass lesion/mass effect. Vascular: No hyperdense vessel or unexpected calcification. Skull: Normal. Negative for fracture or focal lesion. Sinuses/Orbits: No acute finding. Other: None. IMPRESSION: No acute abnormality noted. Electronically Signed   By: Inez Catalina M.D.   On: 03/28/2017 21:50    Procedures Procedures (including critical care time)  Medications Ordered in ED Medications  acetaminophen (TYLENOL) tablet 650 mg (650 mg Oral Given 03/28/17 2147)     Initial Impression / Assessment and Plan / ED Course  I have reviewed the triage vital signs and the nursing notes.  Pertinent labs & imaging results that were available during my care of the patient were reviewed by me and considered in my medical decision making (see chart for  details).     Miscarriage. Bleeding has pretty much stopped. Suspect other symptoms are likely related to some type of vasovagal event related to cramping and passing blood along with anxiety.  Final Clinical Impressions(s) / ED Diagnoses   Final diagnoses:  Miscarriage    New Prescriptions Discharge Medication List as of 03/28/2017 10:44 PM     I personally performed the services described in this documentation, which was scribed in my presence. The recorded information has been reviewed and is accurate.    Kemper Heupel, Corene Cornea, MD 03/29/17 [phone]

## 2017-03-28 NOTE — ED Triage Notes (Signed)
Patient states she thinks for is 2-[redacted] weeks pregnant and possible had a miscarriage earlier today. States she had a "heavy flow" and "passed tissue". Complains of headache and left lower abdominal pain.

## 2017-03-30 LAB — HIV ANTIBODY (ROUTINE TESTING W REFLEX): HIV Screen 4th Generation wRfx: NONREACTIVE

## 2017-03-30 LAB — RPR: RPR Ser Ql: NONREACTIVE

## 2017-05-04 ENCOUNTER — Ambulatory Visit (INDEPENDENT_AMBULATORY_CARE_PROVIDER_SITE_OTHER): Payer: No Typology Code available for payment source | Admitting: Women's Health

## 2017-05-04 ENCOUNTER — Encounter: Payer: Self-pay | Admitting: Women's Health

## 2017-05-04 VITALS — BP 150/92 | HR 115 | Ht 66.0 in | Wt 186.0 lb

## 2017-05-04 DIAGNOSIS — N949 Unspecified condition associated with female genital organs and menstrual cycle: Secondary | ICD-10-CM | POA: Diagnosis not present

## 2017-05-04 DIAGNOSIS — O039 Complete or unspecified spontaneous abortion without complication: Secondary | ICD-10-CM

## 2017-05-04 DIAGNOSIS — N9489 Other specified conditions associated with female genital organs and menstrual cycle: Secondary | ICD-10-CM

## 2017-05-04 DIAGNOSIS — N920 Excessive and frequent menstruation with regular cycle: Secondary | ICD-10-CM | POA: Diagnosis not present

## 2017-05-04 DIAGNOSIS — Z3009 Encounter for other general counseling and advice on contraception: Secondary | ICD-10-CM | POA: Diagnosis not present

## 2017-05-04 DIAGNOSIS — D649 Anemia, unspecified: Secondary | ICD-10-CM | POA: Insufficient documentation

## 2017-05-04 DIAGNOSIS — D259 Leiomyoma of uterus, unspecified: Secondary | ICD-10-CM | POA: Insufficient documentation

## 2017-05-04 NOTE — Patient Instructions (Signed)
Condoms x 2 weeks  Medroxyprogesterone injection [Contraceptive] What is this medicine? MEDROXYPROGESTERONE (me DROX ee proe JES te rone) contraceptive injections prevent pregnancy. They provide effective birth control for 3 months. Depo-subQ Provera 104 is also used for treating pain related to endometriosis. This medicine may be used for other purposes; ask your health care provider or pharmacist if you have questions. COMMON BRAND NAME(S): Depo-Provera, Depo-subQ Provera 104 What should I tell my health care provider before I take this medicine? They need to know if you have any of these conditions: -frequently drink alcohol -asthma -blood vessel disease or a history of a blood clot in the lungs or legs -bone disease such as osteoporosis -breast cancer -diabetes -eating disorder (anorexia nervosa or bulimia) -high blood pressure -HIV infection or AIDS -kidney disease -liver disease -mental depression -migraine -seizures (convulsions) -stroke -tobacco smoker -vaginal bleeding -an unusual or allergic reaction to medroxyprogesterone, other hormones, medicines, foods, dyes, or preservatives -pregnant or trying to get pregnant -breast-feeding How should I use this medicine? Depo-Provera Contraceptive injection is given into a muscle. Depo-subQ Provera 104 injection is given under the skin. These injections are given by a health care professional. You must not be pregnant before getting an injection. The injection is usually given during the first 5 days after the start of a menstrual period or 6 weeks after delivery of a baby. Talk to your pediatrician regarding the use of this medicine in children. Special care may be needed. These injections have been used in female children who have started having menstrual periods. Overdosage: If you think you have taken too much of this medicine contact a poison control center or emergency room at once. NOTE: This medicine is only for you. Do not  share this medicine with others. What if I miss a dose? Try not to miss a dose. You must get an injection once every 3 months to maintain birth control. If you cannot keep an appointment, call and reschedule it. If you wait longer than 13 weeks between Depo-Provera contraceptive injections or longer than 14 weeks between Depo-subQ Provera 104 injections, you could get pregnant. Use another method for birth control if you miss your appointment. You may also need a pregnancy test before receiving another injection. What may interact with this medicine? Do not take this medicine with any of the following medications: -bosentan This medicine may also interact with the following medications: -aminoglutethimide -antibiotics or medicines for infections, especially rifampin, rifabutin, rifapentine, and griseofulvin -aprepitant -barbiturate medicines such as phenobarbital or primidone -bexarotene -carbamazepine -medicines for seizures like ethotoin, felbamate, oxcarbazepine, phenytoin, topiramate -modafinil -St. John's wort This list may not describe all possible interactions. Give your health care provider a list of all the medicines, herbs, non-prescription drugs, or dietary supplements you use. Also tell them if you smoke, drink alcohol, or use illegal drugs. Some items may interact with your medicine. What should I watch for while using this medicine? This drug does not protect you against HIV infection (AIDS) or other sexually transmitted diseases. Use of this product may cause you to lose calcium from your bones. Loss of calcium may cause weak bones (osteoporosis). Only use this product for more than 2 years if other forms of birth control are not right for you. The longer you use this product for birth control the more likely you will be at risk for weak bones. Ask your health care professional how you can keep strong bones. You may have a change in bleeding pattern or irregular periods.  Many  females stop having periods while taking this drug. If you have received your injections on time, your chance of being pregnant is very low. If you think you may be pregnant, see your health care professional as soon as possible. Tell your health care professional if you want to get pregnant within the next year. The effect of this medicine may last a long time after you get your last injection. What side effects may I notice from receiving this medicine? Side effects that you should report to your doctor or health care professional as soon as possible: -allergic reactions like skin rash, itching or hives, swelling of the face, lips, or tongue -breast tenderness or discharge -breathing problems -changes in vision -depression -feeling faint or lightheaded, falls -fever -pain in the abdomen, chest, groin, or leg -problems with balance, talking, walking -unusually weak or tired -yellowing of the eyes or skin Side effects that usually do not require medical attention (report to your doctor or health care professional if they continue or are bothersome): -acne -fluid retention and swelling -headache -irregular periods, spotting, or absent periods -temporary pain, itching, or skin reaction at site where injected -weight gain This list may not describe all possible side effects. Call your doctor for medical advice about side effects. You may report side effects to FDA at 1-800-FDA-1088. Where should I keep my medicine? This does not apply. The injection will be given to you by a health care professional. NOTE: This sheet is a summary. It may not cover all possible information. If you have questions about this medicine, talk to your doctor, pharmacist, or health care provider.  2018 Elsevier/Gold Standard (2008-11-15 18:37:56)

## 2017-05-04 NOTE — Progress Notes (Signed)
[facility] Visit  Patient name: Joann White MRN [id number]  Date of birth: 10/08/1976  CC & HPI:  Joann White is a 41 y.o. [phone] African American female presenting today wanting to get on birth control. Recently had miscarriage.  Was seen in ED 03/25/17 for cramping/spotting x 2d w/ +HPT, BHCG 216  U/S Report: No intrauterine gestation is seen. 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 In the left ovary, there is a slightly irregular cystic structure measuring 4.4 x 2.0 x 2.3 cm. There is no wall thickening in this structure. There is no extra uterine gestation seen currently. No free pelvic fluid. Differential considerations given positive beta HCG examination and these finding include intrauterine gestation too early to be seen by either transabdominal or transvaginal technique; recent spontaneous abortion ; possible ectopic gestation. Ectopic gestation does remain as a differential consideration. This circumstance warrants close clinical and imaging surveillance. Timing of repeat ultrasound will in large part depend on beta HCG values going forward. The cystic mass in the left adnexal region will warrant continued imaging surveillance.  Pt did not have any f/u after this. States cramping/bleeding continued x 1wk, then stopped. Period started 6/20, was heavy x 4d- had to wear 3 pads at a time and changed q 2hrs w/ grape-sized clots and cramping; then lightened for last 3 days. Periods have been heavy x 25yr. Does know about fibroids.  She really doesn't want another baby, was not intending on that pregnancy- actually thought she was going through early menopausal stages. States she can not take another miscarriage. However, her partner who is 59 does want another child, and is not ready for her to do anything permanent.  Last sex ~2wks ago Pt also has CHTN, on Norvasc, didn't take meds today.  Patient's last menstrual period was 04/27/2017 (exact date).  The current method  of family planning is none. Last pap unsure, but thinks it was definitely >1yrs ago  Pertinent History Reviewed:  Medical & Surgical Hx:   Past medical, surgical, family, and social history reviewed in electronic medical record Medications: Reviewed & Updated - see associated section Allergies: Reviewed in electronic medical record  Objective Findings:  Vitals: BP (!) 150/92 (BP Location: Right Arm, Patient Position: Sitting, Cuff Size: Normal)   Pulse (!) 115   Ht 5\' 6"  (1.676 m)   Wt 186 lb (84.4 kg)   LMP 04/27/2017 (Exact Date)   BMI 30.02 kg/m  Body mass index is 30.02 kg/m.  Physical Examination: General appearance - alert, well appearing, and in no distress  No results found for this or any previous visit (from the past 24 hour(s)).   Assessment & Plan:  A:   Contraception counseling  Recent miscarriage, Rh+  Menorrhagia  Uterine fibroids and cystic mass Lt adnexal region  [facility]  Past due for pap  Anemia  P:  Recheck HCG, CBC today  Discussed miscarriage likely completed, do not feel like cystic mass Lt adnexal region represents ectopic pregnancy- however, we will repeat u/s   Discussed permanent options for contraception/menorrhagia such as hysterectomy/ablation, however pt states her partner is not ready for anything permanent at this time; pt desires some form of contraception now while we are awaiting visit w/ MD. Discussed bp elevated, will not rx estrogen containing contraception today- recommended depo. Will get bhcg today, as long as neg- will rx depo tomorrow. Condoms x 2wks if she has sex.   Return for asap for pelvic u/s and same day  for pap & physical w/ MD.  Tawnya Crook CNM, [facility] 05/04/2017 4:03 PM

## 2017-05-05 ENCOUNTER — Telehealth: Payer: Self-pay | Admitting: *Deleted

## 2017-05-05 ENCOUNTER — Telehealth: Payer: Self-pay | Admitting: Women's Health

## 2017-05-05 LAB — CBC
Hematocrit: 35 % (ref 34.0–46.6)
Hemoglobin: 11.4 g/dL (ref 11.1–15.9)
MCH: 24.6 pg — ABNORMAL LOW (ref 26.6–33.0)
MCHC: 32.6 g/dL (ref 31.5–35.7)
MCV: 75 fL — AB (ref 79–97)
Platelets: 262 10*3/uL (ref 150–379)
RBC: 4.64 x10E6/uL (ref 3.77–5.28)
RDW: 19.5 % — ABNORMAL HIGH (ref 12.3–15.4)
WBC: 8.1 10*3/uL (ref 3.4–10.8)

## 2017-05-05 LAB — BETA HCG QUANT (REF LAB): hCG Quant: <1 m[IU]/mL

## 2017-05-05 MED ORDER — MEDROXYPROGESTERONE ACETATE 150 MG/ML IM SUSP: 150.0000 mg | INTRAMUSCULAR | 3 refills | Status: DC

## 2017-05-05 NOTE — Telephone Encounter (Signed)
LMOVM that pt could call her pharmacy and get the prescription transferred.

## 2017-05-05 NOTE — Telephone Encounter (Signed)
LM for pt. Per our discussion yesterday, it is OK to leave details on voice mail. Notified her bhcg <1, so rx sent for depo as discussed today- pt to call office back to make appt w/ nurse for injection- no sex until after gets injection. Hgb was normal. BHCG <1 indicated completed SAB, so I do not feel she needs an u/s to re-evaluate cystic structure Lt adnexa right now, as last u/s was 03/25/17. Keep appt 7/3 as scheduled w/ Dr. Elonda Husky to discuss fibroids/cystic structure/options, etc.  Roma Schanz, CNM, [facility] 05/05/2017 1:17 PM

## 2017-05-06 LAB — GC/CHLAMYDIA PROBE AMP
Chlamydia trachomatis, NAA: NEGATIVE
NEISSERIA GONORRHOEAE BY PCR: NEGATIVE

## 2017-05-10 ENCOUNTER — Other Ambulatory Visit: Payer: No Typology Code available for payment source

## 2017-05-10 ENCOUNTER — Other Ambulatory Visit: Payer: No Typology Code available for payment source | Admitting: Obstetrics & Gynecology

## 2017-05-18 ENCOUNTER — Ambulatory Visit (INDEPENDENT_AMBULATORY_CARE_PROVIDER_SITE_OTHER): Payer: No Typology Code available for payment source | Admitting: Obstetrics and Gynecology

## 2017-05-18 DIAGNOSIS — Z5329 Procedure and treatment not carried out because of patient's decision for other reasons: Secondary | ICD-10-CM

## 2017-05-18 NOTE — Progress Notes (Signed)
Canceled appointment patient

## 2017-05-30 ENCOUNTER — Other Ambulatory Visit: Payer: No Typology Code available for payment source | Admitting: Obstetrics and Gynecology

## 2017-06-02 ENCOUNTER — Other Ambulatory Visit (HOSPITAL_COMMUNITY)
Admission: RE | Admit: 2017-06-02 | Discharge: 2017-06-02 | Disposition: A | Discharge status: Home / Self Care | Payer: No Typology Code available for payment source | Source: Ambulatory Visit | Attending: Obstetrics & Gynecology | Admitting: Obstetrics & Gynecology

## 2017-06-02 ENCOUNTER — Encounter: Payer: Self-pay | Admitting: Obstetrics and Gynecology

## 2017-06-02 ENCOUNTER — Ambulatory Visit (INDEPENDENT_AMBULATORY_CARE_PROVIDER_SITE_OTHER): Payer: No Typology Code available for payment source | Admitting: Obstetrics and Gynecology

## 2017-06-02 VITALS — BP 132/84 | HR 96 | Ht 66.0 in | Wt 185.0 lb

## 2017-06-02 DIAGNOSIS — Z01419 Encounter for gynecological examination (general) (routine) without abnormal findings: Secondary | ICD-10-CM | POA: Insufficient documentation

## 2017-06-02 NOTE — Progress Notes (Signed)
Assessment:  Annual Gyn Exam   Plan:  1. pap smear done, next pap due 3 2. return annually or prn 3    Annual mammogram advised 4. Referral to [facility] specialist  Subjective:  Joann White is a 41 y.o. female [phone] who presents for annual exam. Patient's last menstrual period was 05/22/2017 (exact date). The patient has complaints no pertinent complaints. Pt had recent miscarriage on 03/25/17 and was seen in ED. She does not use any BC currently. She states her husband of 6 years wants another child, however she is unsure of what she wants.  [id number]   The following portions of the patient's history were reviewed and updated as appropriate: allergies, current medications, past family history, past medical history, past social history, past surgical history and problem list. Past Medical History:  Diagnosis Date  . Allergic rhinitis   . Chickenpox   . Chronic daily headache    with vision changes and muffled hearing  . COPD (chronic obstructive pulmonary disease) ([place])   . Dizziness   . Hypertension     Past Surgical History:  Procedure Laterality Date  . None       Current Outpatient Prescriptions:  .  albuterol (PROVENTIL HFA;VENTOLIN HFA) 108 (90 BASE) MCG/ACT inhaler, Inhale 2 puffs into the lungs every 6 (six) hours as needed. Reported on 11/14/2015, Disp: , Rfl:  .  amLODipine (NORVASC) 5 MG tablet, TAKE ONE TABLET BY MOUTH ONCE DAILY, Disp: 90 tablet, Rfl: 0 .  ferrous sulfate 325 (65 FE) MG tablet, Take 1 tablet (325 mg total) by mouth daily with breakfast., Disp: 90 tablet, Rfl: 3 .  magnesium oxide (MAG-OX) 400 MG tablet, Take 400 mg by mouth daily., Disp: , Rfl:  .  medroxyPROGESTERone (DEPO-PROVERA) 150 MG/ML injection, Inject 1 mL (150 mg total) into the muscle every 3 (three) months. (Patient not taking: Reported on 06/02/2017), Disp: 1 mL, Rfl: 3  Review of Systems Constitutional: negative Gastrointestinal: negative Genitourinary: negative  Objective:  BP  132/84 (BP Location: Right Arm, Patient Position: Sitting, Cuff Size: Normal)   Pulse 96   Ht 5\' 6"  (1.676 m)   Wt 185 lb (83.9 kg)   LMP 05/22/2017 (Exact Date)   BMI 29.86 kg/m    BMI: Body mass index is 29.86 kg/m.  General Appearance: Alert, appropriate appearance for age. No acute distress HEENT: Grossly normal Neck / Thyroid:  Cardiovascular: RRR; normal S1, S2, no murmur Lungs: CTA bilaterally Back: No CVAT Breast Exam: No masses or nodes.No dimpling, nipple retraction or discharge. Gastrointestinal: Soft, non-tender, no masses or organomegaly Pelvic Exam:  Vulva and vagina appear normal. Bimanual exam reveals normal uterus and adnexa. Asymptomatic bartholin's cyst on right side of vulva. Vaginal: normal mucosa without prolapse or lesions Cervix: not visualized Adnexa: normal bimanual exam Uterus:  anteflexed, 8 week size  Rectovaginal: not indicated Lymphatic Exam: Non-palpable nodes in neck, clavicular, axillary, or inguinal regions  Skin: no rash or abnormalities Neurologic: Normal gait and speech, no tremor  Psychiatric: Alert and oriented, appropriate affect.  Urinalysis:Not done  Joann White. MD Pgr [phone] 12:15 PM    By signing my name below, I, Izna Ahmed, attest that this documentation has been prepared under the direction and in the presence of Jonnie Kind, MD. Electronically Signed: Jabier Gauss, ED Scribe. 06/02/17. 12:15 PM.  I personally performed the services described in this documentation, which was SCRIBED in my presence. The recorded information has been reviewed and considered accurate. It has been edited  as necessary during review. Jonnie Kind, MD

## 2017-06-08 LAB — CYTOLOGY - PAP: HPV (WINDOPATH): NOT DETECTED

## 2017-11-11 ENCOUNTER — Encounter (HOSPITAL_COMMUNITY): Payer: Self-pay | Admitting: *Deleted

## 2017-11-11 ENCOUNTER — Other Ambulatory Visit: Payer: Self-pay

## 2017-11-11 DIAGNOSIS — Z79899 Other long term (current) drug therapy: Secondary | ICD-10-CM | POA: Insufficient documentation

## 2017-11-11 DIAGNOSIS — I10 Essential (primary) hypertension: Secondary | ICD-10-CM | POA: Insufficient documentation

## 2017-11-11 DIAGNOSIS — F17210 Nicotine dependence, cigarettes, uncomplicated: Secondary | ICD-10-CM | POA: Insufficient documentation

## 2017-11-11 DIAGNOSIS — G510 Bell's palsy: Secondary | ICD-10-CM | POA: Insufficient documentation

## 2017-11-11 DIAGNOSIS — J449 Chronic obstructive pulmonary disease, unspecified: Secondary | ICD-10-CM | POA: Insufficient documentation

## 2017-11-11 NOTE — ED Triage Notes (Signed)
Pt reports facial numbness x 3 days. Pt's smile is asymmetrical. Pt denies any other numbness or weakness besides in her big toe on her left foot.

## 2017-11-12 ENCOUNTER — Emergency Department (HOSPITAL_COMMUNITY)
Admission: EM | Admit: 2017-11-12 | Discharge: 2017-11-12 | Disposition: A | Discharge status: Home / Self Care | Payer: No Typology Code available for payment source | Attending: Emergency Medicine | Admitting: Emergency Medicine

## 2017-11-12 DIAGNOSIS — G510 Bell's palsy: Secondary | ICD-10-CM

## 2017-11-12 DIAGNOSIS — I10 Essential (primary) hypertension: Secondary | ICD-10-CM

## 2017-11-12 HISTORY — DX: Bell's palsy: G51.0

## 2017-11-12 MED ORDER — ACYCLOVIR 400 MG PO TABS: 400.0000 mg | ORAL_TABLET | Freq: Every day | ORAL | 0 refills | Status: DC

## 2017-11-12 MED ORDER — PREDNISONE 20 MG PO TABS: ORAL_TABLET | ORAL | 0 refills | Status: DC

## 2017-11-12 MED ORDER — AMLODIPINE BESYLATE 5 MG PO TABS: 5.0000 mg | ORAL_TABLET | Freq: Every day | ORAL | 0 refills | Status: DC

## 2017-11-12 MED ORDER — PREDNISONE 50 MG PO TABS
60.0000 mg | ORAL_TABLET | Freq: Once | ORAL | Status: AC
  Administered 2017-11-12: 60 mg via ORAL
  Filled 2017-11-12: qty 1

## 2017-11-12 MED ORDER — ACYCLOVIR 800 MG PO TABS
800.0000 mg | ORAL_TABLET | Freq: Once | ORAL | Status: AC
  Administered 2017-11-12: 800 mg via ORAL
  Filled 2017-11-12: qty 1

## 2017-11-12 NOTE — Discharge Instructions (Signed)
Take the medication as prescribed. I gave you several numbers of health facilities you can follow up with.  I write for your Norvasc, you need to get a primary care doctor so you can remain on your medication.

## 2017-11-12 NOTE — ED Provider Notes (Signed)
[facility] EMERGENCY DEPARTMENT Provider Note   CSN: [id number] Arrival date & time: 11/11/17  2017  Time seen 02:00 AM    History   Chief Complaint Chief Complaint  Patient presents with  . facial numbness    HPI Joann White is a 42 y.o. female.  HPI patient states on January 2 she woke up and was having trouble opening her right eye.  She also noticed that her left lip was not moving.  She states she has some pain behind her left ear.  She is having some difficulty drinking through a straw or smoking but she is able to use them.  She also states she has a left frontal headache.  She denies any fever or numbness or weakness in her extremities other than saying her left great toe has been numb for a few weeks.  She has chronic low back pain.  She denies any urinary or rectal incontinence.  Patient states she supposed to be on blood pressure medications however she takes half of her husband's pill every now and then.  We discussed that she need to get a primary care doctor.  PCP none  Past Medical History:  Diagnosis Date  . Allergic rhinitis   . Chickenpox   . Chronic daily headache    with vision changes and muffled hearing  . COPD (chronic obstructive pulmonary disease) ([place])   . Dizziness   . Hypertension     Patient Active Problem List   Diagnosis Date Noted  . Menorrhagia 05/04/2017  . Uterine fibroids 05/04/2017  . Anemia 05/04/2017  . Dysuria 12/09/2015  . Tobacco use 11/21/2015  . Essential hypertension 11/16/2015  . Chest pain 11/16/2015  . Headache 11/16/2015  . Swallowing difficulty 11/16/2015    Past Surgical History:  Procedure Laterality Date  . None      OB History    Gravida Para Term Preterm AB Living   4 1 1   3 1    SAB TAB Ectopic Multiple Live Births   2 1             Home Medications    Prior to Admission medications   Medication Sig Start Date End Date Taking? Authorizing Provider  acyclovir (ZOVIRAX) 400 MG tablet Take 1  tablet (400 mg total) by mouth 5 (five) times daily. 11/12/17   Rolland Porter, MD  albuterol (PROVENTIL HFA;VENTOLIN HFA) 108 (90 BASE) MCG/ACT inhaler Inhale 2 puffs into the lungs every 6 (six) hours as needed. Reported on 11/14/2015    [provider]  amLODipine (NORVASC) 5 MG tablet Take 1 tablet (5 mg total) by mouth daily. 11/12/17   Rolland Porter, MD  ferrous sulfate 325 (65 FE) MG tablet Take 1 tablet (325 mg total) by mouth daily with breakfast. 11/21/15   Leone Haven, MD  magnesium oxide (MAG-OX) 400 MG tablet Take 400 mg by mouth daily.    [provider]  medroxyPROGESTERone (DEPO-PROVERA) 150 MG/ML injection Inject 1 mL (150 mg total) into the muscle every 3 (three) months. Patient not taking: Reported on 06/02/2017 05/05/17   Roma Schanz, CNM  predniSONE (DELTASONE) 20 MG tablet Take 3 po QD x 3d , then 2 po QD x 3d then 1 po QD x 3d 11/12/17   Rolland Porter, MD    Family History Family History  Problem Relation Age of Onset  . Alcoholism Father   . Lung cancer Father   . Cancer Father  lung  . Healthy Mother   . Arthritis Unknown        Parent, grandparent  . Heart disease Unknown        Parent, grandparent  . Hypertension Unknown        Parent, grandparent  . Diabetes Unknown        Maternal grandmother and grandfather   . Breast cancer Paternal Aunt   . Lung cancer Paternal Aunt   . Cancer Paternal Aunt        lung  . Stroke Paternal Barbaraann Rondo        First cousin as well  . Sickle cell anemia Unknown        Half-brother  . Healthy Son   . Sickle cell anemia Brother     Social History Social History   Tobacco Use  . Smoking status: Current Some Day Smoker    Packs/day: 0.50    Years: 18.00    Pack years: 9.00    Types: Cigarettes    Last attempt to quit: 12/31/2015    Years since quitting: 1.8  . Smokeless tobacco: Never Used  Substance Use Topics  . Alcohol use: Yes    Alcohol/week: 0.0 oz    Comment: rarely  . Drug use: Yes     Types: Marijuana    Comment: occasionally  unemployed Smokes 1 ppd   Allergies   Hydromorphone   Review of Systems Review of Systems  All other systems reviewed and are negative.    Physical Exam Updated Vital Signs BP (!) 143/102 (BP Location: Left Arm)   Pulse 84   Temp 98.6 F (37 C) (Oral)   Resp 18   Ht 5\' 6"  (1.676 m)   Wt 79.4 kg (175 lb)   LMP 11/08/2017   SpO2 100%   BMI 28.25 kg/m   Vital signs normal except diastolic hypertension   Physical Exam  Constitutional: She is oriented to person, place, and time. She appears well-developed and well-nourished. No distress.  HENT:  Head: Normocephalic and atraumatic.  Right Ear: External ear normal.  Left Ear: External ear normal.  Nose: Nose normal.  Mouth/Throat: Oropharynx is clear and moist.  Eyes: Conjunctivae and EOM are normal. Pupils are equal, round, and reactive to light.  Neck: Normal range of motion. Neck supple.  Cardiovascular: Normal rate.  Pulmonary/Chest: Effort normal. No respiratory distress.  Neurological: She is alert and oriented to person, place, and time. A cranial nerve deficit is present.  Patient is noted to have no movement of her left eyebrow, her smile is asymmetric with her left face being flaccid.  When I checked the strength of her left eye she cannot keep her eyelid closed on the left.  She argues with me that there is a problem with her right eye however the right eye is normal.  Skin: Skin is warm and dry. Capillary refill takes less than 2 seconds. No rash noted. No erythema. No pallor.  Psychiatric: She has a normal mood and affect. Her behavior is normal. Thought content normal.  Nursing note and vitals reviewed.    ED Treatments / Results  Labs (all labs ordered are listed, but only abnormal results are displayed) Labs Reviewed - No data to display  EKG  EKG Interpretation None       Radiology No results found.  Procedures Procedures (including critical care  time)  Medications Ordered in ED Medications  predniSONE (DELTASONE) tablet 60 mg (not administered)  acyclovir (ZOVIRAX) tablet 800 mg (not  administered)     Initial Impression / Assessment and Plan / ED Course  I have reviewed the triage vital signs and the nursing notes.  Pertinent labs & imaging results that were available during my care of the patient were reviewed by me and considered in my medical decision making (see chart for details).     Patient has Bell's palsy by exam.  I believe the numbness in her great toe is probably related to her chronic back problems.  The steroids that would be used for the Bell's palsy should also help with her tongue numbness from her back problems.  Patient does not have a PCP, she states she does not have insurance.  She was given the number for the free clinic and the rocking him [place].  She lives in [place] I would advise her on looking into the [facility] clinic.  We discussed that most people improve over the next several weeks to several months.  However some people do have permanent weakness.  Final Clinical Impressions(s) / ED Diagnoses   Final diagnoses:  Left-sided Bell's palsy  Essential hypertension    ED Discharge Orders        Ordered    predniSONE (DELTASONE) 20 MG tablet     11/12/17 0258    acyclovir (ZOVIRAX) 400 MG tablet  5 times daily     11/12/17 0258    amLODipine (NORVASC) 5 MG tablet  Daily     11/12/17 0301      Plan discharge  Rolland Porter, MD, Barbette Or, MD 11/12/17 [phone]

## 2017-11-12 NOTE — ED Notes (Signed)
Pt states unable to control facial muscles for the past 3 days.

## 2017-11-18 ENCOUNTER — Emergency Department (HOSPITAL_COMMUNITY): Payer: Self-pay

## 2017-11-18 ENCOUNTER — Encounter (HOSPITAL_COMMUNITY): Payer: Self-pay | Admitting: Emergency Medicine

## 2017-11-18 ENCOUNTER — Emergency Department (HOSPITAL_COMMUNITY)
Admission: EM | Admit: 2017-11-18 | Discharge: 2017-11-18 | Disposition: A | Discharge status: Home / Self Care | Payer: Self-pay | Attending: Emergency Medicine | Admitting: Emergency Medicine

## 2017-11-18 ENCOUNTER — Other Ambulatory Visit: Payer: Self-pay

## 2017-11-18 DIAGNOSIS — F17210 Nicotine dependence, cigarettes, uncomplicated: Secondary | ICD-10-CM | POA: Insufficient documentation

## 2017-11-18 DIAGNOSIS — J449 Chronic obstructive pulmonary disease, unspecified: Secondary | ICD-10-CM | POA: Insufficient documentation

## 2017-11-18 DIAGNOSIS — I10 Essential (primary) hypertension: Secondary | ICD-10-CM | POA: Insufficient documentation

## 2017-11-18 DIAGNOSIS — G510 Bell's palsy: Secondary | ICD-10-CM | POA: Insufficient documentation

## 2017-11-18 MED ORDER — KETOROLAC TROMETHAMINE 60 MG/2ML IM SOLN
60.0000 mg | Freq: Once | INTRAMUSCULAR | Status: AC
  Administered 2017-11-18: 60 mg via INTRAMUSCULAR
  Filled 2017-11-18: qty 2

## 2017-11-18 MED ORDER — IBUPROFEN 800 MG PO TABS: 800.0000 mg | ORAL_TABLET | Freq: Three times a day (TID) | ORAL | 0 refills | Status: DC

## 2017-11-18 MED ORDER — KETOROLAC TROMETHAMINE 30 MG/ML IJ SOLN: 30.0000 mg | Freq: Once | INTRAMUSCULAR | Status: DC

## 2017-11-18 NOTE — ED Notes (Signed)
ED Provider at bedside. 

## 2017-11-18 NOTE — ED Notes (Signed)
Pt stable, ambulatory, states understanding of discharge instructions 

## 2017-11-18 NOTE — ED Notes (Signed)
Pt also reports to this RN she has been feeling very depressed since this symptoms begin, denies SI /HI just feels hopeless.

## 2017-11-18 NOTE — ED Triage Notes (Signed)
Pt was diagnosed with bells palsy on Friday due to left sided facial droop and facial numbness. Pt still has left sided facial droop today and left sided facial numbness but yesterday at 12pm she developed lefty sided "tingling and numbness in left arm and left leg". Pt has no drift but possibly slightly weaker left side grip strength. Pt reports she is having difficultly speaking and has to speak slow and concentrate on her words. Pt is alert and ox4.

## 2017-11-18 NOTE — ED Provider Notes (Signed)
[facility] DEPARTMENT Provider Note   CSN: [id number] Arrival date & time: 11/18/17  0548     History   Chief Complaint Chief Complaint  Patient presents with  . Numbness    HPI Joann White is a 42 y.o. female.  HPI  42 year old female, has had several days of left-sided facial droop, left-sided facial numbness and the inability to close her left eye, diagnosed with a Bell's palsy, in the last 24 hours has developed progressive numbness on the left side of her body including her arm and her leg which she did not have at her initial presentation.  She has no symptoms on the right side however she does have some neck pain which is initially on the left that is now become bilateral.  Nothing makes this better or worse, no associated difficulty with vision, she denies any difficulty with speech or swallowing though she does have some difficulty with her facial strength and has some difficulty with lips and drinking.  She has no change in memory cognition has no chest pain shortness of breath abdominal pain fevers chills nausea vomiting or diarrhea.  She has been taking the prednisone and acyclovir as prescribed  Past Medical History:  Diagnosis Date  . Allergic rhinitis   . Chickenpox   . Chronic daily headache    with vision changes and muffled hearing  . COPD (chronic obstructive pulmonary disease) ([place])   . Dizziness   . Hypertension     Patient Active Problem List   Diagnosis Date Noted  . Menorrhagia 05/04/2017  . Uterine fibroids 05/04/2017  . Anemia 05/04/2017  . Dysuria 12/09/2015  . Tobacco use 11/21/2015  . Essential hypertension 11/16/2015  . Chest pain 11/16/2015  . Headache 11/16/2015  . Swallowing difficulty 11/16/2015    Past Surgical History:  Procedure Laterality Date  . None      OB History    Gravida Para Term Preterm AB Living   4 1 1   3 1    SAB TAB Ectopic Multiple Live Births   2 1             Home  Medications    Prior to Admission medications   Medication Sig Start Date End Date Taking? Authorizing Provider  acyclovir (ZOVIRAX) 400 MG tablet Take 1 tablet (400 mg total) by mouth 5 (five) times daily. 11/12/17   Rolland Porter, MD  albuterol (PROVENTIL HFA;VENTOLIN HFA) 108 (90 BASE) MCG/ACT inhaler Inhale 2 puffs into the lungs every 6 (six) hours as needed. Reported on 11/14/2015    [provider]  amLODipine (NORVASC) 5 MG tablet Take 1 tablet (5 mg total) by mouth daily. 11/12/17   Rolland Porter, MD  ferrous sulfate 325 (65 FE) MG tablet Take 1 tablet (325 mg total) by mouth daily with breakfast. 11/21/15   Leone Haven, MD  ibuprofen (ADVIL,MOTRIN) 800 MG tablet Take 1 tablet (800 mg total) by mouth 3 (three) times daily. 11/18/17   Noemi Chapel, MD  medroxyPROGESTERone (DEPO-PROVERA) 150 MG/ML injection Inject 1 mL (150 mg total) into the muscle every 3 (three) months. Patient not taking: Reported on 06/02/2017 05/05/17   Roma Schanz, CNM  predniSONE (DELTASONE) 20 MG tablet Take 3 po QD x 3d , then 2 po QD x 3d then 1 po QD x 3d 11/12/17   Rolland Porter, MD    Family History Family History  Problem Relation Age of Onset  . Alcoholism Father   .  Lung cancer Father   . Cancer Father        lung  . Healthy Mother   . Arthritis Unknown        Parent, grandparent  . Heart disease Unknown        Parent, grandparent  . Hypertension Unknown        Parent, grandparent  . Diabetes Unknown        Maternal grandmother and grandfather   . Breast cancer Paternal Aunt   . Lung cancer Paternal Aunt   . Cancer Paternal Aunt        lung  . Stroke Paternal Barbaraann Rondo        First cousin as well  . Sickle cell anemia Unknown        Half-brother  . Healthy Son   . Sickle cell anemia Brother     Social History Social History   Tobacco Use  . Smoking status: Current Some Day Smoker    Packs/day: 0.50    Years: 18.00    Pack years: 9.00    Types: Cigarettes    Last attempt to  quit: 12/31/2015    Years since quitting: 1.8  . Smokeless tobacco: Never Used  Substance Use Topics  . Alcohol use: Yes    Alcohol/week: 0.0 oz    Comment: rarely  . Drug use: Yes    Types: Marijuana    Comment: occasionally     Allergies   Hydromorphone   Review of Systems Review of Systems  All other systems reviewed and are negative.    Physical Exam Updated Vital Signs BP 108/82   Pulse (!) 57   Temp 98.2 F (36.8 C) (Oral)   Resp 16   LMP 11/08/2017   SpO2 99%   Physical Exam  Constitutional: She appears well-developed and well-nourished. No distress.  HENT:  Head: Normocephalic and atraumatic.  Mouth/Throat: Oropharynx is clear and moist. No oropharyngeal exudate.  Eyes: Conjunctivae and EOM are normal. Pupils are equal, round, and reactive to light. Right eye exhibits no discharge. Left eye exhibits no discharge. No scleral icterus.  Neck: Normal range of motion. Neck supple. No JVD present. No thyromegaly present.  Cardiovascular: Normal rate, regular rhythm, normal heart sounds and intact distal pulses. Exam reveals no gallop and no friction rub.  No murmur heard. Pulmonary/Chest: Effort normal and breath sounds normal. No respiratory distress. She has no wheezes. She has no rales.  Abdominal: Soft. Bowel sounds are normal. She exhibits no distension and no mass. There is no tenderness.  Musculoskeletal: Normal range of motion. She exhibits no edema or tenderness.  Lymphadenopathy:    She has no cervical adenopathy.  Neurological: She is alert. Coordination normal.  Speech is clear and goal-directed, coordination is normal, moves all 4 extremities with normal strength, sensation is intact to light touch and pinprick in all 4 extremities, left-sided facial droop is present, inability to lift the left side of the forehead, inability to close the left eye.  Skin: Skin is warm and dry. No rash noted. No erythema.  Psychiatric: She has a normal mood and affect.  Her behavior is normal.  Nursing note and vitals reviewed.    ED Treatments / Results  Labs (all labs ordered are listed, but only abnormal results are displayed) Labs Reviewed - No data to display   Radiology Mr Brain Wo Contrast  Result Date: 11/18/2017 CLINICAL DATA:  Paresthesia.  Left facial droop EXAM: MRI HEAD WITHOUT CONTRAST TECHNIQUE: Multiplanar, multiecho pulse sequences  of the brain and surrounding structures were obtained without intravenous contrast. COMPARISON:  CT 03/28/2017 FINDINGS: Brain: Ventricle size normal. Negative for infarct, hemorrhage, mass lesion. Normal white matter. Cerebellar tonsils 6 mm below the foramen magnum unchanged from the prior MRI of 12/12/2015 Vascular: Normal arterial flow voids Skull and upper cervical spine: Negative Sinuses/Orbits: Opacification left maxillary sinus, probable retention cyst. Remaining sinuses clear. Mastoid sinus clear bilaterally. Negative orbit. Other: None IMPRESSION: Mild Chiari malformation. Cerebellar tonsils 6 mm below the foramen magnum. Otherwise negative MRI head Large cyst filling the left maxillary antrum. Electronically Signed   By: Franchot Gallo M.D.   On: 11/18/2017 08:42    Procedures Procedures (including critical care time)  Medications Ordered in ED Medications  ketorolac (TORADOL) injection 60 mg (not administered)     Initial Impression / Assessment and Plan / ED Course  I have reviewed the triage vital signs and the nursing notes.  Pertinent labs & imaging results that were available during my care of the patient were reviewed by me and considered in my medical decision making (see chart for details).     Seems to be Bell's palsy however with a subjective sense of numbness and tingling on the left side of the arm and the leg we will obtain an MRI of the brain to rule out an acute stroke though this seems less likely will necessarily be ruled out.  MRI normal, there is a Chiari malformation but  no signs of stroke tumor or aneurysm.  Patient informed of results, stable for discharge  Final Clinical Impressions(s) / ED Diagnoses   Final diagnoses:  Bell's palsy    ED Discharge Orders        Ordered    ibuprofen (ADVIL,MOTRIN) 800 MG tablet  3 times daily     11/18/17 1100       Noemi Chapel, MD 11/18/17 1101

## 2017-11-18 NOTE — Discharge Instructions (Signed)
Your testing looks normal, no signs of stroke, you do have a Chiari malformation but you can follow-up with a neurologist for this.  Please see the phone number above for local neurology follow-up, make an appointment for the next month

## 2018-11-08 ENCOUNTER — Encounter (HOSPITAL_COMMUNITY): Payer: Self-pay | Admitting: Emergency Medicine

## 2018-11-08 ENCOUNTER — Other Ambulatory Visit: Payer: Self-pay

## 2018-11-08 ENCOUNTER — Emergency Department (HOSPITAL_COMMUNITY)
Admission: EM | Admit: 2018-11-08 | Discharge: 2018-11-08 | Disposition: A | Discharge status: Home / Self Care | Payer: Self-pay | Attending: Emergency Medicine | Admitting: Emergency Medicine

## 2018-11-08 DIAGNOSIS — Z79899 Other long term (current) drug therapy: Secondary | ICD-10-CM | POA: Insufficient documentation

## 2018-11-08 DIAGNOSIS — I10 Essential (primary) hypertension: Secondary | ICD-10-CM | POA: Insufficient documentation

## 2018-11-08 DIAGNOSIS — M5430 Sciatica, unspecified side: Secondary | ICD-10-CM

## 2018-11-08 DIAGNOSIS — J449 Chronic obstructive pulmonary disease, unspecified: Secondary | ICD-10-CM | POA: Insufficient documentation

## 2018-11-08 DIAGNOSIS — F17210 Nicotine dependence, cigarettes, uncomplicated: Secondary | ICD-10-CM | POA: Insufficient documentation

## 2018-11-08 DIAGNOSIS — M5440 Lumbago with sciatica, unspecified side: Secondary | ICD-10-CM | POA: Insufficient documentation

## 2018-11-08 DIAGNOSIS — M545 Low back pain, unspecified: Secondary | ICD-10-CM

## 2018-11-08 HISTORY — DX: Sciatica, unspecified side: M54.30

## 2018-11-08 HISTORY — DX: Low back pain, unspecified: M54.50

## 2018-11-08 LAB — URINALYSIS, ROUTINE W REFLEX MICROSCOPIC
BILIRUBIN URINE: NEGATIVE
GLUCOSE, UA: NEGATIVE mg/dL
Hgb urine dipstick: NEGATIVE
KETONES UR: NEGATIVE mg/dL
LEUKOCYTES UA: NEGATIVE
NITRITE: NEGATIVE
PH: 7 (ref 5.0–8.0)
Protein, ur: NEGATIVE mg/dL

## 2018-11-08 LAB — PREGNANCY, URINE: Preg Test, Ur: NEGATIVE

## 2018-11-08 MED ORDER — DICLOFENAC SODIUM 75 MG PO TBEC: 75.0000 mg | DELAYED_RELEASE_TABLET | Freq: Two times a day (BID) | ORAL | 0 refills | Status: DC

## 2018-11-08 MED ORDER — PREDNISONE 50 MG PO TABS: 50.0000 mg | ORAL_TABLET | Freq: Every day | ORAL | 0 refills | Status: DC

## 2018-11-08 MED ORDER — METHOCARBAMOL 500 MG PO TABS: 500.0000 mg | ORAL_TABLET | Freq: Two times a day (BID) | ORAL | 0 refills | Status: DC

## 2018-11-08 NOTE — ED Provider Notes (Signed)
[facility] EMERGENCY DEPARTMENT Provider Note   CSN: [id number] Arrival date & time: 11/08/18  1550     History   Chief Complaint Chief Complaint  Patient presents with  . Flank Pain    HPI Joann White is a 43 y.o. female.  The history is provided by the patient. No language interpreter was used.  Flank Pain  This is a new problem. Episode onset: 5 days. The problem occurs constantly. The problem has been gradually worsening. Associated symptoms include abdominal pain. Nothing aggravates the symptoms. Nothing relieves the symptoms. The treatment provided no relief.    Past Medical History:  Diagnosis Date  . Allergic rhinitis   . Chickenpox   . Chronic daily headache    with vision changes and muffled hearing  . COPD (chronic obstructive pulmonary disease) ([place])   . Dizziness   . Hypertension     Patient Active Problem List   Diagnosis Date Noted  . Menorrhagia 05/04/2017  . Uterine fibroids 05/04/2017  . Anemia 05/04/2017  . Dysuria 12/09/2015  . Tobacco use 11/21/2015  . Essential hypertension 11/16/2015  . Chest pain 11/16/2015  . Headache 11/16/2015  . Swallowing difficulty 11/16/2015    Past Surgical History:  Procedure Laterality Date  . None       OB History    Gravida  4   Para  1   Term  1   Preterm      AB  3   Living  1     SAB  2   TAB  1   Ectopic      Multiple      Live Births               Home Medications    Prior to Admission medications   Medication Sig Start Date End Date Taking? Authorizing Provider  albuterol (PROVENTIL HFA;VENTOLIN HFA) 108 (90 BASE) MCG/ACT inhaler Inhale 2 puffs into the lungs every 6 (six) hours as needed for wheezing. Reported on 11/14/2015   Yes [provider]  diclofenac (VOLTAREN) 75 MG EC tablet Take 1 tablet (75 mg total) by mouth 2 (two) times daily. 11/08/18   Fransico Meadow, PA-C  methocarbamol (ROBAXIN) 500 MG tablet Take 1 tablet (500 mg total) by  mouth 2 (two) times daily. 11/08/18   Fransico Meadow, PA-C  predniSONE (DELTASONE) 50 MG tablet Take 1 tablet (50 mg total) by mouth daily. 11/08/18   Fransico Meadow, PA-C    Family History Family History  Problem Relation Age of Onset  . Alcoholism Father   . Lung cancer Father   . Cancer Father        lung  . Healthy Mother   . Arthritis Other        Parent, grandparent  . Heart disease Other        Parent, grandparent  . Hypertension Other        Parent, grandparent  . Diabetes Other        Maternal grandmother and grandfather   . Breast cancer Paternal Aunt   . Lung cancer Paternal Aunt   . Cancer Paternal Aunt        lung  . Stroke Paternal Barbaraann Rondo        First cousin as well  . Sickle cell anemia Other        Half-brother  . Healthy Son   . Sickle cell anemia Brother     Social History Social  History   Tobacco Use  . Smoking status: Current Some Day Smoker    Packs/day: 0.50    Years: 18.00    Pack years: 9.00    Types: Cigarettes    Last attempt to quit: 12/31/2015    Years since quitting: 2.8  . Smokeless tobacco: Never Used  Substance Use Topics  . Alcohol use: Yes    Alcohol/week: 0.0 standard drinks    Comment: rarely  . Drug use: Yes    Types: Marijuana    Comment: occasionally     Allergies   Hydromorphone   Review of Systems Review of Systems  Gastrointestinal: Positive for abdominal pain.  Genitourinary: Positive for flank pain.  All other systems reviewed and are negative.    Physical Exam Updated Vital Signs BP 132/88 (BP Location: Right Arm)   Pulse 76   Temp 98.3 F (36.8 C) (Oral)   Resp 18   Ht 5\' 6"  (1.676 m)   Wt 81.6 kg   SpO2 100%   BMI 29.05 kg/m   Physical Exam Vitals signs and nursing note reviewed.  Constitutional:      Appearance: Normal appearance.  HENT:     Head: Normocephalic and atraumatic.     Right Ear: External ear normal.     Nose: Nose normal.     Mouth/Throat:     Mouth: Mucous membranes are  moist.  Eyes:     Pupils: Pupils are equal, round, and reactive to light.  Neck:     Musculoskeletal: Normal range of motion.  Cardiovascular:     Rate and Rhythm: Normal rate and regular rhythm.     Pulses: Normal pulses.  Pulmonary:     Effort: Pulmonary effort is normal.  Abdominal:     General: Abdomen is flat. Bowel sounds are normal.  Musculoskeletal: Normal range of motion.  Skin:    General: Skin is warm.  Neurological:     General: No focal deficit present.     Mental Status: She is alert.  Psychiatric:        Mood and Affect: Mood normal.      ED Treatments / Results  Labs (all labs ordered are listed, but only abnormal results are displayed) Labs Reviewed  URINALYSIS, ROUTINE W REFLEX MICROSCOPIC - Abnormal; Notable for the following components:      Result Value   Color, Urine YELLOW (*)    APPearance CLEAR (*)    Specific Gravity, Urine <1.005 (*)    All other components within normal limits  PREGNANCY, URINE    EKG None  Radiology No results found.  Procedures Procedures (including critical care time)  Medications Ordered in ED Medications - No data to display   Initial Impression / Assessment and Plan / ED Course  I have reviewed the triage vital signs and the nursing notes.  Pertinent labs & imaging results that were available during my care of the patient were reviewed by me and considered in my medical decision making (see chart for details).       Final Clinical Impressions(s) / ED Diagnoses   Final diagnoses:  Acute midline low back pain with sciatica, sciatica laterality unspecified    ED Discharge Orders         Ordered    diclofenac (VOLTAREN) 75 MG EC tablet  2 times daily     11/08/18 1757    methocarbamol (ROBAXIN) 500 MG tablet  2 times daily     11/08/18 1757    predniSONE (  DELTASONE) 50 MG tablet  Daily     11/08/18 1759        An After Visit Summary was printed and given to the patient.    Fransico Meadow,  [place] 11/08/18 1806    Lajean Saver, MD 11/09/18 1606

## 2018-11-08 NOTE — Discharge Instructions (Signed)
Return if any problems.  See your Physician for recheck in 1 week   °

## 2018-11-08 NOTE — ED Notes (Signed)
Pt given urine specimen container and instructed to provide UA when possible. Pt verbalized understanding and stated she would give one soon.

## 2018-11-08 NOTE — ED Notes (Signed)
ED Provider at bedside. 

## 2018-11-08 NOTE — ED Notes (Signed)
Patient verbalizes understanding of discharge instructions. Opportunity for questioning and answers were provided. Armband removed by staff, pt discharged from ED ambulatory w/ husband  

## 2018-11-08 NOTE — ED Triage Notes (Signed)
Pt states she has had left side flank pain for 4-6 days. Pain starting to shoot down her left leg.

## 2019-09-06 ENCOUNTER — Emergency Department (HOSPITAL_COMMUNITY)
Admission: EM | Admit: 2019-09-06 | Discharge: 2019-09-06 | Disposition: A | Discharge status: Home / Self Care | Payer: Self-pay | Attending: Emergency Medicine | Admitting: Emergency Medicine

## 2019-09-06 ENCOUNTER — Other Ambulatory Visit: Payer: Self-pay

## 2019-09-06 ENCOUNTER — Emergency Department (HOSPITAL_COMMUNITY): Payer: Self-pay

## 2019-09-06 ENCOUNTER — Encounter (HOSPITAL_COMMUNITY): Payer: Self-pay | Admitting: *Deleted

## 2019-09-06 DIAGNOSIS — W19XXXA Unspecified fall, initial encounter: Secondary | ICD-10-CM

## 2019-09-06 DIAGNOSIS — G44209 Tension-type headache, unspecified, not intractable: Secondary | ICD-10-CM | POA: Insufficient documentation

## 2019-09-06 DIAGNOSIS — Y9289 Other specified places as the place of occurrence of the external cause: Secondary | ICD-10-CM | POA: Insufficient documentation

## 2019-09-06 DIAGNOSIS — I10 Essential (primary) hypertension: Secondary | ICD-10-CM | POA: Insufficient documentation

## 2019-09-06 DIAGNOSIS — M545 Low back pain, unspecified: Secondary | ICD-10-CM

## 2019-09-06 DIAGNOSIS — F17210 Nicotine dependence, cigarettes, uncomplicated: Secondary | ICD-10-CM | POA: Insufficient documentation

## 2019-09-06 DIAGNOSIS — W108XXA Fall (on) (from) other stairs and steps, initial encounter: Secondary | ICD-10-CM | POA: Insufficient documentation

## 2019-09-06 DIAGNOSIS — R42 Dizziness and giddiness: Secondary | ICD-10-CM | POA: Insufficient documentation

## 2019-09-06 DIAGNOSIS — Y999 Unspecified external cause status: Secondary | ICD-10-CM | POA: Insufficient documentation

## 2019-09-06 DIAGNOSIS — Y9389 Activity, other specified: Secondary | ICD-10-CM | POA: Insufficient documentation

## 2019-09-06 DIAGNOSIS — Z79899 Other long term (current) drug therapy: Secondary | ICD-10-CM | POA: Insufficient documentation

## 2019-09-06 DIAGNOSIS — J449 Chronic obstructive pulmonary disease, unspecified: Secondary | ICD-10-CM | POA: Insufficient documentation

## 2019-09-06 LAB — POC URINE PREG, ED: Preg Test, Ur: NEGATIVE

## 2019-09-06 MED ORDER — ACETAMINOPHEN 500 MG PO TABS: 500.0000 mg | ORAL_TABLET | Freq: Four times a day (QID) | ORAL | 0 refills | Status: DC | PRN

## 2019-09-06 MED ORDER — METHOCARBAMOL 500 MG PO TABS: 500.0000 mg | ORAL_TABLET | Freq: Two times a day (BID) | ORAL | 0 refills | Status: DC

## 2019-09-06 MED ORDER — MELOXICAM 7.5 MG PO TABS: 7.5000 mg | ORAL_TABLET | Freq: Every day | ORAL | 0 refills | Status: DC

## 2019-09-06 MED ORDER — KETOROLAC TROMETHAMINE 30 MG/ML IJ SOLN
15.0000 mg | Freq: Once | INTRAMUSCULAR | Status: AC
  Administered 2019-09-06: 15 mg via INTRAMUSCULAR
  Filled 2019-09-06: qty 1

## 2019-09-06 NOTE — ED Triage Notes (Signed)
Headache for 2 months, states she takes something for pain every day, c/o feeling puffy on the left side of neck, states she fell on her porch yesterday and now has back pain and pain radiating into her buttocks.

## 2019-09-06 NOTE — Discharge Instructions (Signed)
Medications: Robaxin, Mobic, Tylenol  Treatment: Take Robaxin 2 times daily as needed for muscle spasms. Do not drive or operate machinery when taking this medication. Take Mobic once daily as needed for your pain. You can alternate with Tylenol as prescribed as well. For the first 2-3 days, use ice 3-4 times daily alternating 20 minutes on, 20 minutes off. After the first 2-3 days, use moist heat in the same manner.   Follow-up: Please follow-up with your primary care provider or orthopedic doctor if your symptoms persist. See the phone number outlined in black for recommendations for a PCP. Please return to emergency department if you develop any new or worsening symptoms.

## 2019-09-06 NOTE — ED Provider Notes (Signed)
[facility] EMERGENCY DEPARTMENT Provider Note   CSN: [id number] Arrival date & time: 09/06/19  1207     History   Chief Complaint Chief Complaint  Patient presents with   Headache   Fall   Back Pain    HPI Joann White is a 43 y.o. female with history of hypertension, COPD, headaches who presents following fall.  Patient ports she slipped on the steps and hit her back on a step.  She thinks she may have hit her head.  She had a headache and dizziness afterwards as well as nausea but that has mostly resolved.  She does still have a headache.  She reports low back pain radiating to her right buttock.  She also reports some neck and upper back pain.  She denies any numbness or tingling in her legs, saddle anesthesia, loss of bowel or bladder control. Patient reports that she has been having headache wrapping around her head like a band for the past 2 months.  She sits on the computer a lot.  She has had tension in her shoulders.  She also reports feeling puffy on the left side of her lower neck.  She denies any pain, however.     HPI  Past Medical History:  Diagnosis Date   Allergic rhinitis    Chickenpox    Chronic daily headache    with vision changes and muffled hearing   COPD (chronic obstructive pulmonary disease) ([place])    Dizziness    Hypertension     Patient Active Problem List   Diagnosis Date Noted   Menorrhagia 05/04/2017   Uterine fibroids 05/04/2017   Anemia 05/04/2017   Dysuria 12/09/2015   Tobacco use 11/21/2015   Essential hypertension 11/16/2015   Chest pain 11/16/2015   Headache 11/16/2015   Swallowing difficulty 11/16/2015    Past Surgical History:  Procedure Laterality Date   None       OB History    Gravida  4   Para  1   Term  1   Preterm      AB  3   Living  1     SAB  2   TAB  1   Ectopic      Multiple      Live Births               Home Medications    Prior to Admission medications     Medication Sig Start Date End Date Taking? Authorizing Provider  acetaminophen (TYLENOL) 500 MG tablet Take 1 tablet (500 mg total) by mouth every 6 (six) hours as needed. 09/06/19   Nitisha Civello, Bea Graff, PA-C  albuterol (PROVENTIL HFA;VENTOLIN HFA) 108 (90 BASE) MCG/ACT inhaler Inhale 2 puffs into the lungs every 6 (six) hours as needed for wheezing. Reported on 11/14/2015    [provider]  diclofenac (VOLTAREN) 75 MG EC tablet Take 1 tablet (75 mg total) by mouth 2 (two) times daily. 11/08/18   Fransico Meadow, PA-C  meloxicam (MOBIC) 7.5 MG tablet Take 1 tablet (7.5 mg total) by mouth daily. 09/06/19   Maecy Podgurski, Bea Graff, PA-C  methocarbamol (ROBAXIN) 500 MG tablet Take 1 tablet (500 mg total) by mouth 2 (two) times daily. 09/06/19   Jentry Warnell, Bea Graff, PA-C  predniSONE (DELTASONE) 50 MG tablet Take 1 tablet (50 mg total) by mouth daily. 11/08/18   Fransico Meadow, PA-C    Family History Family History  Problem Relation Age of Onset   Alcoholism  Father    Lung cancer Father    Cancer Father        lung   Healthy Mother    Arthritis Other        Parent, grandparent   Heart disease Other        Parent, grandparent   Hypertension Other        Parent, grandparent   Diabetes Other        Maternal grandmother and grandfather    Breast cancer Paternal Aunt    Lung cancer Paternal Aunt    Cancer Paternal Aunt        lung   Stroke Paternal Uncle        First cousin as well   Sickle cell anemia Other        Half-brother   Healthy Son    Sickle cell anemia Brother     Social History Social History   Tobacco Use   Smoking status: Current Some Day Smoker    Packs/day: 0.50    Years: 18.00    Pack years: 9.00    Types: Cigarettes    Last attempt to quit: 12/31/2015    Years since quitting: 3.6   Smokeless tobacco: Never Used  Substance Use Topics   Alcohol use: Yes    Alcohol/week: 0.0 standard drinks    Comment: rarely   Drug use: Yes    Types: Marijuana     Comment: occasionally     Allergies   Hydromorphone   Review of Systems Review of Systems  Constitutional: Negative for chills and fever.  HENT: Negative for facial swelling and sore throat.   Respiratory: Negative for shortness of breath.   Cardiovascular: Negative for chest pain.  Gastrointestinal: Positive for nausea. Negative for abdominal pain and vomiting.  Genitourinary: Negative for dysuria.  Musculoskeletal: Positive for back pain and neck pain.  Skin: Negative for rash and wound.  Neurological: Positive for dizziness and headaches.  Psychiatric/Behavioral: The patient is not nervous/anxious.      Physical Exam Updated Vital Signs BP (!) 146/94 (BP Location: Right Arm)    Pulse 82    Temp 98.7 F (37.1 C) (Oral)    Resp 16    Ht 5\' 6"  (1.676 m)    Wt 75 kg    LMP 09/03/2019 Comment: negative u-preg 09/06/19   SpO2 100%    BMI 26.69 kg/m   Physical Exam Vitals signs and nursing note reviewed.  Constitutional:      General: She is not in acute distress.    Appearance: She is well-developed. She is not diaphoretic.  HENT:     Head: Normocephalic and atraumatic.     Mouth/Throat:     Pharynx: No oropharyngeal exudate.  Eyes:     General: No scleral icterus.       Right eye: No discharge.        Left eye: No discharge.     Conjunctiva/sclera: Conjunctivae normal.     Pupils: Pupils are equal, round, and reactive to light.  Neck:     Musculoskeletal: Normal range of motion and neck supple.     Thyroid: Thyromegaly (mild) present. No thyroid tenderness.   Cardiovascular:     Rate and Rhythm: Normal rate and regular rhythm.     Heart sounds: Normal heart sounds. No murmur. No friction rub. No gallop.   Pulmonary:     Effort: Pulmonary effort is normal. No respiratory distress.     Breath sounds: Normal breath sounds. No  stridor. No wheezing or rales.  Abdominal:     General: Bowel sounds are normal. There is no distension.     Palpations: Abdomen is soft.      Tenderness: There is no abdominal tenderness. There is no guarding or rebound.  Musculoskeletal:       Back:  Lymphadenopathy:     Cervical: No cervical adenopathy.  Skin:    General: Skin is warm and dry.     Coloration: Skin is not pale.     Findings: No rash.  Neurological:     Mental Status: She is alert.     Coordination: Coordination normal.     Comments: CN 3-12 intact; normal sensation throughout; 5/5 strength in all 4 extremities; equal bilateral grip strength      ED Treatments / Results  Labs (all labs ordered are listed, but only abnormal results are displayed) Labs Reviewed  POC URINE PREG, ED    EKG None  Radiology Dg Thoracic Spine W/swimmers  Result Date: 09/06/2019 CLINICAL DATA:  Status post fall with back pain. EXAM: THORACIC SPINE - 3 VIEWS COMPARISON:  None. FINDINGS: There is no evidence of thoracic spine fracture. Alignment is normal. No other significant bone abnormalities are identified. IMPRESSION: Negative. Electronically Signed   By: Abelardo Diesel M.D.   On: 09/06/2019 15:05   Dg Lumbar Spine Complete  Result Date: 09/06/2019 CLINICAL DATA:  Status post fall with back pain EXAM: LUMBAR SPINE - COMPLETE 4+ VIEW COMPARISON:  Mar 12, 2011 FINDINGS: There is no evidence of lumbar spine fracture. Alignment is normal. Intervertebral disc spaces are maintained. IMPRESSION: Negative. Electronically Signed   By: Abelardo Diesel M.D.   On: 09/06/2019 15:05   Dg Sacrum/coccyx  Result Date: 09/06/2019 CLINICAL DATA:  Status post fall with buttock pain. EXAM: SACRUM AND COCCYX - 2+ VIEW COMPARISON:  None. FINDINGS: There is no evidence of fracture or other focal bone lesions. IMPRESSION: Negative. Electronically Signed   By: Abelardo Diesel M.D.   On: 09/06/2019 15:06   Ct Head Wo Contrast  Result Date: 09/06/2019 CLINICAL DATA:  Left-sided headache and dizziness for the past 2-3 months. Fell yesterday. EXAM: CT HEAD WITHOUT CONTRAST CT CERVICAL SPINE  WITHOUT CONTRAST TECHNIQUE: Multidetector CT imaging of the head and cervical spine was performed following the standard protocol without intravenous contrast. Multiplanar CT image reconstructions of the cervical spine were also generated. COMPARISON:  Head CT dated 03/28/2017. Brain MR dated 11/18/2017. FINDINGS: CT HEAD FINDINGS Brain: Normal appearing cerebral hemispheres and posterior fossa structures. Normal size and position of the ventricles. No intracranial hemorrhage, mass lesion or CT evidence of acute infarction. Vascular: No hyperdense vessel or unexpected calcification. Skull: Normal. Negative for fracture or focal lesion. Sinuses/Orbits: Marked left maxillary sinus mucosal thickening. Unremarkable orbits. Other: None. CT CERVICAL SPINE FINDINGS Alignment: Minimal reversal of the normal cervical lordosis. Skull base and vertebrae: No acute fracture. No primary bone lesion or focal pathologic process. Soft tissues and spinal canal: No prevertebral fluid or swelling. No visible canal hematoma. Disc levels: Mild to moderate anterior spur formation at the C5-6 level. Otherwise, unremarkable. Upper chest: Clear lung apices Other: None. IMPRESSION: 1. No skull fracture or intracranial hemorrhage. 2. No cervical spine fracture or subluxation. 3. Minimal reversal of the normal cervical lordosis and mild to moderate degenerative changes at the C5-6 level. 4. Marked chronic left maxillary sinusitis. Electronically Signed   By: Claudie Revering M.D.   On: 09/06/2019 14:50   Ct Cervical Spine Wo Contrast  Result Date: 09/06/2019 CLINICAL DATA:  Left-sided headache and dizziness for the past 2-3 months. Fell yesterday. EXAM: CT HEAD WITHOUT CONTRAST CT CERVICAL SPINE WITHOUT CONTRAST TECHNIQUE: Multidetector CT imaging of the head and cervical spine was performed following the standard protocol without intravenous contrast. Multiplanar CT image reconstructions of the cervical spine were also generated. COMPARISON:   Head CT dated 03/28/2017. Brain MR dated 11/18/2017. FINDINGS: CT HEAD FINDINGS Brain: Normal appearing cerebral hemispheres and posterior fossa structures. Normal size and position of the ventricles. No intracranial hemorrhage, mass lesion or CT evidence of acute infarction. Vascular: No hyperdense vessel or unexpected calcification. Skull: Normal. Negative for fracture or focal lesion. Sinuses/Orbits: Marked left maxillary sinus mucosal thickening. Unremarkable orbits. Other: None. CT CERVICAL SPINE FINDINGS Alignment: Minimal reversal of the normal cervical lordosis. Skull base and vertebrae: No acute fracture. No primary bone lesion or focal pathologic process. Soft tissues and spinal canal: No prevertebral fluid or swelling. No visible canal hematoma. Disc levels: Mild to moderate anterior spur formation at the C5-6 level. Otherwise, unremarkable. Upper chest: Clear lung apices Other: None. IMPRESSION: 1. No skull fracture or intracranial hemorrhage. 2. No cervical spine fracture or subluxation. 3. Minimal reversal of the normal cervical lordosis and mild to moderate degenerative changes at the C5-6 level. 4. Marked chronic left maxillary sinusitis. Electronically Signed   By: Claudie Revering M.D.   On: 09/06/2019 14:50   Dg Hips Bilat W Or Wo Pelvis 3-4 Views  Result Date: 09/06/2019 CLINICAL DATA:  Status post fall with hip pain. EXAM: DG HIP (WITH OR WITHOUT PELVIS) 3-4V BILAT COMPARISON:  None. FINDINGS: There is no evidence of hip fracture or dislocation. There is no evidence of arthropathy or other focal bone abnormality. IMPRESSION: Negative. Electronically Signed   By: Abelardo Diesel M.D.   On: 09/06/2019 15:06    Procedures Procedures (including critical care time)  Medications Ordered in ED Medications  ketorolac (TORADOL) 30 MG/ML injection 15 mg (15 mg Intramuscular Given 09/06/19 1413)     Initial Impression / Assessment and Plan / ED Course  I have reviewed the triage vital signs and  the nursing notes.  Pertinent labs & imaging results that were available during my care of the patient were reviewed by me and considered in my medical decision making (see chart for details).        Patient presenting following fall with back pain rating to her right leg.  There are no neurological symptoms.  Normal neuro exam without focal deficits.  Sensation and strength intact.  X-rays are negative.  CT head and C-spine are also negative.  Patient is feeling much better after IM Toradol.  Regarding patient's reported puffiness at the base of her neck, this seems symmetrical to the right side and there is no tender or palpable lymphadenopathy.  Will refer to PCP for further work-up of this continues.  Patient also may have some mild thyromegaly, also have PCP follow-up.  Patient also with probable tension headache.  Stretching and good posture discussed.  Will treat back pain from injury with ice, heat, Mobic, Robaxin, Tylenol.  Follow-up with PCP or orthopedics if symptoms continue.  Patient understands and agrees with plan.  Patient vitals stable throughout ED course and discharged in satisfactory condition.  Final Clinical Impressions(s) / ED Diagnoses   Final diagnoses:  Fall  Acute right-sided low back pain without sciatica  Tension headache    ED Discharge Orders         Ordered  meloxicam (MOBIC) 7.5 MG tablet  Daily     09/06/19 1545    methocarbamol (ROBAXIN) 500 MG tablet  2 times daily     09/06/19 1545    acetaminophen (TYLENOL) 500 MG tablet  Every 6 hours PRN     09/06/19 1545           Eliora Nienhuis, [place], PA-C 09/06/19 1558    Daleen Bo, MD 09/06/19 1800

## 2020-04-08 ENCOUNTER — Emergency Department (HOSPITAL_COMMUNITY): Payer: Medicaid Other

## 2020-04-08 ENCOUNTER — Encounter (HOSPITAL_COMMUNITY): Payer: Self-pay | Admitting: Emergency Medicine

## 2020-04-08 ENCOUNTER — Other Ambulatory Visit: Payer: Self-pay

## 2020-04-08 ENCOUNTER — Emergency Department (HOSPITAL_COMMUNITY)
Admission: EM | Admit: 2020-04-08 | Discharge: 2020-04-09 | Disposition: A | Discharge status: Home / Self Care | Payer: Medicaid Other | Attending: Emergency Medicine | Admitting: Emergency Medicine

## 2020-04-08 DIAGNOSIS — I10 Essential (primary) hypertension: Secondary | ICD-10-CM | POA: Insufficient documentation

## 2020-04-08 DIAGNOSIS — Z79899 Other long term (current) drug therapy: Secondary | ICD-10-CM | POA: Insufficient documentation

## 2020-04-08 DIAGNOSIS — F17210 Nicotine dependence, cigarettes, uncomplicated: Secondary | ICD-10-CM | POA: Insufficient documentation

## 2020-04-08 DIAGNOSIS — D649 Anemia, unspecified: Secondary | ICD-10-CM | POA: Insufficient documentation

## 2020-04-08 DIAGNOSIS — J449 Chronic obstructive pulmonary disease, unspecified: Secondary | ICD-10-CM | POA: Insufficient documentation

## 2020-04-08 DIAGNOSIS — N920 Excessive and frequent menstruation with regular cycle: Secondary | ICD-10-CM | POA: Insufficient documentation

## 2020-04-08 LAB — BASIC METABOLIC PANEL
Anion gap: 9 (ref 5–15)
BUN: 6 mg/dL (ref 6–20)
CO2: 24 mmol/L (ref 22–32)
Calcium: 8.8 mg/dL — ABNORMAL LOW (ref 8.9–10.3)
Chloride: 102 mmol/L (ref 98–111)
Creatinine, Ser: 0.64 mg/dL (ref 0.44–1.00)
GFR calc Af Amer: >60 mL/min (ref >60)
GFR calc non Af Amer: >60 mL/min (ref >60)
Glucose, Bld: 88 mg/dL (ref 70–99)
Potassium: 3.1 mmol/L — ABNORMAL LOW (ref 3.5–5.1)
Sodium: 135 mmol/L (ref 135–145)

## 2020-04-08 LAB — CBC
HCT: 22.8 % — ABNORMAL LOW (ref 36.0–46.0)
Hemoglobin: 6.4 g/dL — CL (ref 12.0–15.0)
MCH: 18.3 pg — ABNORMAL LOW (ref 26.0–34.0)
MCHC: 28.1 g/dL — ABNORMAL LOW (ref 30.0–36.0)
MCV: 65.3 fL — ABNORMAL LOW (ref 80.0–100.0)
Platelets: 442 10*3/uL — ABNORMAL HIGH (ref 150–400)
RBC: 3.49 MIL/uL — ABNORMAL LOW (ref 3.87–5.11)
RDW: 18.9 % — ABNORMAL HIGH (ref 11.5–15.5)
WBC: 12.1 10*3/uL — ABNORMAL HIGH (ref 4.0–10.5)
nRBC: 0 % (ref 0.0–0.2)

## 2020-04-08 LAB — POC URINE PREG, ED: Preg Test, Ur: NEGATIVE

## 2020-04-08 LAB — POC OCCULT BLOOD, ED: Fecal Occult Bld: NEGATIVE

## 2020-04-08 LAB — PREPARE RBC (CROSSMATCH)

## 2020-04-08 MED ORDER — SODIUM CHLORIDE 0.9 % IV SOLN
10.0000 mL/h | Freq: Once | INTRAVENOUS | Status: AC
  Administered 2020-04-09: 10 mL/h via INTRAVENOUS

## 2020-04-08 NOTE — ED Provider Notes (Signed)
[facility] EMERGENCY DEPARTMENT Provider Note   CSN: [id number] Arrival date & time: 04/08/20  2105     History Chief Complaint  Patient presents with  . Headache    Joann White is a 44 y.o. female.  Patient presents to the emergency department for evaluation of headache.  Patient reports that her headache has been ongoing for 2 or 3 weeks.  She reports significant worsening of her headache when she coughs, sneezes, bends over.  Over this period of time she has had a lot of nasal congestion, but has frequent problems with her sinuses, did not think anything of it.  No fever.  No neck pain or stiffness. She has noticed generalized fatigue. She reports that she has frequently been able to hear her pulse in her temple.        Past Medical History:  Diagnosis Date  . Allergic rhinitis   . Chickenpox   . Chronic daily headache    with vision changes and muffled hearing  . COPD (chronic obstructive pulmonary disease) ([place])   . Dizziness   . Hypertension     Patient Active Problem List   Diagnosis Date Noted  . Menorrhagia 05/04/2017  . Uterine fibroids 05/04/2017  . Anemia 05/04/2017  . Dysuria 12/09/2015  . Tobacco use 11/21/2015  . Essential hypertension 11/16/2015  . Chest pain 11/16/2015  . Headache 11/16/2015  . Swallowing difficulty 11/16/2015    Past Surgical History:  Procedure Laterality Date  . None       OB History    Gravida  4   Para  1   Term  1   Preterm      AB  3   Living  1     SAB  2   TAB  1   Ectopic      Multiple      Live Births              Family History  Problem Relation Age of Onset  . Alcoholism Father   . Lung cancer Father   . Cancer Father        lung  . Healthy Mother   . Arthritis Other        Parent, grandparent  . Heart disease Other        Parent, grandparent  . Hypertension Other        Parent, grandparent  . Diabetes Other        Maternal grandmother and grandfather   . Breast cancer  Paternal Aunt   . Lung cancer Paternal Aunt   . Cancer Paternal Aunt        lung  . Stroke Paternal Barbaraann Rondo        First cousin as well  . Sickle cell anemia Other        Half-brother  . Healthy Son   . Sickle cell anemia Brother     Social History   Tobacco Use  . Smoking status: Current Some Day Smoker    Packs/day: 1.00    Years: 18.00    Pack years: 18.00    Types: Cigarettes    Last attempt to quit: 12/31/2015    Years since quitting: 4.2  . Smokeless tobacco: Never Used  Substance Use Topics  . Alcohol use: Yes    Alcohol/week: 0.0 standard drinks    Comment: rarely  . Drug use: Yes    Types: Marijuana    Comment: occasionally    Home Medications Prior  to Admission medications   Medication Sig Start Date End Date Taking? Authorizing Provider  acetaminophen (TYLENOL) 500 MG tablet Take 1 tablet (500 mg total) by mouth every 6 (six) hours as needed. 09/06/19   Law, Bea Graff, PA-C  albuterol (PROVENTIL HFA;VENTOLIN HFA) 108 (90 BASE) MCG/ACT inhaler Inhale 2 puffs into the lungs every 6 (six) hours as needed for wheezing. Reported on 11/14/2015    [provider]  diclofenac (VOLTAREN) 75 MG EC tablet Take 1 tablet (75 mg total) by mouth 2 (two) times daily. 11/08/18   Fransico Meadow, PA-C  meloxicam (MOBIC) 7.5 MG tablet Take 1 tablet (7.5 mg total) by mouth daily. 09/06/19   Law, Bea Graff, PA-C  methocarbamol (ROBAXIN) 500 MG tablet Take 1 tablet (500 mg total) by mouth 2 (two) times daily. 09/06/19   Law, Bea Graff, PA-C  predniSONE (DELTASONE) 50 MG tablet Take 1 tablet (50 mg total) by mouth daily. 11/08/18   Fransico Meadow, PA-C    Allergies    Hydromorphone  Review of Systems   Review of Systems  HENT: Positive for congestion, sinus pressure and sinus pain.   Neurological: Positive for headaches.  All other systems reviewed and are negative.   Physical Exam Updated Vital Signs BP (!) 155/103 (BP Location: Right Arm)   Pulse 90   Temp 98.7  F (37.1 C) (Oral)   Resp 20   Ht 5\' 6"  (1.676 m)   Wt 83.9 kg   LMP 03/29/2020   SpO2 100%   BMI 29.86 kg/m   Physical Exam Vitals and nursing note reviewed. Exam conducted with a chaperone present.  Constitutional:      General: She is not in acute distress.    Appearance: Normal appearance. She is well-developed.  HENT:     Head: Normocephalic and atraumatic.     Right Ear: Hearing, tympanic membrane, ear canal and external ear normal.     Left Ear: Hearing, tympanic membrane, ear canal and external ear normal.     Nose:     Right Sinus: Maxillary sinus tenderness present.     Left Sinus: Maxillary sinus tenderness present.  Eyes:     Conjunctiva/sclera: Conjunctivae normal.     Pupils: Pupils are equal, round, and reactive to light.  Cardiovascular:     Rate and Rhythm: Regular rhythm.     Heart sounds: S1 normal and S2 normal. No murmur. No friction rub. No gallop.   Pulmonary:     Effort: Pulmonary effort is normal. No respiratory distress.     Breath sounds: Normal breath sounds.  Chest:     Chest wall: No tenderness.  Abdominal:     General: Bowel sounds are normal.     Palpations: Abdomen is soft.     Tenderness: There is no abdominal tenderness. There is no guarding or rebound. Negative signs include Murphy's sign and McBurney's sign.     Hernia: No hernia is present.  Genitourinary:    Rectum: Normal. Guaiac result negative.  Musculoskeletal:        General: Normal range of motion.     Cervical back: Normal range of motion and neck supple.  Skin:    General: Skin is warm and dry.     Findings: No rash.  Neurological:     Mental Status: She is alert and oriented to person, place, and time.     GCS: GCS eye subscore is 4. GCS verbal subscore is 5. GCS motor subscore is 6.  Cranial Nerves: No cranial nerve deficit.     Sensory: No sensory deficit.     Coordination: Coordination normal.  Psychiatric:        Speech: Speech normal.        Behavior:  Behavior normal.        Thought Content: Thought content normal.     ED Results / Procedures / Treatments   Labs (all labs ordered are listed, but only abnormal results are displayed) Labs Reviewed  BASIC METABOLIC PANEL - Abnormal; Notable for the following components:      Result Value   Potassium 3.1 (*)    Calcium 8.8 (*)    All other components within normal limits  CBC - Abnormal; Notable for the following components:   WBC 12.1 (*)    RBC 3.49 (*)    Hemoglobin 6.4 (*)    HCT 22.8 (*)    MCV 65.3 (*)    MCH 18.3 (*)    MCHC 28.1 (*)    RDW 18.9 (*)    Platelets 442 (*)    All other components within normal limits  POC URINE PREG, ED  POC OCCULT BLOOD, ED  TYPE AND SCREEN  PREPARE RBC (CROSSMATCH)    EKG None  Radiology CT HEAD WO CONTRAST  Result Date: 04/08/2020 CLINICAL DATA:  Headache EXAM: CT HEAD WITHOUT CONTRAST TECHNIQUE: Contiguous axial images were obtained from the base of the skull through the vertex without intravenous contrast. COMPARISON:  09/06/2019 FINDINGS: Brain: No acute intracranial abnormality. Specifically, no hemorrhage, hydrocephalus, mass lesion, acute infarction, or significant intracranial injury. Vascular: No hyperdense vessel or unexpected calcification. Skull: Color No acute calvarial abnormality. Sinuses/Orbits: Visualized paranasal sinuses and mastoids clear. Orbital soft tissues unremarkable. Other: None IMPRESSION: Normal study. Electronically Signed   By: Rolm Baptise M.D.   On: 04/08/2020 23:35   DG Chest Portable 1 View  Result Date: 04/08/2020 CLINICAL DATA:  Cough for 2 weeks EXAM: PORTABLE CHEST 1 VIEW COMPARISON:  None. FINDINGS: The heart size and mediastinal contours are within normal limits. Both lungs are clear. The visualized skeletal structures are unremarkable. IMPRESSION: No active disease. Electronically Signed   By: Inez Catalina M.D.   On: 04/08/2020 23:03    Procedures Procedures (including critical care  time)  Medications Ordered in ED Medications  0.9 %  sodium chloride infusion (has no administration in time range)    ED Course  I have reviewed the triage vital signs and the nursing notes.  Pertinent labs & imaging results that were available during my care of the patient were reviewed by me and considered in my medical decision making (see chart for details).    MDM Rules/Calculators/A&P                      Patient presents with complaints of generalized weakness, cough, pounding headache. Patient reports that the headache is positional, worsens when she bends over, coughs or sneezes. She has felt pressure around the ears and has felt like she can hear her heartbeat in her ears. Neurologic examination is normal, no focal abnormalities noted. CT head unremarkable. Patient reports that her cough is only occasional and nonproductive. Chest x-ray does not show any active disease. Basic labs were obtained and she is found to be very anemic. This is likely the cause of her generalized weakness. Pulsatile tinnitus is also likely secondary to increased heart rate with changes in positions because of her anemia. She has not noticed any rectal  bleeding or hematuria. Rectal exam was normal, guaiac negative. Patient does report that she has a somewhat heavy menstrual cycle. This is likely the cause of her symptomatic anemia. She will require blood transfusion and outpatient follow-up with OB/GYN.  Final Clinical Impression(s) / ED Diagnoses Final diagnoses:  Menorrhagia with regular cycle  Symptomatic anemia    Rx / DC Orders ED Discharge Orders    None       Orpah Greek, MD 04/08/20 2344

## 2020-04-08 NOTE — ED Triage Notes (Signed)
Pt states shes had a headache and cough for about 2-3 weeks.

## 2020-04-09 DIAGNOSIS — Z9289 Personal history of other medical treatment: Secondary | ICD-10-CM

## 2020-04-09 HISTORY — DX: Personal history of other medical treatment: Z92.89

## 2020-04-09 LAB — ABO/RH: ABO/RH(D): A POS

## 2020-04-10 LAB — TYPE AND SCREEN
ABO/RH(D): A POS
Antibody Screen: NEGATIVE
Unit division: 0
Unit division: 0

## 2020-04-10 LAB — BPAM RBC
Blood Product Expiration Date: 202106302359
Blood Product Expiration Date: 202106302359
ISSUE DATE / TIME: 202106020052
ISSUE DATE / TIME: 202106020234
Unit Type and Rh: 600
Unit Type and Rh: 600

## 2020-11-08 DIAGNOSIS — F32A Depression, unspecified: Secondary | ICD-10-CM

## 2020-11-08 DIAGNOSIS — F419 Anxiety disorder, unspecified: Secondary | ICD-10-CM

## 2020-11-08 DIAGNOSIS — N920 Excessive and frequent menstruation with regular cycle: Secondary | ICD-10-CM

## 2020-11-08 HISTORY — DX: Depression, unspecified: F32.A

## 2020-11-08 HISTORY — DX: Excessive and frequent menstruation with regular cycle: N92.0

## 2020-11-08 HISTORY — DX: Anxiety disorder, unspecified: F41.9

## 2021-01-09 ENCOUNTER — Emergency Department (HOSPITAL_COMMUNITY): Payer: Self-pay

## 2021-01-09 ENCOUNTER — Encounter (HOSPITAL_COMMUNITY): Payer: Self-pay | Admitting: *Deleted

## 2021-01-09 ENCOUNTER — Emergency Department (HOSPITAL_COMMUNITY)
Admission: EM | Admit: 2021-01-09 | Discharge: 2021-01-09 | Disposition: A | Discharge status: Home / Self Care | Payer: Self-pay | Attending: Emergency Medicine | Admitting: Emergency Medicine

## 2021-01-09 ENCOUNTER — Other Ambulatory Visit: Payer: Self-pay

## 2021-01-09 DIAGNOSIS — J449 Chronic obstructive pulmonary disease, unspecified: Secondary | ICD-10-CM | POA: Insufficient documentation

## 2021-01-09 DIAGNOSIS — F17210 Nicotine dependence, cigarettes, uncomplicated: Secondary | ICD-10-CM | POA: Insufficient documentation

## 2021-01-09 DIAGNOSIS — R42 Dizziness and giddiness: Secondary | ICD-10-CM | POA: Insufficient documentation

## 2021-01-09 DIAGNOSIS — I10 Essential (primary) hypertension: Secondary | ICD-10-CM | POA: Insufficient documentation

## 2021-01-09 LAB — CBC WITH DIFFERENTIAL/PLATELET
Abs Immature Granulocytes: 0.03 10*3/uL (ref 0.00–0.07)
Basophils Absolute: 0 10*3/uL (ref 0.0–0.1)
Basophils Relative: 0 %
Eosinophils Absolute: 0.1 10*3/uL (ref 0.0–0.5)
Eosinophils Relative: 2 %
HCT: 26.9 % — ABNORMAL LOW (ref 36.0–46.0)
Hemoglobin: 8.2 g/dL — ABNORMAL LOW (ref 12.0–15.0)
Immature Granulocytes: 0 %
Lymphocytes Relative: 23 %
Lymphs Abs: 2 10*3/uL (ref 0.7–4.0)
MCH: 21.8 pg — ABNORMAL LOW (ref 26.0–34.0)
MCHC: 30.5 g/dL (ref 30.0–36.0)
MCV: 71.5 fL — ABNORMAL LOW (ref 80.0–100.0)
Monocytes Absolute: 0.6 10*3/uL (ref 0.1–1.0)
Monocytes Relative: 7 %
Neutro Abs: 5.9 10*3/uL (ref 1.7–7.7)
Neutrophils Relative %: 68 %
Platelets: 508 10*3/uL — ABNORMAL HIGH (ref 150–400)
RBC: 3.76 MIL/uL — ABNORMAL LOW (ref 3.87–5.11)
RDW: 17.2 % — ABNORMAL HIGH (ref 11.5–15.5)
WBC: 8.7 10*3/uL (ref 4.0–10.5)
nRBC: 0 % (ref 0.0–0.2)

## 2021-01-09 LAB — HCG, QUANTITATIVE, PREGNANCY: hCG, Beta Chain, Quant, S: 1 m[IU]/mL (ref <5)

## 2021-01-09 LAB — BASIC METABOLIC PANEL
Anion gap: 10 (ref 5–15)
BUN: <5 mg/dL — ABNORMAL LOW (ref 6–20)
CO2: 25 mmol/L (ref 22–32)
Calcium: 9.1 mg/dL (ref 8.9–10.3)
Chloride: 106 mmol/L (ref 98–111)
Creatinine, Ser: 0.65 mg/dL (ref 0.44–1.00)
GFR, Estimated: >60 mL/min (ref >60)
Glucose, Bld: 96 mg/dL (ref 70–99)
Potassium: 3.3 mmol/L — ABNORMAL LOW (ref 3.5–5.1)
Sodium: 141 mmol/L (ref 135–145)

## 2021-01-09 MED ORDER — OXYCODONE-ACETAMINOPHEN 5-325 MG PO TABS
1.0000 | ORAL_TABLET | Freq: Once | ORAL | Status: AC
  Administered 2021-01-09: 1 via ORAL
  Filled 2021-01-09: qty 1

## 2021-01-09 MED ORDER — AMLODIPINE BESYLATE 5 MG PO TABS: 5.0000 mg | ORAL_TABLET | Freq: Every day | ORAL | 1 refills | Status: DC

## 2021-01-09 NOTE — ED Triage Notes (Signed)
Head pain for 2 weeks, worse with movement

## 2021-01-09 NOTE — ED Provider Notes (Signed)
[facility] EMERGENCY DEPARTMENT Provider Note   CSN: [id number] Arrival date & time: 01/09/21  1334     History Chief Complaint  Patient presents with  . Headache    Joann White is a 45 y.o. female.  Patient complains of a headache some dizziness.  She has been off her blood pressure medicines for a while.  She also has a history of anemia from heavy menses  The history is provided by the patient and medical records. No language interpreter was used.  Headache Pain location:  Frontal Quality:  Dull Severity currently:  4/10 Severity at highest:  7/10 Timing:  Constant Chronicity:  New Context: not activity   Relieved by:  Nothing Associated symptoms: no abdominal pain, no back pain, no congestion, no cough, no diarrhea, no fatigue, no seizures and no sinus pressure        Past Medical History:  Diagnosis Date  . Allergic rhinitis   . Chickenpox   . Chronic daily headache    with vision changes and muffled hearing  . COPD (chronic obstructive pulmonary disease) ([place])   . Dizziness   . Hypertension     Patient Active Problem List   Diagnosis Date Noted  . Menorrhagia 05/04/2017  . Uterine fibroids 05/04/2017  . Anemia 05/04/2017  . Dysuria 12/09/2015  . Tobacco use 11/21/2015  . Essential hypertension 11/16/2015  . Chest pain 11/16/2015  . Headache 11/16/2015  . Swallowing difficulty 11/16/2015    Past Surgical History:  Procedure Laterality Date  . None       OB History    Gravida  4   Para  1   Term  1   Preterm      AB  3   Living  1     SAB  2   IAB  1   Ectopic      Multiple      Live Births              Family History  Problem Relation Age of Onset  . Alcoholism Father   . Lung cancer Father   . Cancer Father        lung  . Healthy Mother   . Arthritis Other        Parent, grandparent  . Heart disease Other        Parent, grandparent  . Hypertension Other        Parent, grandparent  . Diabetes Other         Maternal grandmother and grandfather   . Breast cancer Paternal Aunt   . Lung cancer Paternal Aunt   . Cancer Paternal Aunt        lung  . Stroke Paternal Barbaraann Rondo        First cousin as well  . Sickle cell anemia Other        Half-brother  . Healthy Son   . Sickle cell anemia Brother     Social History   Tobacco Use  . Smoking status: Current Some Day Smoker    Packs/day: 1.00    Years: 18.00    Pack years: 18.00    Types: Cigarettes    Last attempt to quit: 12/31/2015    Years since quitting: 5.0  . Smokeless tobacco: Never Used  Vaping Use  . Vaping Use: Never used  Substance Use Topics  . Alcohol use: Yes    Alcohol/week: 0.0 standard drinks    Comment: rarely  . Drug use: Yes  Types: Marijuana    Comment: occasionally    Home Medications Prior to Admission medications   Medication Sig Start Date End Date Taking? Authorizing Provider  amLODipine (NORVASC) 5 MG tablet Take 1 tablet (5 mg total) by mouth daily. 01/09/21  Yes Milton Ferguson, MD  acetaminophen (TYLENOL) 500 MG tablet Take 1 tablet (500 mg total) by mouth every 6 (six) hours as needed. 09/06/19   Law, Bea Graff, PA-C  albuterol (PROVENTIL HFA;VENTOLIN HFA) 108 (90 BASE) MCG/ACT inhaler Inhale 2 puffs into the lungs every 6 (six) hours as needed for wheezing. Reported on 11/14/2015    [provider]  diclofenac (VOLTAREN) 75 MG EC tablet Take 1 tablet (75 mg total) by mouth 2 (two) times daily. 11/08/18   Fransico Meadow, PA-C  meloxicam (MOBIC) 7.5 MG tablet Take 1 tablet (7.5 mg total) by mouth daily. 09/06/19   Law, Bea Graff, PA-C  methocarbamol (ROBAXIN) 500 MG tablet Take 1 tablet (500 mg total) by mouth 2 (two) times daily. 09/06/19   Law, Bea Graff, PA-C  predniSONE (DELTASONE) 50 MG tablet Take 1 tablet (50 mg total) by mouth daily. 11/08/18   Fransico Meadow, PA-C    Allergies    Hydromorphone  Review of Systems   Review of Systems  Constitutional: Negative for appetite change and  fatigue.  HENT: Negative for congestion, ear discharge and sinus pressure.   Eyes: Negative for discharge.  Respiratory: Negative for cough.   Cardiovascular: Negative for chest pain.  Gastrointestinal: Negative for abdominal pain and diarrhea.  Genitourinary: Negative for frequency and hematuria.  Musculoskeletal: Negative for back pain.  Skin: Negative for rash.  Neurological: Positive for headaches. Negative for seizures.  Psychiatric/Behavioral: Negative for hallucinations.    Physical Exam Updated Vital Signs BP (!) 156/114   Pulse 85   Temp 98.3 F (36.8 C) (Oral)   Resp 18   Ht 5\' 6"  (1.676 m)   Wt 77.1 kg   LMP 01/08/2021   SpO2 99%   BMI 27.44 kg/m   Physical Exam Vitals and nursing note reviewed.  Constitutional:      Appearance: She is well-developed.  HENT:     Head: Normocephalic.     Nose: Nose normal.  Eyes:     General: No scleral icterus.    Extraocular Movements: EOM normal.     Conjunctiva/sclera: Conjunctivae normal.  Neck:     Thyroid: No thyromegaly.  Cardiovascular:     Rate and Rhythm: Normal rate and regular rhythm.     Heart sounds: No murmur heard. No friction rub. No gallop.   Pulmonary:     Breath sounds: No stridor. No wheezing or rales.  Chest:     Chest wall: No tenderness.  Abdominal:     General: There is no distension.     Tenderness: There is no abdominal tenderness. There is no rebound.  Musculoskeletal:        General: No edema. Normal range of motion.     Cervical back: Neck supple.  Lymphadenopathy:     Cervical: No cervical adenopathy.  Skin:    Findings: No erythema or rash.  Neurological:     Mental Status: She is alert and oriented to person, place, and time.     Motor: No abnormal muscle tone.     Coordination: Coordination normal.  Psychiatric:        Mood and Affect: Mood and affect normal.        Behavior: Behavior normal.  ED Results / Procedures / Treatments   Labs (all labs ordered are listed,  but only abnormal results are displayed) Labs Reviewed  CBC WITH DIFFERENTIAL/PLATELET - Abnormal; Notable for the following components:      Result Value   RBC 3.76 (*)    Hemoglobin 8.2 (*)    HCT 26.9 (*)    MCV 71.5 (*)    MCH 21.8 (*)    RDW 17.2 (*)    Platelets 508 (*)    All other components within normal limits  BASIC METABOLIC PANEL - Abnormal; Notable for the following components:   Potassium 3.3 (*)    BUN <5 (*)    All other components within normal limits  HCG, QUANTITATIVE, PREGNANCY    EKG None  Radiology CT Head Wo Contrast  Result Date: 01/09/2021 CLINICAL DATA:  Head pain for 2 weeks, worse with movement, hypertension EXAM: CT HEAD WITHOUT CONTRAST TECHNIQUE: Contiguous axial images were obtained from the base of the skull through the vertex without intravenous contrast. COMPARISON:  04/08/2020 FINDINGS: Brain: No acute infarct or hemorrhage. Lateral ventricles and midline structures are unremarkable. No acute extra-axial fluid collections. No mass effect. Vascular: No hyperdense vessel or unexpected calcification. Skull: Normal. Negative for fracture or focal lesion. Sinuses/Orbits: No acute finding. Other: None. IMPRESSION: 1. Stable head CT, no acute intracranial process. Electronically Signed   By: Randa Ngo M.D.   On: 01/09/2021 16:11    Procedures Procedures   Medications Ordered in ED Medications  oxyCODONE-acetaminophen (PERCOCET/ROXICET) 5-325 MG per tablet 1 tablet (1 tablet Oral Given 01/09/21 1618)    ED Course  I have reviewed the triage vital signs and the nursing notes.  Pertinent labs & imaging results that were available during my care of the patient were reviewed by me and considered in my medical decision making (see chart for details).    MDM Rules/Calculators/A&P                          Patient with headaches and hypertension.  She also has anemia that has been chronic from vaginal bleeding.  Patient will be placed on Norvasc for  hypertension and referred to neurology for headaches` Final Clinical Impression(s) / ED Diagnoses Final diagnoses:  Primary hypertension    Rx / DC Orders ED Discharge Orders         Ordered    amLODipine (NORVASC) 5 MG tablet  Daily        01/09/21 1651           Milton Ferguson, MD 01/09/21 1703

## 2021-01-09 NOTE — Discharge Instructions (Addendum)
Follow-up with your family doctor next week to check your blood pressure.  You also have been referred to Dr. Merlene Laughter for headache

## 2021-01-19 ENCOUNTER — Other Ambulatory Visit: Payer: Self-pay

## 2021-01-19 ENCOUNTER — Emergency Department (HOSPITAL_COMMUNITY)
Admission: EM | Admit: 2021-01-19 | Discharge: 2021-01-20 | Disposition: A | Discharge status: Home / Self Care | Payer: Self-pay | Attending: Emergency Medicine | Admitting: Emergency Medicine

## 2021-01-19 DIAGNOSIS — Z79899 Other long term (current) drug therapy: Secondary | ICD-10-CM | POA: Insufficient documentation

## 2021-01-19 DIAGNOSIS — F17210 Nicotine dependence, cigarettes, uncomplicated: Secondary | ICD-10-CM | POA: Insufficient documentation

## 2021-01-19 DIAGNOSIS — I10 Essential (primary) hypertension: Secondary | ICD-10-CM | POA: Insufficient documentation

## 2021-01-19 DIAGNOSIS — J449 Chronic obstructive pulmonary disease, unspecified: Secondary | ICD-10-CM | POA: Insufficient documentation

## 2021-01-19 DIAGNOSIS — R519 Headache, unspecified: Secondary | ICD-10-CM | POA: Insufficient documentation

## 2021-01-19 NOTE — ED Triage Notes (Addendum)
Patient reports persistent headache for several weeks , unrelieved by prescription medications , hypertensive at triage , seen at [facility] last week for the same complaints . Denies head injury , no emesis or photophobia .

## 2021-01-20 ENCOUNTER — Emergency Department (HOSPITAL_COMMUNITY): Payer: Self-pay

## 2021-01-20 LAB — CBC WITH DIFFERENTIAL/PLATELET
Abs Immature Granulocytes: 0.06 10*3/uL (ref 0.00–0.07)
Basophils Absolute: 0 10*3/uL (ref 0.0–0.1)
Basophils Relative: 0 %
Eosinophils Absolute: 0.1 10*3/uL (ref 0.0–0.5)
Eosinophils Relative: 1 %
HCT: 24.4 % — ABNORMAL LOW (ref 36.0–46.0)
Hemoglobin: 7.6 g/dL — ABNORMAL LOW (ref 12.0–15.0)
Immature Granulocytes: 1 %
Lymphocytes Relative: 18 %
Lymphs Abs: 1.8 10*3/uL (ref 0.7–4.0)
MCH: 22 pg — ABNORMAL LOW (ref 26.0–34.0)
MCHC: 31.1 g/dL (ref 30.0–36.0)
MCV: 70.7 fL — ABNORMAL LOW (ref 80.0–100.0)
Monocytes Absolute: 1 10*3/uL (ref 0.1–1.0)
Monocytes Relative: 10 %
Neutro Abs: 6.8 10*3/uL (ref 1.7–7.7)
Neutrophils Relative %: 70 %
Platelets: 498 10*3/uL — ABNORMAL HIGH (ref 150–400)
RBC: 3.45 MIL/uL — ABNORMAL LOW (ref 3.87–5.11)
RDW: 19.3 % — ABNORMAL HIGH (ref 11.5–15.5)
WBC: 9.7 10*3/uL (ref 4.0–10.5)
nRBC: 0 % (ref 0.0–0.2)

## 2021-01-20 LAB — BASIC METABOLIC PANEL
Anion gap: 7 (ref 5–15)
BUN: 5 mg/dL — ABNORMAL LOW (ref 6–20)
CO2: 26 mmol/L (ref 22–32)
Calcium: 9 mg/dL (ref 8.9–10.3)
Chloride: 104 mmol/L (ref 98–111)
Creatinine, Ser: 0.71 mg/dL (ref 0.44–1.00)
GFR, Estimated: >60 mL/min (ref >60)
Glucose, Bld: 90 mg/dL (ref 70–99)
Potassium: 3.5 mmol/L (ref 3.5–5.1)
Sodium: 137 mmol/L (ref 135–145)

## 2021-01-20 MED ORDER — CYCLOBENZAPRINE HCL 10 MG PO TABS
5.0000 mg | ORAL_TABLET | Freq: Once | ORAL | Status: AC
  Administered 2021-01-20: 5 mg via ORAL
  Filled 2021-01-20: qty 1

## 2021-01-20 MED ORDER — BENZONATATE 100 MG PO CAPS
100.0000 mg | ORAL_CAPSULE | Freq: Once | ORAL | Status: AC
  Administered 2021-01-20: 100 mg via ORAL
  Filled 2021-01-20: qty 1

## 2021-01-20 MED ORDER — DIPHENHYDRAMINE HCL 50 MG/ML IJ SOLN
25.0000 mg | Freq: Once | INTRAMUSCULAR | Status: AC
  Administered 2021-01-20: 25 mg via INTRAVENOUS
  Filled 2021-01-20: qty 1

## 2021-01-20 MED ORDER — KETOROLAC TROMETHAMINE 30 MG/ML IJ SOLN
30.0000 mg | Freq: Once | INTRAMUSCULAR | Status: AC
  Administered 2021-01-20: 30 mg via INTRAVENOUS
  Filled 2021-01-20: qty 1

## 2021-01-20 MED ORDER — PROCHLORPERAZINE EDISYLATE 10 MG/2ML IJ SOLN
5.0000 mg | Freq: Once | INTRAMUSCULAR | Status: AC
  Administered 2021-01-20: 5 mg via INTRAVENOUS
  Filled 2021-01-20: qty 2

## 2021-01-20 NOTE — ED Notes (Addendum)
Pt reports no change in condition.  RN recommended we turn out the light, recline on the bed.

## 2021-01-20 NOTE — ED Provider Notes (Signed)
[facility] EMERGENCY DEPARTMENT Provider Note   CSN: [id number] Arrival date & time: 01/19/21  2218     History Chief Complaint  Patient presents with  . Headache  . Hypertension    Joann White is a 45 y.o. female.  HPI     This is a 45 year old female with a history of menorrhagia, chronic headache, COPD, hypertension who presents with headache.  Patient reports she has had a headache for "some time."  However, she states over the last several weeks she has had persistent daily headache.  She describes it as pain over the top of her head that radiates into her neck.  She states that her pain is worse with coughing, laughing, sneezing.  She has tried multiple over-the-counter medications including BC and Goody powder, Aleve, Tylenol.  She also reports taking prescription medication from her primary physician.  She was seen and evaluated 1 week ago for similar symptoms.  At that time she was notably hypertensive and started on Norvasc.  She states that she has been taking Norvasc and losartan.  Denies fevers.  Denies weakness, numbness, strokelike symptoms.  Past Medical History:  Diagnosis Date  . Allergic rhinitis   . Chickenpox   . Chronic daily headache    with vision changes and muffled hearing  . COPD (chronic obstructive pulmonary disease) ([place])   . Dizziness   . Hypertension     Patient Active Problem List   Diagnosis Date Noted  . Menorrhagia 05/04/2017  . Uterine fibroids 05/04/2017  . Anemia 05/04/2017  . Dysuria 12/09/2015  . Tobacco use 11/21/2015  . Essential hypertension 11/16/2015  . Chest pain 11/16/2015  . Headache 11/16/2015  . Swallowing difficulty 11/16/2015    Past Surgical History:  Procedure Laterality Date  . None       OB History    Gravida  4   Para  1   Term  1   Preterm      AB  3   Living  1     SAB  2   IAB  1   Ectopic      Multiple      Live Births              Family History   Problem Relation Age of Onset  . Alcoholism Father   . Lung cancer Father   . Cancer Father        lung  . Healthy Mother   . Arthritis Other        Parent, grandparent  . Heart disease Other        Parent, grandparent  . Hypertension Other        Parent, grandparent  . Diabetes Other        Maternal grandmother and grandfather   . Breast cancer Paternal Aunt   . Lung cancer Paternal Aunt   . Cancer Paternal Aunt        lung  . Stroke Paternal Barbaraann Rondo        First cousin as well  . Sickle cell anemia Other        Half-brother  . Healthy Son   . Sickle cell anemia Brother     Social History   Tobacco Use  . Smoking status: Current Some Day Smoker    Packs/day: 1.00    Years: 18.00    Pack years: 18.00    Types: Cigarettes    Last attempt to quit: 12/31/2015  Years since quitting: 5.0  . Smokeless tobacco: Never Used  Vaping Use  . Vaping Use: Never used  Substance Use Topics  . Alcohol use: Yes    Alcohol/week: 0.0 standard drinks    Comment: rarely  . Drug use: Yes    Types: Marijuana    Comment: occasionally    Home Medications Prior to Admission medications   Medication Sig Start Date End Date Taking? Authorizing Provider  acetaminophen (TYLENOL) 500 MG tablet Take 1 tablet (500 mg total) by mouth every 6 (six) hours as needed. 09/06/19   Law, Bea Graff, PA-C  albuterol (PROVENTIL HFA;VENTOLIN HFA) 108 (90 BASE) MCG/ACT inhaler Inhale 2 puffs into the lungs every 6 (six) hours as needed for wheezing. Reported on 11/14/2015    [provider]  amLODipine (NORVASC) 5 MG tablet Take 1 tablet (5 mg total) by mouth daily. 01/09/21   Milton Ferguson, MD  diclofenac (VOLTAREN) 75 MG EC tablet Take 1 tablet (75 mg total) by mouth 2 (two) times daily. 11/08/18   Fransico Meadow, PA-C  meloxicam (MOBIC) 7.5 MG tablet Take 1 tablet (7.5 mg total) by mouth daily. 09/06/19   Law, Bea Graff, PA-C  methocarbamol (ROBAXIN) 500 MG tablet Take 1 tablet (500 mg total) by  mouth 2 (two) times daily. 09/06/19   Law, Bea Graff, PA-C  predniSONE (DELTASONE) 50 MG tablet Take 1 tablet (50 mg total) by mouth daily. 11/08/18   Fransico Meadow, PA-C    Allergies    Hydromorphone  Review of Systems   Review of Systems  Constitutional: Negative for fever.  Respiratory: Negative for shortness of breath.   Cardiovascular: Negative for chest pain.  Gastrointestinal: Negative for abdominal pain, nausea and vomiting.  Neurological: Positive for headaches. Negative for dizziness, weakness and numbness.  All other systems reviewed and are negative.   Physical Exam Updated Vital Signs BP (!) 154/108   Pulse 91   Temp 98.6 F (37 C) (Oral)   Resp (!) 22   Ht 1.676 m (5\' 6" )   Wt 85 kg   LMP 01/08/2021   SpO2 100%   BMI 30.25 kg/m   Physical Exam Vitals and nursing note reviewed.  Constitutional:      Appearance: She is well-developed. She is not ill-appearing.  HENT:     Head: Normocephalic and atraumatic.     Mouth/Throat:     Mouth: Mucous membranes are moist.  Eyes:     Pupils: Pupils are equal, round, and reactive to light.  Cardiovascular:     Rate and Rhythm: Normal rate and regular rhythm.     Heart sounds: Normal heart sounds.  Pulmonary:     Effort: Pulmonary effort is normal. No respiratory distress.     Breath sounds: No wheezing.  Abdominal:     General: Bowel sounds are normal.     Palpations: Abdomen is soft.  Musculoskeletal:        General: Normal range of motion.     Cervical back: Neck supple.     Comments: Tenderness palpation bilateral paraspinous musculature of the cervical spine, no midline step-off or deformity, no meningismus  Skin:    General: Skin is warm and dry.  Neurological:     Mental Status: She is alert and oriented to person, place, and time.     Comments: Cranial nerves II through XII intact, 5 out of 5 strength in all 4 extremities, no dysmetria to finger-nose-finger  Psychiatric:  Mood and Affect:  Mood normal.     ED Results / Procedures / Treatments   Labs (all labs ordered are listed, but only abnormal results are displayed) Labs Reviewed  CBC WITH DIFFERENTIAL/PLATELET - Abnormal; Notable for the following components:      Result Value   RBC 3.45 (*)    Hemoglobin 7.6 (*)    HCT 24.4 (*)    MCV 70.7 (*)    MCH 22.0 (*)    RDW 19.3 (*)    Platelets 498 (*)    All other components within normal limits  BASIC METABOLIC PANEL - Abnormal; Notable for the following components:   BUN 5 (*)    All other components within normal limits  I-STAT BETA HCG BLOOD, ED ([facility], [facility], [facility] ONLY)    EKG None  Radiology CT Head Wo Contrast  Result Date: 01/20/2021 CLINICAL DATA:  Tension type headache present for several weeks. EXAM: CT HEAD WITHOUT CONTRAST TECHNIQUE: Contiguous axial images were obtained from the base of the skull through the vertex without intravenous contrast. COMPARISON:  01/09/2021 FINDINGS: Brain: No evidence of acute infarction, hemorrhage, hydrocephalus, extra-axial collection or mass lesion/mass effect. Low cerebellar tonsils (at least 6 mm below the foramen magnum) with fullness at the foramen magnum, chronic when compared with a 2017 brain MRI. No clear brain sag when compared to prior MRI. Vascular: No hyperdense vessel or unexpected calcification. Skull: Normal. Negative for fracture or focal lesion. Sinuses/Orbits: No acute finding. IMPRESSION: 1. No acute finding. 2. 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 Electronically Signed   By: Monte Fantasia M.D.   On: 01/20/2021 04:22    Procedures Procedures   Medications Ordered in ED Medications  ketorolac (TORADOL) 30 MG/ML injection 30 mg (30 mg Intravenous Given 01/20/21 0214)  cyclobenzaprine (FLEXERIL) tablet 5 mg (5 mg Oral Given 01/20/21 0211)  benzonatate (TESSALON) capsule 100 mg (100 mg Oral Given 01/20/21 0400)  prochlorperazine (COMPAZINE) injection 5 mg  (5 mg Intravenous Given 01/20/21 0358)  diphenhydrAMINE (BENADRYL) injection 25 mg (25 mg Intravenous Given 01/20/21 0356)    ED Course  I have reviewed the triage vital signs and the nursing notes.  Pertinent labs & imaging results that were available during my care of the patient were reviewed by me and considered in my medical decision making (see chart for details).  Clinical Course as of 01/20/21 0500  Tue Jan 20, 2021  0342 Patient with persistent headache.  Now complaining of cough that is worsening her headache.  Additional medications ordered.  Given headache with Valsalva, will obtain CT head to rule out intracranial mass or lesion. [CH]    Clinical Course User Index [CH] Johnathen Testa, Barbette Hair, MD   MDM Rules/Calculators/A&P                          Patient presents with ongoing intermittent headache.  She is overall nontoxic-appearing.  She is hypertensive with initial blood pressure 184/115.  Reports compliance with her blood pressure medication.  Headache appears acute on chronic.  She has a history of headaches in the past but worsening over the last several weeks.  She is nonfocal.  No red flags.  Doubt meningitis or subarachnoid hemorrhage.  She does have some increased pain with Valsalva including cough or sneezing.  Initially she was given Toradol and Flexeril for possible tension component.  She stated ongoing pain.  For this reason, she was given  a full migraine cocktail to include Compazine and Benadryl.  CT scan was also obtained to rule out intracranial mass or bleed.  This was negative and independently reviewed by myself.  Lab work reviewed from triage.  She has ongoing anemia but has a history of menorrhagia and this is relatively stable.  On recheck, she states she feels much better.  We will have her follow-up with neurology for chronic headache.  After history, exam, and medical workup I feel the patient has been appropriately medically screened and is safe for discharge  home. Pertinent diagnoses were discussed with the patient. Patient was given return precautions.  Final Clinical Impression(s) / ED Diagnoses Final diagnoses:  Bad headache    Rx / DC Orders ED Discharge Orders    None       Afua Hoots, Barbette Hair, MD 01/20/21 0501

## 2021-05-01 ENCOUNTER — Ambulatory Visit
Admission: EM | Admit: 2021-05-01 | Discharge: 2021-05-01 | Disposition: A | Discharge status: Home / Self Care | Payer: BC Managed Care – PPO | Attending: Emergency Medicine | Admitting: Emergency Medicine

## 2021-05-01 ENCOUNTER — Telehealth: Payer: Self-pay | Admitting: Emergency Medicine

## 2021-05-01 ENCOUNTER — Encounter: Payer: Self-pay | Admitting: Emergency Medicine

## 2021-05-01 DIAGNOSIS — R062 Wheezing: Secondary | ICD-10-CM

## 2021-05-01 DIAGNOSIS — Z760 Encounter for issue of repeat prescription: Secondary | ICD-10-CM

## 2021-05-01 DIAGNOSIS — I10 Essential (primary) hypertension: Secondary | ICD-10-CM

## 2021-05-01 MED ORDER — ALBUTEROL SULFATE HFA 108 (90 BASE) MCG/ACT IN AERS: 2.0000 | INHALATION_SPRAY | Freq: Four times a day (QID) | RESPIRATORY_TRACT | 0 refills | Status: DC | PRN

## 2021-05-01 MED ORDER — ONDANSETRON 4 MG PO TBDP
4.0000 mg | ORAL_TABLET | Freq: Once | ORAL | Status: AC
  Administered 2021-05-01: 4 mg via ORAL

## 2021-05-01 MED ORDER — AMLODIPINE BESYLATE 5 MG PO TABS: 5.0000 mg | ORAL_TABLET | Freq: Every day | ORAL | 0 refills | Status: DC

## 2021-05-01 MED ORDER — KETOROLAC TROMETHAMINE 30 MG/ML IJ SOLN
30.0000 mg | Freq: Once | INTRAMUSCULAR | Status: AC
  Administered 2021-05-01: 30 mg via INTRAMUSCULAR

## 2021-05-01 MED ORDER — AMLODIPINE BESYLATE 5 MG PO TABS: 5.0000 mg | ORAL_TABLET | Freq: Every day | ORAL | 1 refills | Status: DC

## 2021-05-01 MED ORDER — DEXAMETHASONE SODIUM PHOSPHATE 10 MG/ML IJ SOLN
10.0000 mg | Freq: Once | INTRAMUSCULAR | Status: AC
  Administered 2021-05-01: 10 mg via INTRAMUSCULAR

## 2021-05-01 NOTE — ED Triage Notes (Signed)
Concerned about blood pressure, states she has not had her bp meds in 2 months d/t her pcp discharged her from the practice.  Also reports having a migraine since she has not had her bp meds. Also reports she has been wheezing for a few months.

## 2021-05-01 NOTE — ED Provider Notes (Signed)
[place]   [id number] 05/01/21 Arrival Time: 0840  YN:[id number] refill  SUBJECTIVE:  Joann White is a 45 y.o. female who presents for blood pressure medication refill.  Hx of HTN x 5 years.  States blood pressure on average is 130/90 with medication.  Has not been on medication for past 2 months.  Takes amlodipine 5 mg daily.  Dismiss by PCP.  Reports migraine.  Denies vision changes, dizziness, lightheadedness, chest pain, shortness of breath, numbness or tingling in extremities, abdominal pain, changes in bowel or bladder habits.     Also reports wheezing.  Has hx of COPD.  Does not have inhaler.    ROS: As per HPI.  All other pertinent ROS negative.     Past Medical History:  Diagnosis Date   Allergic rhinitis    Chickenpox    Chronic daily headache    with vision changes and muffled hearing   COPD (chronic obstructive pulmonary disease) ([facility])    Dizziness    Hypertension    Past Surgical History:  Procedure Laterality Date   None     Allergies  Allergen Reactions   Hydromorphone Nausea And Vomiting and Other (See Comments)    Dizziness    No current facility-administered medications on file prior to encounter.   Current Outpatient Medications on File Prior to Encounter  Medication Sig Dispense Refill   acetaminophen (TYLENOL) 500 MG tablet Take 1 tablet (500 mg total) by mouth every 6 (six) hours as needed. 30 tablet 0   Social History   Socioeconomic History   Marital status: Married    Spouse name: Not on file   Number of children: Not on file   Years of education: Not on file   Highest education level: Not on file  Occupational History   Not on file  Tobacco Use   Smoking status: Some Days    Packs/day: 1.00    Years: 18.00    Pack years: 18.00    Types: Cigarettes    Last attempt to quit: 12/31/2015    Years since quitting: 5.3   Smokeless tobacco: Never  Vaping Use   Vaping Use: Never used  Substance and Sexual Activity    Alcohol use: Yes    Alcohol/week: 0.0 standard drinks    Comment: rarely   Drug use: Yes    Types: Marijuana    Comment: occasionally   Sexual activity: Yes    Birth control/protection: None  Other Topics Concern   Not on file  Social History Narrative   Lives with husband in a one story home.  Has one child.     Works as a Publishing copy.  Education: some college.   Social Determinants of Health   Financial Resource Strain: Not on file  Food Insecurity: Not on file  Transportation Needs: Not on file  Physical Activity: Not on file  Stress: Not on file  Social Connections: Not on file  Intimate Partner Violence: Not on file   Family History  Problem Relation Age of Onset   Alcoholism Father    Lung cancer Father    Cancer Father        lung   Healthy Mother    Arthritis Other        Parent, grandparent   Heart disease Other        Parent, grandparent   Hypertension Other        Parent, grandparent   Diabetes Other  Maternal grandmother and grandfather    Breast cancer Paternal Aunt    Lung cancer Paternal Aunt    Cancer Paternal Aunt        lung   Stroke Paternal Uncle        First cousin as well   Sickle cell anemia Other        Half-brother   Healthy Son    Sickle cell anemia Brother     OBJECTIVE:  Vitals:   05/01/21 0922  BP: (!) 156/98  Pulse: 78  Resp: 18  Temp: 98.7 F (37.1 C)  TempSrc: Oral  SpO2: 97%    General appearance: alert; no distress Eyes: PERRLA; EOMI HENT: normocephalic; atraumatic Neck: supple with FROM Lungs: subtle wheezing throughout Heart: regular rate and rhythm.   Extremities: no edema; symmetrical with no gross deformities Skin: warm and dry Neurologic: CN 2-12 grossly intact; normal gait; strength and sensation intact bilaterally about the upper and lower extremities Psychological: alert and cooperative; normal mood and affect  ASSESSMENT & PLAN:  1. Medication refill   2. Hypertension, unspecified  type     Meds ordered this encounter  Medications   albuterol (VENTOLIN HFA) 108 (90 Base) MCG/ACT inhaler    Sig: Inhale 2 puffs into the lungs every 6 (six) hours as needed for wheezing. Reported on 11/14/2015    Dispense:  18 g    Refill:  0    Order Specific Question:   Supervising Provider    Answer:   Raylene Everts [[id number]]   amLODipine (NORVASC) 5 MG tablet    Sig: Take 1 tablet (5 mg total) by mouth daily.    Dispense:  30 tablet    Refill:  1    Order Specific Question:   Supervising Provider    Answer:   Raylene Everts [[id number]]   ketorolac (TORADOL) 30 MG/ML injection 30 mg   dexamethasone (DECADRON) injection 10 mg   ondansetron (ZOFRAN-ODT) disintegrating tablet 4 mg   Blood pressure medication refilled Please continue to monitor blood pressure at home and keep a log Eat a well balanced diet of fruits, vegetables and lean meats.  Avoid foods high in fat and salt Drink water.  At least half your body weight in ounces Exercise for at least 30 minutes daily Follow up with PCP Return or go to the ED if you have any new or worsening symptoms such as vision changes, fatigue, dizziness, chest pain, shortness of breath, nausea, swelling in your hands or feet, urinary symptoms, etc...   Inhaler for wheezing refilled  Migraine cocktail given in office  Reviewed expectations re: course of current medical issues. Questions answered. Outlined signs and symptoms indicating need for more acute intervention. Patient verbalized understanding. After Visit Summary given.    Lestine Box, PA-C 05/01/21 [phone]

## 2021-05-01 NOTE — Discharge Instructions (Addendum)
Blood pressure medication refilled Please continue to monitor blood pressure at home and keep a log Eat a well balanced diet of fruits, vegetables and lean meats.  Avoid foods high in fat and salt Drink water.  At least half your body weight in ounces Exercise for at least 30 minutes daily Follow up with PCP Return or go to the ED if you have any new or worsening symptoms such as vision changes, fatigue, dizziness, chest pain, shortness of breath, nausea, swelling in your hands or feet, urinary symptoms, etc..Marland Kitchen

## 2021-05-01 NOTE — Telephone Encounter (Signed)
Paper prescription printed, electronic prescription sent to pharmacy

## 2021-06-03 ENCOUNTER — Other Ambulatory Visit: Payer: Self-pay | Admitting: Obstetrics and Gynecology

## 2021-06-03 DIAGNOSIS — D509 Iron deficiency anemia, unspecified: Secondary | ICD-10-CM

## 2021-06-17 ENCOUNTER — Encounter (HOSPITAL_COMMUNITY): Payer: BC Managed Care – PPO

## 2021-07-08 ENCOUNTER — Encounter: Payer: Self-pay | Admitting: Neurology

## 2021-07-15 ENCOUNTER — Encounter (HOSPITAL_COMMUNITY): Payer: BC Managed Care – PPO

## 2021-09-02 ENCOUNTER — Ambulatory Visit: Payer: BC Managed Care – PPO | Admitting: Neurology

## 2021-09-14 ENCOUNTER — Ambulatory Visit: Payer: BC Managed Care – PPO | Admitting: Neurology

## 2021-09-16 ENCOUNTER — Emergency Department (HOSPITAL_COMMUNITY)
Admission: EM | Admit: 2021-09-16 | Discharge: 2021-09-16 | Disposition: A | Discharge status: Home / Self Care | Payer: BC Managed Care – PPO | Attending: Emergency Medicine | Admitting: Emergency Medicine

## 2021-09-16 ENCOUNTER — Other Ambulatory Visit: Payer: Self-pay

## 2021-09-16 ENCOUNTER — Encounter (HOSPITAL_COMMUNITY): Payer: Self-pay | Admitting: Emergency Medicine

## 2021-09-16 DIAGNOSIS — R519 Headache, unspecified: Secondary | ICD-10-CM | POA: Insufficient documentation

## 2021-09-16 DIAGNOSIS — J449 Chronic obstructive pulmonary disease, unspecified: Secondary | ICD-10-CM | POA: Diagnosis not present

## 2021-09-16 DIAGNOSIS — F17210 Nicotine dependence, cigarettes, uncomplicated: Secondary | ICD-10-CM | POA: Diagnosis not present

## 2021-09-16 DIAGNOSIS — Z79899 Other long term (current) drug therapy: Secondary | ICD-10-CM | POA: Insufficient documentation

## 2021-09-16 DIAGNOSIS — I10 Essential (primary) hypertension: Secondary | ICD-10-CM | POA: Diagnosis not present

## 2021-09-16 MED ORDER — KETOROLAC TROMETHAMINE 30 MG/ML IJ SOLN
30.0000 mg | Freq: Once | INTRAMUSCULAR | Status: AC
  Administered 2021-09-16: 30 mg via INTRAVENOUS
  Filled 2021-09-16: qty 1

## 2021-09-16 MED ORDER — PROCHLORPERAZINE EDISYLATE 10 MG/2ML IJ SOLN
10.0000 mg | Freq: Once | INTRAMUSCULAR | Status: AC
  Administered 2021-09-16: 10 mg via INTRAVENOUS
  Filled 2021-09-16: qty 2

## 2021-09-16 MED ORDER — TOPIRAMATE 25 MG PO TABS: 25.0000 mg | ORAL_TABLET | Freq: Every day | ORAL | 0 refills | Status: DC

## 2021-09-16 MED ORDER — SODIUM CHLORIDE 0.9 % IV BOLUS
1000.0000 mL | Freq: Once | INTRAVENOUS | Status: AC
  Administered 2021-09-16: 1000 mL via INTRAVENOUS

## 2021-09-16 MED ORDER — DEXAMETHASONE SODIUM PHOSPHATE 10 MG/ML IJ SOLN
10.0000 mg | Freq: Once | INTRAMUSCULAR | Status: AC
  Administered 2021-09-16: 10 mg via INTRAVENOUS
  Filled 2021-09-16: qty 1

## 2021-09-16 MED ORDER — DIPHENHYDRAMINE HCL 50 MG/ML IJ SOLN
25.0000 mg | Freq: Once | INTRAMUSCULAR | Status: AC
  Administered 2021-09-16: 25 mg via INTRAVENOUS
  Filled 2021-09-16: qty 1

## 2021-09-16 NOTE — Discharge Instructions (Signed)
Try avoiding taking any Tylenol, ibuprofen, Naprosyn for several days.  Sometimes taking these medications daily can cause you to have headaches.  Try the Topamax daily to see if it helps.  Follow-up with your primary doctor.

## 2021-09-16 NOTE — ED Provider Notes (Signed)
[facility] Provider Note   CSN: [id number] Arrival date & time: 09/16/21  0409     History Chief Complaint  Patient presents with   Headache    Joann White is a 45 y.o. female.  Patient presents to the emergency department for evaluation of headache.  Patient has daily headaches.  Patient has a previous history of Chiari malformation, has seen neurosurgery for this.  It is unclear if this is the cause of her headaches.  She is scheduled to see a neurologist but not for a couple of months.  Patient uses ibuprofen daily.  She also sometimes uses Naprosyn or Tylenol.  Patient reports for the last couple of weeks the headache has been worse.  She reports a throbbing pain in the posterior aspect of her head.  No fever.  No neck pain or stiffness.      Past Medical History:  Diagnosis Date   Allergic rhinitis    Chickenpox    Chronic daily headache    with vision changes and muffled hearing   COPD (chronic obstructive pulmonary disease) (HCC)    Dizziness    Hypertension     Patient Active Problem List   Diagnosis Date Noted   Menorrhagia 05/04/2017   Uterine fibroids 05/04/2017   Anemia 05/04/2017   Dysuria 12/09/2015   Tobacco use 11/21/2015   Essential hypertension 11/16/2015   Chest pain 11/16/2015   Headache 11/16/2015   Swallowing difficulty 11/16/2015    Past Surgical History:  Procedure Laterality Date   None       OB History     Gravida  4   Para  1   Term  1   Preterm      AB  3   Living  1      SAB  2   IAB  1   Ectopic      Multiple      Live Births              Family History  Problem Relation Age of Onset   Alcoholism Father    Lung cancer Father    Cancer Father        lung   Healthy Mother    Arthritis Other        Parent, grandparent   Heart disease Other        Parent, grandparent   Hypertension Other        Parent, grandparent   Diabetes Other        Maternal grandmother and  grandfather    Breast cancer Paternal Aunt    Lung cancer Paternal Aunt    Cancer Paternal Aunt        lung   Stroke Paternal Uncle        First cousin as well   Sickle cell anemia Other        Half-brother   Healthy Son    Sickle cell anemia Brother     Social History   Tobacco Use   Smoking status: Some Days    Packs/day: 1.00    Years: 18.00    Pack years: 18.00    Types: Cigarettes    Last attempt to quit: 12/31/2015    Years since quitting: 5.7   Smokeless tobacco: Never  Vaping Use   Vaping Use: Never used  Substance Use Topics   Alcohol use: Yes    Alcohol/week: 0.0 standard drinks    Comment: rarely   Drug use:  Yes    Types: Marijuana    Comment: occasionally    Home Medications Prior to Admission medications   Medication Sig Start Date End Date Taking? Authorizing Provider  topiramate (TOPAMAX) 25 MG tablet Take 1 tablet (25 mg total) by mouth at bedtime. 09/16/21  Yes Kenlee Maler, Gwenyth Allegra, MD  acetaminophen (TYLENOL) 500 MG tablet Take 1 tablet (500 mg total) by mouth every 6 (six) hours as needed. 09/06/19   Law, Bea Graff, PA-C  albuterol (VENTOLIN HFA) 108 (90 Base) MCG/ACT inhaler Inhale 2 puffs into the lungs every 6 (six) hours as needed for wheezing. Reported on 11/14/2015 05/01/21   Lestine Box, PA-C  amLODipine (NORVASC) 5 MG tablet Take 1 tablet (5 mg total) by mouth daily. 05/01/21   Wurst, [place], PA-C  amLODipine (NORVASC) 5 MG tablet Take 1 tablet (5 mg total) by mouth daily. 05/01/21   Wurst, [place], PA-C    Allergies    Hydromorphone  Review of Systems   Review of Systems  Neurological:  Positive for headaches.  All other systems reviewed and are negative.  Physical Exam Updated Vital Signs BP (!) 132/95   Pulse 94   Temp 98.2 F (36.8 C) (Oral)   Resp 16   Ht 5\' 6"  (1.676 m)   Wt 81.6 kg   LMP 09/09/2021   SpO2 100%   BMI 29.05 kg/m   Physical Exam Vitals and nursing note reviewed.  Constitutional:      General: She  is not in acute distress.    Appearance: Normal appearance. She is well-developed.  HENT:     Head: Normocephalic and atraumatic.     Right Ear: Hearing normal.     Left Ear: Hearing normal.     Nose: Nose normal.  Eyes:     Conjunctiva/sclera: Conjunctivae normal.     Pupils: Pupils are equal, round, and reactive to light.  Cardiovascular:     Rate and Rhythm: Regular rhythm.     Heart sounds: S1 normal and S2 normal. No murmur heard.   No friction rub. No gallop.  Pulmonary:     Effort: Pulmonary effort is normal. No respiratory distress.     Breath sounds: Normal breath sounds.  Chest:     Chest wall: No tenderness.  Abdominal:     General: Bowel sounds are normal.     Palpations: Abdomen is soft.     Tenderness: There is no abdominal tenderness. There is no guarding or rebound. Negative signs include Murphy's sign and McBurney's sign.     Hernia: No hernia is present.  Musculoskeletal:        General: Normal range of motion.     Cervical back: Normal range of motion and neck supple.  Skin:    General: Skin is warm and dry.     Findings: No rash.  Neurological:     Mental Status: She is alert and oriented to person, place, and time.     GCS: GCS eye subscore is 4. GCS verbal subscore is 5. GCS motor subscore is 6.     Cranial Nerves: No cranial nerve deficit.     Sensory: No sensory deficit.     Coordination: Coordination normal.     Comments: Extraocular muscle movement: normal No visual field cut Pupils: equal and reactive both direct and consensual response is normal No nystagmus present    Sensory function is intact to light touch, pinprick Proprioception intact  Grip strength 5/5 symmetric in upper extremities No pronator drift  Normal finger to nose bilaterally  Lower extremity strength 5/5 against gravity Normal heel to shin bilaterally  Gait: normal   Psychiatric:        Speech: Speech normal.        Behavior: Behavior normal.        Thought Content:  Thought content normal.    ED Results / Procedures / Treatments   Labs (all labs ordered are listed, but only abnormal results are displayed) Labs Reviewed - No data to display  EKG None  Radiology No results found.  Procedures Procedures   Medications Ordered in ED Medications  ketorolac (TORADOL) 30 MG/ML injection 30 mg (has no administration in time range)  prochlorperazine (COMPAZINE) injection 10 mg (has no administration in time range)  diphenhydrAMINE (BENADRYL) injection 25 mg (has no administration in time range)  sodium chloride 0.9 % bolus 1,000 mL (1,000 mLs Intravenous New Bag/Given 09/16/21 0437)  dexamethasone (DECADRON) injection 10 mg (10 mg Intravenous Given 09/16/21 0438)    ED Course  I have reviewed the triage vital signs and the nursing notes.  Pertinent labs & imaging results that were available during my care of the patient were reviewed by me and considered in my medical decision making (see chart for details).    MDM Rules/Calculators/A&P                           Patient presents to the emergency department for evaluation of headache.  Reviewing her records reveals frequent, daily headaches.  Etiology has been unclear.  It has been speculated that her headache may be from her Chiari malformation.  She does take daily NSAIDs, may be suffering from analgesic rebound headache.  Her neurologic exam is normal.  No red flags.  She has had previous imaging, does not require repeat imaging today.  Treated as a migraine headache.  We will try daily Topamax while she is awaiting her neurology appointment.  Final Clinical Impression(s) / ED Diagnoses Final diagnoses:  Bad headache    Rx / DC Orders ED Discharge Orders          Ordered    topiramate (TOPAMAX) 25 MG tablet  Daily at bedtime        09/16/21 0439             Orpah Greek, MD 09/16/21 [phone]

## 2021-09-16 NOTE — ED Triage Notes (Signed)
Pt c/o intermittent headache for weeks.

## 2021-09-16 NOTE — ED Notes (Signed)
Pt removed IV herself and walked out of ED. MD notified.

## 2021-10-28 ENCOUNTER — Encounter: Payer: Self-pay | Admitting: Cardiology

## 2021-10-28 NOTE — Progress Notes (Deleted)
Cardiology Office Note  Date: 10/28/2021   ID: Joann White, DOB [birth year], MRN [id number]  PCP:  Joann Grove Nation, MD  Cardiologist:  None Electrophysiologist:  None   No chief complaint on file.   History of Present Illness: Joann White is a 44 y.o. female referred for cardiology consultation by Dr. Jimmye Norman for evaluation of chest pain.  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 30 tablet 0   albuterol (VENTOLIN HFA) 108 (90 Base) MCG/ACT inhaler Inhale 2 puffs into the lungs every 6 (six) hours as needed for wheezing. Reported on 11/14/2015 18 g 0   amLODipine (NORVASC) 5 MG tablet Take 1 tablet (5 mg total) by mouth daily. 30 tablet 1   amLODipine (NORVASC) 5 MG tablet Take 1 tablet (5 mg total) by mouth daily. 30 tablet 0   topiramate (TOPAMAX) 25 MG tablet Take 1 tablet (25 mg total) by mouth at bedtime. 30 tablet 0   No current facility-administered medications for this visit.   Allergies:  Hydromorphone   Social History: The patient  reports that she has been smoking cigarettes. She has a 18.00 pack-year smoking history. She has never used smokeless tobacco. She reports current alcohol use. She reports current drug use. Drug: Marijuana.   Family History: The patient's family history includes Alcoholism in her father; COPD in her mother; Hypertension in her mother; Lung cancer in her father.   ROS:  Please see the history of present illness. Otherwise, complete review of systems is positive for {NONE DEFAULTED:18576}.  All other systems are reviewed and  negative.   Physical Exam: VS:  There were no vitals taken for this visit., BMI There is no height or weight on file to calculate BMI.  Wt Readings from Last 3 Encounters:  09/16/21 180 lb (81.6 kg)  01/19/21 187 lb 6.3 oz (85 kg)  01/09/21 170 lb (77.1 kg)    General: Patient appears comfortable at rest. HEENT: Conjunctiva and lids normal, oropharynx clear with moist mucosa. Neck: Supple, no elevated JVP or carotid bruits, no thyromegaly. Lungs: Clear to auscultation, nonlabored breathing at rest. Cardiac: Regular rate and rhythm, no S3 or significant systolic murmur, no pericardial rub. Abdomen: Soft, nontender, no hepatomegaly, bowel sounds present, no guarding or rebound. Extremities: No pitting edema, distal pulses 2+. Skin: Warm and dry. Musculoskeletal: No kyphosis. Neuropsychiatric: Alert and oriented x3, affect grossly appropriate.  ECG:  An ECG dated 09/24/2021 was personally reviewed today and demonstrated:  Sinus rhythm.  Recent Labwork: 01/19/2021: BUN 5; Creatinine, Ser 0.71; Hemoglobin 7.6; Platelets 498; Potassium 3.5; Sodium 137     Component Value Date/Time   CHOL 145 11/14/2015 0949   TRIG 142.0 11/14/2015 0949   HDL 33.10 (L) 11/14/2015 0949   CHOLHDL 4 11/14/2015 0949   VLDL 28.4 11/14/2015 0949   LDLCALC 83 11/14/2015 0949  November 2022: BUN 10, creatinine 0.85, potassium 4.0, AST 11, ALT 11, hemoglobin 10.2, platelets 272  Other Studies Reviewed Today:  Chest x-ray 02/06/2021 [facility] [place]): FINDINGS:  Cardiomediastinal silhouette unchanged in size and contour. No  evidence of central vascular congestion. No interlobular septal  thickening. No pneumothorax or pleural effusion. No confluent  airspace disease. Minimal degenerative changes the spine. No acute  displaced fracture   IMPRESSION:  Negative for acute cardiopulmonary disease   Assessment and Plan:    Medication Adjustments/Labs and Tests Ordered: Current medicines are reviewed at  length with the patient today.  Concerns regarding medicines are outlined above.   Tests Ordered: No orders of the defined types were placed in this encounter.   Medication Changes: No orders of the defined types were placed in this encounter.   Disposition:  Follow up {follow up:15908}  Signed, Satira Sark, MD, [facility] 10/28/2021 11:06 AM    [facility] at [place], [place], [place] [address] Phone: [phone]; Fax: [phone]

## 2021-10-29 ENCOUNTER — Ambulatory Visit (INDEPENDENT_AMBULATORY_CARE_PROVIDER_SITE_OTHER): Payer: Self-pay

## 2021-10-29 ENCOUNTER — Encounter: Payer: Self-pay | Admitting: Cardiology

## 2021-10-29 ENCOUNTER — Other Ambulatory Visit: Payer: Self-pay

## 2021-10-29 ENCOUNTER — Ambulatory Visit: Payer: Medicaid Other | Admitting: Cardiology

## 2021-10-29 ENCOUNTER — Other Ambulatory Visit: Payer: Self-pay | Admitting: Cardiology

## 2021-10-29 ENCOUNTER — Ambulatory Visit (INDEPENDENT_AMBULATORY_CARE_PROVIDER_SITE_OTHER): Payer: Self-pay | Admitting: Cardiology

## 2021-10-29 VITALS — BP 118/82 | HR 86 | Ht 66.0 in | Wt 177.2 lb

## 2021-10-29 DIAGNOSIS — R002 Palpitations: Secondary | ICD-10-CM

## 2021-10-29 DIAGNOSIS — I10 Essential (primary) hypertension: Secondary | ICD-10-CM

## 2021-10-29 HISTORY — DX: Palpitations: R00.2

## 2021-10-29 NOTE — Patient Instructions (Addendum)
Medication Instructions:  Your physician recommends that you continue on your current medications as directed. Please refer to the Current Medication list given to you today.  Labwork: none  Testing/Procedures: ZIO- Long Term Monitor Instructions   Your physician has requested you wear your ZIO patch monitor 14 days.   This is a single patch monitor.  [employer] supplies one patch monitor per enrollment.  Additional stickers are not available.   Please do not apply patch if you will be having a Nuclear Stress Test, Echocardiogram, Cardiac CT, MRI, or Chest Xray during the time frame you would be wearing the monitor. The patch cannot be worn during these tests.  You cannot remove and re-apply the ZIO XT patch monitor.   Your ZIO patch monitor will be sent USPS Priority mail from [facility] directly to your home address. The monitor may also be mailed to a PO BOX if home delivery is not available.   It may take 3-5 days to receive your monitor after you have been enrolled.   Once you have received you monitor, please review enclosed instructions.  Your monitor has already been registered assigning a specific monitor serial # to you.   Applying the monitor   Shave hair from upper left chest.   Hold abrader disc by orange tab.  Rub abrader in 40 strokes over left upper chest as indicated in your monitor instructions.   Clean area with 4 enclosed alcohol pads .  Use all pads to assure are is cleaned thoroughly.  Let dry.   Apply patch as indicated in monitor instructions.  Patch will be place under collarbone on left side of chest with arrow pointing upward.   Rub patch adhesive wings for 2 minutes.Remove white label marked "1".  Remove white label marked "2".  Rub patch adhesive wings for 2 additional minutes.   While looking in a mirror, press and release button in center of patch.  A small green light will flash 3-4 times .  This will be your only indicator the monitor has been  turned on.     Do not shower for the first 24 hours.  You may shower after the first 24 hours.   Press button if you feel a symptom. You will hear a small click.  Record Date, Time and Symptom in the Patient Log Book.   When you are ready to remove patch, follow instructions on last 2 pages of Patient Log Book.  Stick patch monitor onto last page of Patient Log Book.   Place Patient Log Book in [place] box.  Use locking tab on box and tape box closed securely.  The [employer] has [employer] on it.  Please place in mailbox as soon as possible.  Your physician should have your test results approximately 7 days after the monitor has been mailed back to [facility].   Call [place] at [phone] if you have questions regarding your ZIO XT patch monitor.  Call them immediately if you see an orange light blinking on your monitor.   If your monitor falls off in less than 4 days contact our Monitor department at [phone].  If your monitor becomes loose or falls off after 4 days call [employer] at [phone] for suggestions on securing your monitor.  Follow-Up: Your physician recommends that you schedule a follow-up appointment in: pending  Any Other Special Instructions Will Be Listed Below (If Applicable).  If you need a refill on your cardiac medications before  your next appointment, please call your pharmacy.

## 2021-10-29 NOTE — Progress Notes (Signed)
Cardiology Office Note  Date: 10/29/2021   ID: Mireille, [place] [birth year], MRN [id number]  PCP:  [place] Nation, MD  Cardiologist:  Rozann Lesches, MD Electrophysiologist:  None   Chief Complaint  Patient presents with   Palpitations    History of Present Illness: Joann White is a 45 y.o. female referred for cardiology consultation by Dr. Jimmye Norman for evaluation of chest pain.  We discussed her symptoms today, she actually describes more of a sense of palpitations.  She states that about two months ago she experienced a "fluttering" in her chest.  This lasted for about one minute and she felt short of breath during this time.  She has had a total of three episodes like this, most recently about one week ago.  She has not had any syncope associated with this.  No exertional chest pain.  She does not have any history of cardiac arrhythmias.  I reviewed her medications which are outlined below.  She has been on Norvasc for treatment of hypertension for the last few years.  Past Medical History:  Diagnosis Date   Allergic rhinitis    Anxiety    Arnold-Chiari malformation, type I (HCC)    Chickenpox    Chronic daily headache    Essential hypertension     Past Surgical History:  Procedure Laterality Date   None      Current Outpatient Medications  Medication Sig Dispense Refill   acetaminophen (TYLENOL) 500 MG tablet Take 1 tablet (500 mg total) by mouth every 6 (six) hours as needed. 30 tablet 0   albuterol (VENTOLIN HFA) 108 (90 Base) MCG/ACT inhaler Inhale 2 puffs into the lungs every 6 (six) hours as needed for wheezing. Reported on 11/14/2015 18 g 0   amLODipine (NORVASC) 10 MG tablet Take 5 mg by mouth daily.     famotidine (PEPCID) 20 MG tablet Take 1 tablet by mouth as needed.     ferrous sulfate 325 (65 FE) MG tablet Take 1 tablet by mouth every other day.     hydrOXYzine (VISTARIL) 50 MG capsule Take 50 mg by mouth 3 (three) times daily.     ibuprofen  (ADVIL) 200 MG tablet Take 200 mg by mouth as needed.     naproxen sodium (ANAPROX) 550 MG tablet Take 550 mg by mouth 2 (two) times daily as needed.     norethindrone (AYGESTIN) 5 MG tablet Take 1 tablet by mouth every other day.     ondansetron (ZOFRAN-ODT) 4 MG disintegrating tablet as needed.     SUMAtriptan (IMITREX) 25 MG tablet Take 1 tablet by mouth as needed.     topiramate (TOPAMAX) 25 MG tablet Take 1 tablet (25 mg total) by mouth at bedtime. 30 tablet 0   No current facility-administered medications for this visit.   Allergies:  Hydromorphone   Social History: The patient  reports that she has been smoking cigarettes. She has a 18.00 pack-year smoking history. She has never used smokeless tobacco. She reports current alcohol use. She reports current drug use. Drug: Marijuana.   Family History: The patient's family history includes Alcoholism in her father; COPD in her mother; Hypertension in her mother; Lung cancer in her father.   ROS: No orthopnea or PND.  Physical Exam: VS:  BP 118/82    Pulse 86    Ht 5\' 6"  (1.676 m)    Wt 177 lb 3.2 oz (80.4 kg)    SpO2 99%    BMI 28.60  kg/m , BMI Body mass index is 28.6 kg/m.  Wt Readings from Last 3 Encounters:  10/29/21 177 lb 3.2 oz (80.4 kg)  09/16/21 180 lb (81.6 kg)  01/19/21 187 lb 6.3 oz (85 kg)    General: Patient appears comfortable at rest. HEENT: Conjunctiva and lids normal, wearing a mask. Neck: Supple, no elevated JVP or carotid bruits, no thyromegaly. Lungs: Clear to auscultation, nonlabored breathing at rest. Cardiac: Regular rate and rhythm, no S3 or significant systolic murmur, no pericardial rub. Abdomen: Soft, bowel sounds present. Extremities: No pitting edema, distal pulses 2+. Skin: Warm and dry. Musculoskeletal: No kyphosis. Neuropsychiatric: Alert and oriented x3, affect grossly appropriate.  ECG:  An ECG dated 09/24/2021 was personally reviewed today and demonstrated:  Sinus rhythm.  Recent  Labwork: 01/19/2021: BUN 5; Creatinine, Ser 0.71; Hemoglobin 7.6; Platelets 498; Potassium 3.5; Sodium 137     Component Value Date/Time   CHOL 145 11/14/2015 0949   TRIG 142.0 11/14/2015 0949   HDL 33.10 (L) 11/14/2015 0949   CHOLHDL 4 11/14/2015 0949   VLDL 28.4 11/14/2015 0949   LDLCALC 83 11/14/2015 0949  November 2022: BUN 10, creatinine 0.85, potassium 4.0, AST 11, ALT 11, hemoglobin 10.2, platelets 272  Other Studies Reviewed Today:  Chest x-ray 02/06/2021 [facility] [place]): FINDINGS:  Cardiomediastinal silhouette unchanged in size and contour. No  evidence of central vascular congestion. No interlobular septal  thickening. No pneumothorax or pleural effusion. No confluent  airspace disease. Minimal degenerative changes the spine. No acute  displaced fracture   IMPRESSION:  Negative for acute cardiopulmonary disease   Assessment and Plan:  1.  Palpitations as discussed above.  No associated syncope or obvious precipitant.  Her baseline ECG is normal.  I also reviewed her lab work from November.  She does not have any prior documented cardiac arrhythmias.  We will plan a 14-day Zio patch for further investigation.  2.  Essential hypertension on Norvasc.  Blood pressure is well controlled today.  Medication Adjustments/Labs and Tests Ordered: Current medicines are reviewed at length with the patient today.  Concerns regarding medicines are outlined above.   Tests Ordered: No orders of the defined types were placed in this encounter.   Medication Changes: No orders of the defined types were placed in this encounter.   Disposition:  Follow up  test results.  Signed, Satira Sark, MD, [facility] 10/29/2021 2:25 PM    [facility] at [place], [place], [place] [address] Phone: [phone]; Fax: [phone]

## 2021-11-01 DIAGNOSIS — R002 Palpitations: Secondary | ICD-10-CM

## 2021-11-08 DIAGNOSIS — R202 Paresthesia of skin: Secondary | ICD-10-CM

## 2021-11-08 HISTORY — DX: Paresthesia of skin: R20.2

## 2021-11-25 ENCOUNTER — Telehealth: Payer: Self-pay | Admitting: *Deleted

## 2021-11-25 NOTE — Telephone Encounter (Signed)
-----   Message from Satira Sark, MD sent at 11/18/2021  5:24 PM EST ----- Results reviewed.  Cardiac monitor showed no sustained arrhythmias or significant pauses.  There were rare PACs and PVCs which she could sense as intermittent palpitations, but nothing dangerous to her.  Also limited episode of junctional tachycardia.  Does not necessarily require specific treatment unless she is consistently bothered by palpitations.  In that case could consider initiation of low-dose beta-blocker such as Toprol-XL 12.5 mg daily and titrate from there.

## 2021-11-25 NOTE — Telephone Encounter (Signed)
Patient informed and not wanting to start any new meds at this time. Copy sent to PCP

## 2021-12-04 ENCOUNTER — Ambulatory Visit (INDEPENDENT_AMBULATORY_CARE_PROVIDER_SITE_OTHER): Payer: BC Managed Care – PPO | Admitting: Neurology

## 2021-12-04 ENCOUNTER — Other Ambulatory Visit: Payer: Self-pay

## 2021-12-04 ENCOUNTER — Encounter: Payer: Self-pay | Admitting: Neurology

## 2021-12-04 VITALS — BP 140/91 | HR 97 | Ht 66.0 in | Wt 183.0 lb

## 2021-12-04 DIAGNOSIS — R202 Paresthesia of skin: Secondary | ICD-10-CM

## 2021-12-04 DIAGNOSIS — R52 Pain, unspecified: Secondary | ICD-10-CM

## 2021-12-04 NOTE — Patient Instructions (Addendum)
Nerve testing of the left arm and leg  ELECTROMYOGRAM AND NERVE CONDUCTION STUDIES (EMG/NCS) INSTRUCTIONS  How to Prepare The neurologist conducting the EMG will need to know if you have certain medical conditions. Tell the neurologist and other EMG lab personnel if you: Have a pacemaker or any other electrical medical device Take blood-thinning medications Have hemophilia, a blood-clotting disorder that causes prolonged bleeding Bathing Take a shower or bath shortly before your exam in order to remove oils from your skin. Dont apply lotions or creams before the exam.  What to Expect Youll likely be asked to change into a hospital gown for the procedure and lie down on an examination table. The following explanations can help you understand what will happen during the exam.  Electrodes. The neurologist or a technician places surface electrodes at various locations on your skin depending on where youre experiencing symptoms. Or the neurologist may insert needle electrodes at different sites depending on your symptoms.  Sensations. The electrodes will at times transmit a tiny electrical current that you may feel as a twinge or spasm. The needle electrode may cause discomfort or pain that usually ends shortly after the needle is removed. If you are concerned about discomfort or pain, you may want to talk to the neurologist about taking a short break during the exam.  Instructions. During the needle EMG, the neurologist will assess whether there is any spontaneous electrical activity when the muscle is at rest - activity that isnt present in healthy muscle tissue - and the degree of activity when you slightly contract the muscle.  He or she will give you instructions on resting and contracting a muscle at appropriate times. Depending on what muscles and nerves the neurologist is examining, he or she may ask you to change positions during the exam.  After your EMG You may experience some temporary,  minor bruising where the needle electrode was inserted into your muscle. This bruising should fade within several days. If it persists, contact your primary care doctor.

## 2021-12-04 NOTE — Progress Notes (Signed)
[facility] Clinic Note - Initial Visit   Date: 12/04/21  KANDA DELUNA MRN: [id number] DOB: [birth year]   Dear Dr. Reatha Armour:  Thank you for your kind referral of Joann White for consultation of headaches. Although her history is well known to you, please allow [name] to reiterate it for the purpose of our medical record. The patient was accompanied to the clinic by self.   History of Present Illness: Joann White is a 46 y.o. right-handed female with Chiari I malformation, anxiety, and tobacco use  presenting for evaluation of headaches.  She also tells me that she is here for "nerve pain".  Upon further questions, she has head pain which involves the top of her head and back of her head. It is worse with coughing, sneezing, or straining. Her primary complain is whole body "nerve pain", which she describes as pins/needles and itching involving the face, arms, legs, torso, and groin. She feels as "if a hook was on my clitoris".  She has random pain spells which makes her twitch and feels irritable.  She has a lot of anxiety and started plucking her facial hair, which has resulted in hyperpigmentation of the face.  She reports always carrying a tweezers with her. She was seeing a psychiatrist, but did not have a good rapport, so not seeing anyone currently.  She is taking topiramate which helps for about two hours.  MRI brain shows mild Chairi I malformation without mass effect at the cervicomedullary junction.  She was to go MRI neuroaxis through Dr. Rubbie Battiest office, I am not sure if this was performed.  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 These findings  may reflect  a Chiari 1 malformation. However, cerebellar tonsillar  ectopia can also be seen in the setting of idiopathic intracranial  hypertension or intracranial hypotension. Unchanged crowding at the  level of the foramen magnum.   MRI brain wo contrast 11/18/2017: Mild Chiari malformation. Cerebellar tonsils 6 mm below the foramen magnum. Otherwise negative MRI head   Large cyst filling the left maxillary antrum.   Lab Results  Component Value Date   HGBA1C 5.6 11/14/2015   No results found for: VITAMINB12 Lab Results  Component Value Date   TSH 3.83 11/14/2015   No results found for: ESRSEDRATE, POCTSEDRATE  Past Medical History:  Diagnosis Date   Allergic rhinitis    Anxiety    Arnold-Chiari malformation, type I ([place])    Chickenpox    Chronic daily headache    Essential hypertension     Past Surgical History:  Procedure Laterality Date   None       Medications:  Outpatient Encounter Medications as of 12/04/2021  Medication Sig   acetaminophen (TYLENOL) 500 MG tablet Take 1 tablet (500 mg total) by mouth every 6 (six) hours as needed.   albuterol (VENTOLIN HFA) 108 (90 Base) MCG/ACT inhaler Inhale 2 puffs into the lungs every 6 (six) hours as needed for wheezing. Reported on 11/14/2015   amLODipine (NORVASC) 10 MG tablet Take 5 mg by mouth daily.   famotidine (PEPCID) 20 MG tablet Take 1 tablet by mouth as needed.   ferrous sulfate 325 (65 FE) MG tablet Take 1 tablet by  mouth every other day.   hydrOXYzine (VISTARIL) 50 MG capsule Take 50 mg by mouth 3 (three) times daily.   ibuprofen (ADVIL) 200 MG tablet Take 200 mg by mouth as needed.   naproxen sodium (ANAPROX) 550 MG tablet Take 550 mg by mouth 2 (two) times daily as needed.   norethindrone (AYGESTIN) 5 MG tablet Take 1 tablet by mouth every other day.   ondansetron (ZOFRAN-ODT) 4 MG disintegrating tablet as needed.   SUMAtriptan (IMITREX) 25 MG tablet Take 1 tablet by mouth as needed.   topiramate (TOPAMAX) 25 MG tablet  Take 1 tablet (25 mg total) by mouth at bedtime.   No facility-administered encounter medications on file as of 12/04/2021.    Allergies:  Allergies  Allergen Reactions   Hydromorphone Nausea And Vomiting and Other (See Comments)    Dizziness     Family History: Family History  Problem Relation Age of Onset   Hypertension Mother    COPD Mother    Bronchitis Mother    Alcoholism Father    Lung cancer Father     Social History: Social History   Tobacco Use   Smoking status: Some Days    Packs/day: 1.00    Years: 18.00    Pack years: 18.00    Types: Cigarettes    Last attempt to quit: 12/31/2015    Years since quitting: 5.9   Smokeless tobacco: Never   Tobacco comments:    Smokes about 15 cigarettes a day or one whole pack   Vaping Use   Vaping Use: Never used  Substance Use Topics   Alcohol use: Yes    Comment: Occasional Wine   Drug use: Yes    Types: Marijuana    Comment: occasionally   Social History   Social History Narrative   Lives with husband in a one story home.  Has one child.     Works as a Publishing copy.  Education: some college.      Right handed    Vital Signs:  BP (!) 140/91    Pulse 97    Ht 5\' 6"  (1.676 m)    Wt 183 lb (83 kg)    SpO2 99%    BMI 29.54 kg/m   Neurological Exam: MENTAL STATUS including orientation to time, place, person, recent and remote memory, language, and fund of knowledge is normal.  Psychomotor agitation. Pressured speech. Anxious appearing. Speech is not dysarthric.  CRANIAL NERVES: II:  No visual field defects.    III-IV-VI: Pupils equal round and reactive to light.  Normal conjugate, extra-ocular eye movements in all directions of gaze.  No nystagmus.  No ptosis.   V:  Normal facial sensation.    VII:  Normal facial symmetry and movements.   VIII:  Normal hearing and vestibular function.   IX-X:  Normal palatal movement.   XI:  Normal shoulder shrug and head rotation.   XII:  Normal tongue strength and  range of motion, no deviation or fasciculation.  MOTOR:  Motor strength is 5/5 throughout. No atrophy, fasciculations or abnormal movements.  No pronator drift.   MSRs:  Right        Left                  brachioradialis 2+  2+  biceps 2+  2+  triceps 2+  2+  patellar 2+  2+  ankle jerk 2+  2+  Hoffman no  no  plantar response down  down  SENSORY:  Normal and symmetric perception of light touch, pinprick, vibration, and proprioception.  Romberg's sign absent.   COORDINATION/GAIT: Normal finger-to- nose-finger.  Intact rapid alternating movements bilaterally. Gait narrow based and stable. Tandem and stressed gait intact.    IMPRESSION: Whole body pain and migratory dysesthesia due to anxiety/stress.  - NCS/EMG of the left arm and leg  - Follow-up with psychiatry  2.  Chiari I malformation with headaches  - Continue topiramate 25mg  daily, ok to titrate  - Headaches will not improve until her mental state has improved, high anxiety will only exacerbate all her symptoms  3.  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   Thank you for allowing me to participate in patient's care.  If I can answer any additional questions, I would be pleased to do so.    Sincerely,    Orvie Caradine K. Posey Pronto, DO

## 2021-12-09 ENCOUNTER — Ambulatory Visit: Payer: Self-pay | Admitting: Neurology

## 2021-12-15 ENCOUNTER — Other Ambulatory Visit: Payer: Self-pay | Admitting: *Deleted

## 2021-12-15 ENCOUNTER — Encounter: Payer: Self-pay | Admitting: Neurology

## 2021-12-15 ENCOUNTER — Encounter: Payer: Self-pay | Admitting: Cardiology

## 2021-12-15 DIAGNOSIS — G8929 Other chronic pain: Secondary | ICD-10-CM

## 2021-12-15 DIAGNOSIS — G935 Compression of brain: Secondary | ICD-10-CM

## 2021-12-15 DIAGNOSIS — R202 Paresthesia of skin: Secondary | ICD-10-CM

## 2021-12-15 MED ORDER — METOPROLOL SUCCINATE ER 25 MG PO TB24: 12.5000 mg | ORAL_TABLET | Freq: Every day | ORAL | 6 refills | Status: DC

## 2022-01-05 ENCOUNTER — Other Ambulatory Visit: Payer: Self-pay

## 2022-01-05 ENCOUNTER — Ambulatory Visit: Payer: BC Managed Care – PPO | Admitting: Neurology

## 2022-01-05 DIAGNOSIS — R52 Pain, unspecified: Secondary | ICD-10-CM

## 2022-01-05 DIAGNOSIS — R202 Paresthesia of skin: Secondary | ICD-10-CM

## 2022-01-05 NOTE — Procedures (Signed)
[facility]  [address], [address]  [place], [place] [address] Tel: [phone] Fax:  [phone] Test Date:  01/05/2022  Patient: Joann White DOB: 1976/03/30 Physician: Narda Amber, DO  Sex: Female Height: 5\' 6"  Ref Phys: Narda Amber, DO  ID#: [id number]   Technician:    Patient Complaints: This is a 46 year old female referred for evaluation of generalized paresthesias and pain involving the arms and legs.  NCV & EMG Findings: Extensive electrodiagnostic testing of the left upper and lower extremity shows:  All sensory responses including the left median, ulnar, mixed palmar, sural, and superficial peroneal nerves are within normal limits. All motor responses including the left median, ulnar, peroneal, and tibial motor responses are within normal limits. Tibial H reflex study is within normal limits. There is no evidence of active or chronic motor axonal loss changes affecting any of the tested muscles.  Motor unit configuration and recruitment pattern is within normal limits.  Impression: This is a normal study of the left upper and lower extremities.  In particular, there is no evidence of a cervical/lumbosacral radiculopathy, sensorimotor polyneuropathy, or carpal tunnel syndrome.   ___________________________ Narda Amber, DO    Nerve Conduction Studies Anti Sensory Summary Table   Stim Site NR Peak (ms) Norm Peak (ms) P-T Amp (V) Norm P-T Amp  Left Median Anti Sensory (2nd Digit)  32C  Wrist    2.6 <3.4 50.3 >20  Left Sup Peroneal Anti Sensory (Ant Lat Mall)  32C  12 cm    2.3 <4.5 15.6 >5  Left Sural Anti Sensory (Lat Mall)  32C  Calf    3.9 <4.5 23.7 >5  Left Ulnar Anti Sensory (5th Digit)  32C  Wrist    2.5 <3.1 47.0 >12   Motor Summary Table   Stim Site NR Onset (ms) Norm Onset (ms) O-P Amp (mV) Norm O-P Amp Site1 Site2 Delta-0 (ms) Dist (cm) Vel (m/s) Norm Vel (m/s)  Left Median Motor (Abd Poll Brev)  32C  Wrist    2.9 <3.9 9.4 >6  Elbow Wrist 4.3 28.0 65 >50  Elbow    7.2  9.4         Left Peroneal Motor (Ext Dig Brev)  32C  Ankle    5.0 <5.5 8.8 >3 B Fib Ankle 7.3 37.0 51 >40  B Fib    12.3  8.3  Poplt B Fib 1.2 7.0 58 >40  Poplt    13.5  8.2         Left Tibial Motor (Abd Hall Brev)  32C  Ankle    4.2 <6.0 19.2 >8 Knee Ankle 7.8 41.0 53 >40  Knee    12.0  16.6         Left Ulnar Motor (Abd Dig Minimi)  32C  Wrist    2.7 <3.1 9.2 >7 B Elbow Wrist 3.2 22.0 69 >50  B Elbow    5.9  9.0  A Elbow B Elbow 1.5 10.0 67 >50  A Elbow    7.4  8.3          Comparison Summary Table   Stim Site NR Peak (ms) Norm Peak (ms) P-T Amp (V) Site1 Site2 Delta-P (ms) Norm Delta (ms)  Left Median/Ulnar Palm Comparison (Wrist - 8cm)  32C  Median Palm    1.4 <2.2 117.1 Median Palm Ulnar Palm 0.1   Ulnar Palm    1.3 <2.2 33.5       H Reflex Studies   NR  H-Lat (ms) Lat Norm (ms) L-R H-Lat (ms)  Left Tibial (Gastroc)  32C     26.67 <35    EMG   Side Muscle Ins Act Fibs Psw Fasc Number Recrt Dur Dur. Amp Amp. Poly Poly. Comment  Left AntTibialis Nml Nml Nml Nml Nml Nml Nml Nml Nml Nml Nml Nml N/A  Left Gastroc Nml Nml Nml Nml Nml Nml Nml Nml Nml Nml Nml Nml N/A  Left Flex Dig Long Nml Nml Nml Nml Nml Nml Nml Nml Nml Nml Nml Nml N/A  Left RectFemoris Nml Nml Nml Nml Nml Nml Nml Nml Nml Nml Nml Nml N/A  Left GluteusMed Nml Nml Nml Nml Nml Nml Nml Nml Nml Nml Nml Nml N/A  Left 1stDorInt Nml Nml Nml Nml Nml Nml Nml Nml Nml Nml Nml Nml N/A  Left Ext Indicis Nml Nml Nml Nml Nml Nml Nml Nml Nml Nml Nml Nml N/A  Left PronatorTeres Nml Nml Nml Nml Nml Nml Nml Nml Nml Nml Nml Nml N/A  Left Biceps Nml Nml Nml Nml Nml Nml Nml Nml Nml Nml Nml Nml N/A  Left Triceps Nml Nml Nml Nml Nml Nml Nml Nml Nml Nml Nml Nml N/A  Left Deltoid Nml Nml Nml Nml Nml Nml Nml Nml Nml Nml Nml Nml N/A      Waveforms:

## 2022-01-08 ENCOUNTER — Other Ambulatory Visit: Payer: Self-pay | Admitting: Obstetrics and Gynecology

## 2022-01-11 ENCOUNTER — Encounter: Payer: Self-pay | Admitting: Neurology

## 2022-01-12 ENCOUNTER — Other Ambulatory Visit: Payer: Self-pay

## 2022-01-12 DIAGNOSIS — R202 Paresthesia of skin: Secondary | ICD-10-CM

## 2022-01-12 DIAGNOSIS — G935 Compression of brain: Secondary | ICD-10-CM

## 2022-01-12 DIAGNOSIS — G8929 Other chronic pain: Secondary | ICD-10-CM

## 2022-01-12 DIAGNOSIS — R52 Pain, unspecified: Secondary | ICD-10-CM

## 2022-01-12 MED ORDER — DIAZEPAM 5 MG PO TABS: ORAL_TABLET | ORAL | 0 refills | Status: DC

## 2022-01-13 NOTE — Addendum Note (Signed)
Addended by: Armen Pickup A on: 01/13/2022 11:40 AM ? ? Modules accepted: Orders ? ?

## 2022-01-14 NOTE — Addendum Note (Signed)
Addended by: Armen Pickup A on: 01/14/2022 08:01 AM ? ? Modules accepted: Orders ? ?

## 2022-01-15 ENCOUNTER — Telehealth: Payer: Self-pay | Admitting: Nurse Practitioner

## 2022-01-15 NOTE — Telephone Encounter (Signed)
Scheduled appt per 3/10 referral. Pt is aware of appt date and time. Pt is aware to arrive 15 mins prior to appt time and to bring and updated insurance card. Pt is aware of appt location.   ?

## 2022-01-19 ENCOUNTER — Encounter (HOSPITAL_BASED_OUTPATIENT_CLINIC_OR_DEPARTMENT_OTHER): Payer: Self-pay | Admitting: Obstetrics and Gynecology

## 2022-01-19 ENCOUNTER — Other Ambulatory Visit: Payer: Self-pay

## 2022-01-19 DIAGNOSIS — Z01818 Encounter for other preprocedural examination: Secondary | ICD-10-CM | POA: Diagnosis present

## 2022-01-19 NOTE — Progress Notes (Addendum)
Spoke w/ via phone for pre-op interview---Joann White ?Lab needs dos----serum pregnancy               ?Lab results------01/22/22 type & screen, 01/25/22 CBC, BMP, Ferritin & Iron binding ?01/25/22 Cxray, EKG in Epic & chart ?COVID test -----patient states asymptomatic no test needed ?Arrive at ------0530 on Monday, 02/08/22 ?NPO after MN NO Solid Food.  Clear liquids from MN until---0430 ?Med rec completed ?Medications to take morning of surgery -----Albuterol inhaler prn, Amlodipine, Pepcid, Metoprolol, Benadryl, norethindrone ?Diabetic medication -----n/a ?Patient instructed no nail polish to be worn day of surgery ?Patient instructed to bring photo id and insurance card day of surgery ?Patient aware to have Driver (ride ) / caregiver    for 24 hours after surgery - husband ?Patient Special Instructions -----Bring inhaler. Extended recovery instructions given. No smoking, alcohol or marijuana for 24 hours before surgery. ?Pre-Op special Istructions -----none ?Patient verbalized understanding of instructions that were given at this phone interview. ?Patient denies shortness of chest pain, fever, cough at this phone interview.  ? ?Patient states that she does get short of breath with exertion and experiences wheezing. She believes it is related to her COPD and heavy smoking. Her LOV with cardiology was 10/29/21, Dr. Lonia Mad in [place]. 11/18/2021 patient wore a heart monitor which showed rare PACs and PVCs and a limited episode of junctional tachycardia. Patient was prescribed Metoprolol with improvement of symptoms. Patient had a chest xray on 02/06/21 in [facility] that was negative for acute cardiopulmonary disease. ?Patient also endorses that she gets terrified at the thought of lying flat. Her BMI is 30.02. She also states that she has difficulty swallowing and feels like her throat is thick. ? ?Addendum: Patient's surgery was rescheduled from 01/26/22  to 02/08/2022. Patient went to the ED on 01/25/22 to be evaluated  for chest pain, headache and arm numbness (see ED provider note in Epic.) Per 01/25/22 ED provider note, patient presented with intermittent sharp pain in the left temple radiating down the neck and into the chest. Intermittent symptoms ongoing for over a week.Normal EKG, negative troponins, normal chest xray. 01/25/22 CT head in Epic. 01/25/22 CBC Hgb 11.1 and Platelets 575. ?Patient was instructed to follow-up with neurology and was given antibiotic for sinus infection. ?As of 01/26/22, pt stated that she is scheduled for an iron infusion on 01/29/22. No changes in medical history since last pre-op phone call on 01/19/22. Patient already came in for lab appt on 01/22/22 and has Hibiclens and instructions for surgery. ? ?

## 2022-01-19 NOTE — Progress Notes (Addendum)
?PLEASE WEAR A MASK OUT IN PUBLIC AND SOCIAL DISTANCE AND [place] YOUR HANDS FREQUENTLY. PLEASE ASK ALL YOUR CLOSE HOUSEHOLD CONTACT TO WEAR MASK OUT IN PUBLIC AND SOCIAL DISTANCE AND [place] HANDS FREQUENTLY ALSO. ? ? ? ? ? Your procedure is scheduled on Tuesday, 01/26/22.  ? Report to [facility] AT 5:30 am. ? ? Call this number if you have problems the morning of surgery  :[phone]. ? ? Joann White.  WE ARE LOCATED IN [facility]. ? ?PLEASE BRING YOUR INSURANCE CARD AND PHOTO ID DAY OF SURGERY. ? ?ONLY ONE PERSON ALLOWED IN FACILITY WAITING AREA. ?                                   ? REMEMBER: ? DO NOT EAT FOOD, CANDY GUM OR MINTS  AFTER MIDNIGHT THE NIGHT BEFORE YOUR SURGERY . YOU MAY HAVE CLEAR LIQUIDS FROM MIDNIGHT THE NIGHT BEFORE YOUR SURGERY UNTIL  4:30 am. NO CLEAR LIQUIDS AFTER  4:30 am DAY OF SURGERY. ? ? YOU MAY  BRUSH YOUR TEETH MORNING OF SURGERY AND RINSE YOUR MOUTH OUT, NO CHEWING GUM CANDY OR MINTS. ? ? ? ?CLEAR LIQUID DIET ? ? ?Foods Allowed                                                                     Foods Excluded ? ?Coffee and tea, regular and decaf                             liquids that you cannot  ?Plain Jell-O any favor except red or purple                                           see through such as: ?Fruit ices (not with fruit pulp)                                     milk, soups, orange juice  ?Iced Popsicles                                    All solid food ?Carbonated beverages, regular and diet                                    ?Cranberry, grape and apple juices ?Sports drinks like Gatorade ? ?Sample Menu ?Breakfast                                Lunch  Supper ?Cranberry juice                                           ?Jell-O                                     Grape juice                           Apple juice ?Coffee or tea                        Jell-O                                       Popsicle ?                                               Coffee or tea                        Coffee or tea ? ?_____________________________________________________________________ ?  ? ? TAKE THESE MEDICATIONS MORNING OF SURGERY WITH A SIP OF WATER:  Albuterol inhaler as needed, Amlodipine, Metoprolol, Benadryl, Norethindrone ? ?Please bring inhaler day of surgery. ? ?No smoking, alcohol or marijuana 24 hours before surgery. ? ?ONE VISITOR IS ALLOWED IN WAITING ROOM ONLY DAY OF SURGERY.  YOU MAY HAVE ANOTHER PERSON SWITCH OUT WITH THE  1  VISITOR IN THE WAITING ROOM DAY OF SURGERY AND A MASK MUST BE WORN IN THE WAITING ROOM.  ? ? 2 VISITORS  MAY VISIT IN THE EXTENDED RECOVERY ROOM UNTIL 800 PM ONLY 1 VISITOR AGE 82 AND OVER MAY SPEND THE NIGHT AND MUST BE IN EXTENDED RECOVERY ROOM NO LATER THAN 800 PM .  ? ? UP TO 2 CHILDREN AGE 42 TO 15 MAY ALSO VISIT IN EXTENDED RECOV ?ERY ROOM ONLY UNTIL 800 PM AND MUST LEAVE BY 800 PM.  ? ?ALL PERSONS VISITING IN EXTENDED RECOVERY ROOM MUST WEAR A MASK. ?                                   ?DO NOT WEAR JEWERLY, MAKE UP. ?DO NOT WEAR LOTIONS, POWDERS, PERFUMES OR NAIL POLISH ON YOUR FINGERNAILS. TOENAIL POLISH IS OK TO WEAR. ?DO NOT SHAVE FOR 48 HOURS PRIOR TO DAY OF SURGERY. ?MEN MAY SHAVE FACE AND NECK. ?CONTACTS, GLASSES, OR DENTURES MAY NOT BE WORN TO SURGERY. ?                                   ?[facility] IS NOT RESPONSIBLE  FOR ANY BELONGINGS.                                  ?                                  . ?  [place] - Preparing for Surgery ?Before surgery, you can play an important role.  Because skin is not sterile, your skin needs to be as free of germs as possible.  You can reduce the number of germs on your skin by washing with CHG (chlorahexidine gluconate) soap before surgery.  CHG is an antiseptic cleaner which kills germs and bonds with the skin to continue killing germs even after washing. ?Please DO NOT use if you have an allergy to CHG  or antibacterial soaps.  If your skin becomes reddened/irritated stop using the CHG and inform your nurse when you arrive at Short Stay. ?Do not shave (including legs and underarms) for at least 48 hours prior to the first CHG shower.  You may shave your face/neck. ?Please follow these instructions carefully: ? 1.  Shower with CHG Soap the night before surgery and the  morning of Surgery. ? 2.  If you choose to wash your hair, wash your hair first as usual with your  normal  shampoo. ? 3.  After you shampoo, rinse your hair and body thoroughly to remove the  shampoo.                            ?4.  Use CHG as you would any other liquid soap.  You can apply chg directly  to the skin and wash  ?                    Gently with a scrungie or clean washcloth. ? 5.  Apply the CHG Soap to your body ONLY FROM THE NECK DOWN.   Do not use on face/ open      ?                     Wound or open sores. Avoid contact with eyes, ears mouth and genitals (private parts).  ?                     Production manager,  Genitals (private parts) with your normal soap. ?            6.  Wash thoroughly, paying special attention to the area where your surgery  will be performed. ? 7.  Thoroughly rinse your body with warm water from the neck down. ? 8.  DO NOT shower/wash with your normal soap after using and rinsing off  the CHG Soap. ?               9.  Pat yourself dry with a clean towel. ?           10.  Wear clean pajamas. ?           11.  Place clean sheets on your bed the night of your first shower and do not  sleep with pets. ?Day of Surgery : ?Do not apply any lotions/deodorants the morning of surgery.  Please wear clean clothes to the hospital/surgery center. ? ?IF YOU HAVE ANY SKIN IRRITATION OR PROBLEMS WITH THE SURGICAL SOAP, PLEASE GET A BAR OF GOLD DIAL SOAP AND SHOWER THE NIGHT BEFORE YOUR SURGERY AND THE MORNING OF YOUR SURGERY. PLEASE LET THE NURSE KNOW MORNING OF YOUR SURGERY IF YOU HAD ANY PROBLEMS WITH THE SURGICAL  SOAP. ? ? ?________________________________________________________________________                  ?                                    ?  QUESTIONS [facility] PRE OP NURSE PHONE [phone]          ? ?Called pt on 01/26/22. Updated surgery date and time, and reviewed changes in visitor policy.                           ?

## 2022-01-20 ENCOUNTER — Telehealth: Payer: Self-pay | Admitting: Neurology

## 2022-01-20 NOTE — Telephone Encounter (Signed)
Patient called to follow up about her MRI, she still hasn't heard from anyone for scheduling. ?

## 2022-01-20 NOTE — Telephone Encounter (Signed)
Contacted patients [facility] and was informed that patients MRI was denied due to patient needing 6 weeks of physical therapy.  ?

## 2022-01-21 ENCOUNTER — Telehealth: Payer: Self-pay | Admitting: Neurology

## 2022-01-21 NOTE — Telephone Encounter (Signed)
Please let pt know that that MRI cannot be approved by her insurance, until she has completed 6 weeks of PT.  We can refer her for neck PT, if she is interested. ?

## 2022-01-21 NOTE — Telephone Encounter (Signed)
Called patient and informed her that her insurance has denied her MRI and required her complete 6 weeks of physical therapy. ? ?Patient was upset and stated that she needs this Mri and does not have the money to complete 6 weeks of physical therapy. Patient was frustrated because she states that she has pain all over and what is physical therapy going to even do? I informed her that they do exercises and strengthening to see if that may help her and that sometimes it actually does help patients. Patient was upset that her heal;th is in the mercy of her insurance. I informed patient that she may call her insurance and speak to them about this and patient said "no." Patient thanked me for the call and refused the referral.  ?

## 2022-01-21 NOTE — Telephone Encounter (Signed)
Called patient and left a detailed message per DPR that her MRI was denied due to needing 6 weeks of Physical Therapy. Informed patient to give me a call if she would like me to place the order.  ?

## 2022-01-21 NOTE — Telephone Encounter (Signed)
Pt is returning a call to someone about a referral ?

## 2022-01-21 NOTE — Telephone Encounter (Signed)
Noted  

## 2022-01-22 ENCOUNTER — Other Ambulatory Visit: Payer: Self-pay

## 2022-01-22 ENCOUNTER — Encounter (HOSPITAL_COMMUNITY)
Admission: RE | Admit: 2022-01-22 | Discharge: 2022-01-22 | Disposition: A | Discharge status: Home / Self Care | Payer: BC Managed Care – PPO | Source: Ambulatory Visit | Attending: Obstetrics and Gynecology | Admitting: Obstetrics and Gynecology

## 2022-01-22 DIAGNOSIS — Z01818 Encounter for other preprocedural examination: Secondary | ICD-10-CM | POA: Diagnosis not present

## 2022-01-22 LAB — CBC
HCT: 36.7 % (ref 36.0–46.0)
Hemoglobin: 11.9 g/dL — ABNORMAL LOW (ref 12.0–15.0)
MCH: 24.5 pg — ABNORMAL LOW (ref 26.0–34.0)
MCHC: 32.4 g/dL (ref 30.0–36.0)
MCV: 75.5 fL — ABNORMAL LOW (ref 80.0–100.0)
Platelets: 435 10*3/uL — ABNORMAL HIGH (ref 150–400)
RBC: 4.86 MIL/uL (ref 3.87–5.11)
RDW: 23.5 % — ABNORMAL HIGH (ref 11.5–15.5)
WBC: 10.8 10*3/uL — ABNORMAL HIGH (ref 4.0–10.5)
nRBC: 0 % (ref 0.0–0.2)

## 2022-01-22 LAB — BASIC METABOLIC PANEL
Anion gap: 10 (ref 5–15)
BUN: 9 mg/dL (ref 6–20)
CO2: 17 mmol/L — ABNORMAL LOW (ref 22–32)
Calcium: 9 mg/dL (ref 8.9–10.3)
Chloride: 110 mmol/L (ref 98–111)
Creatinine, Ser: 0.59 mg/dL (ref 0.44–1.00)
GFR, Estimated: >60 mL/min (ref >60)
Glucose, Bld: 84 mg/dL (ref 70–99)
Potassium: 3.7 mmol/L (ref 3.5–5.1)
Sodium: 137 mmol/L (ref 135–145)

## 2022-01-25 ENCOUNTER — Inpatient Hospital Stay: Payer: BC Managed Care – PPO

## 2022-01-25 ENCOUNTER — Emergency Department (HOSPITAL_COMMUNITY): Payer: BC Managed Care – PPO

## 2022-01-25 ENCOUNTER — Encounter (HOSPITAL_COMMUNITY): Payer: Self-pay | Admitting: Anesthesiology

## 2022-01-25 ENCOUNTER — Emergency Department (HOSPITAL_COMMUNITY)
Admission: EM | Admit: 2022-01-25 | Discharge: 2022-01-25 | Disposition: A | Discharge status: Home / Self Care | Payer: BC Managed Care – PPO | Attending: Emergency Medicine | Admitting: Emergency Medicine

## 2022-01-25 ENCOUNTER — Other Ambulatory Visit: Payer: Self-pay

## 2022-01-25 ENCOUNTER — Encounter (HOSPITAL_COMMUNITY): Payer: Self-pay

## 2022-01-25 ENCOUNTER — Encounter: Payer: Self-pay | Admitting: Nurse Practitioner

## 2022-01-25 ENCOUNTER — Inpatient Hospital Stay: Payer: BC Managed Care – PPO | Attending: Nurse Practitioner | Admitting: Nurse Practitioner

## 2022-01-25 VITALS — BP 141/97 | HR 104 | Temp 97.5°F | Resp 18 | Wt 187.8 lb

## 2022-01-25 DIAGNOSIS — N9489 Other specified conditions associated with female genital organs and menstrual cycle: Secondary | ICD-10-CM | POA: Diagnosis not present

## 2022-01-25 DIAGNOSIS — J449 Chronic obstructive pulmonary disease, unspecified: Secondary | ICD-10-CM | POA: Insufficient documentation

## 2022-01-25 DIAGNOSIS — E611 Iron deficiency: Secondary | ICD-10-CM | POA: Diagnosis not present

## 2022-01-25 DIAGNOSIS — R202 Paresthesia of skin: Secondary | ICD-10-CM | POA: Insufficient documentation

## 2022-01-25 DIAGNOSIS — I10 Essential (primary) hypertension: Secondary | ICD-10-CM | POA: Insufficient documentation

## 2022-01-25 DIAGNOSIS — R519 Headache, unspecified: Secondary | ICD-10-CM | POA: Diagnosis present

## 2022-01-25 DIAGNOSIS — N920 Excessive and frequent menstruation with regular cycle: Secondary | ICD-10-CM

## 2022-01-25 DIAGNOSIS — R072 Precordial pain: Secondary | ICD-10-CM | POA: Diagnosis not present

## 2022-01-25 DIAGNOSIS — D75839 Thrombocytosis, unspecified: Secondary | ICD-10-CM

## 2022-01-25 DIAGNOSIS — D259 Leiomyoma of uterus, unspecified: Secondary | ICD-10-CM

## 2022-01-25 DIAGNOSIS — D649 Anemia, unspecified: Secondary | ICD-10-CM

## 2022-01-25 DIAGNOSIS — Z79899 Other long term (current) drug therapy: Secondary | ICD-10-CM | POA: Insufficient documentation

## 2022-01-25 DIAGNOSIS — M79602 Pain in left arm: Secondary | ICD-10-CM | POA: Insufficient documentation

## 2022-01-25 DIAGNOSIS — Z809 Family history of malignant neoplasm, unspecified: Secondary | ICD-10-CM

## 2022-01-25 DIAGNOSIS — D500 Iron deficiency anemia secondary to blood loss (chronic): Secondary | ICD-10-CM

## 2022-01-25 DIAGNOSIS — Z801 Family history of malignant neoplasm of trachea, bronchus and lung: Secondary | ICD-10-CM

## 2022-01-25 DIAGNOSIS — M542 Cervicalgia: Secondary | ICD-10-CM

## 2022-01-25 DIAGNOSIS — Z803 Family history of malignant neoplasm of breast: Secondary | ICD-10-CM

## 2022-01-25 DIAGNOSIS — F419 Anxiety disorder, unspecified: Secondary | ICD-10-CM

## 2022-01-25 DIAGNOSIS — F17210 Nicotine dependence, cigarettes, uncomplicated: Secondary | ICD-10-CM

## 2022-01-25 LAB — CBC
HCT: 34.4 % — ABNORMAL LOW (ref 36.0–46.0)
Hemoglobin: 11.1 g/dL — ABNORMAL LOW (ref 12.0–15.0)
MCH: 24.5 pg — ABNORMAL LOW (ref 26.0–34.0)
MCHC: 32.3 g/dL (ref 30.0–36.0)
MCV: 75.9 fL — ABNORMAL LOW (ref 80.0–100.0)
Platelets: 575 10*3/uL — ABNORMAL HIGH (ref 150–400)
RBC: 4.53 MIL/uL (ref 3.87–5.11)
RDW: 23.1 % — ABNORMAL HIGH (ref 11.5–15.5)
WBC: 9.9 10*3/uL (ref 4.0–10.5)
nRBC: 0 % (ref 0.0–0.2)

## 2022-01-25 LAB — BASIC METABOLIC PANEL
Anion gap: 6 (ref 5–15)
BUN: 7 mg/dL (ref 6–20)
CO2: 22 mmol/L (ref 22–32)
Calcium: 8.7 mg/dL — ABNORMAL LOW (ref 8.9–10.3)
Chloride: 110 mmol/L (ref 98–111)
Creatinine, Ser: 0.65 mg/dL (ref 0.44–1.00)
GFR, Estimated: >60 mL/min (ref >60)
Glucose, Bld: 105 mg/dL — ABNORMAL HIGH (ref 70–99)
Potassium: 3.7 mmol/L (ref 3.5–5.1)
Sodium: 138 mmol/L (ref 135–145)

## 2022-01-25 LAB — CBC (CANCER CENTER ONLY)
HCT: 34.2 % — ABNORMAL LOW (ref 36.0–46.0)
Hemoglobin: 11.2 g/dL — ABNORMAL LOW (ref 12.0–15.0)
MCH: 24.5 pg — ABNORMAL LOW (ref 26.0–34.0)
MCHC: 32.7 g/dL (ref 30.0–36.0)
MCV: 74.7 fL — ABNORMAL LOW (ref 80.0–100.0)
Platelet Count: 587 10*3/uL — ABNORMAL HIGH (ref 150–400)
RBC: 4.58 MIL/uL (ref 3.87–5.11)
RDW: 22.6 % — ABNORMAL HIGH (ref 11.5–15.5)
WBC Count: 9.7 10*3/uL (ref 4.0–10.5)
nRBC: 0 % (ref 0.0–0.2)

## 2022-01-25 LAB — IRON AND IRON BINDING CAPACITY (CC-WL,HP ONLY)
Iron: 31 ug/dL (ref 28–170)
Saturation Ratios: 7 % — ABNORMAL LOW (ref 10.4–31.8)
TIBC: 451 ug/dL — ABNORMAL HIGH (ref 250–450)
UIBC: 420 ug/dL (ref 148–442)

## 2022-01-25 LAB — FERRITIN: Ferritin: 10 ng/mL — ABNORMAL LOW (ref 11–307)

## 2022-01-25 LAB — TROPONIN I (HIGH SENSITIVITY)
Troponin I (High Sensitivity): 5 ng/L (ref <18)
Troponin I (High Sensitivity): 4 ng/L (ref <18)

## 2022-01-25 LAB — HCG, QUANTITATIVE, PREGNANCY: hCG, Beta Chain, Quant, S: <1 m[IU]/mL (ref <5)

## 2022-01-25 MED ORDER — IOHEXOL 350 MG/ML SOLN
75.0000 mL | Freq: Once | INTRAVENOUS | Status: AC | PRN
  Administered 2022-01-25: 75 mL via INTRAVENOUS

## 2022-01-25 MED ORDER — SODIUM CHLORIDE (PF) 0.9 % IJ SOLN
INTRAMUSCULAR | Status: AC
  Filled 2022-01-25: qty 50

## 2022-01-25 MED ORDER — AMOXICILLIN-POT CLAVULANATE 875-125 MG PO TABS: 1.0000 | ORAL_TABLET | Freq: Two times a day (BID) | ORAL | 0 refills | Status: AC

## 2022-01-25 NOTE — ED Provider Notes (Signed)
? ?Emergency Department Provider Note ? ? ?I have reviewed the triage vital signs and the nursing notes. ? ? ?HISTORY ? ?Chief Complaint ?Chest Pain, Headache, and Arm numbness ? ? ?HPI ?Joann White is a 46 y.o. female with past medical history reviewed below presents to the emergency department for evaluation of intermittent sharp pain in the left temple radiating down the neck and into the chest.  Patient tells me that she has had intermittent symptoms over the past week.  Pain will come intervals with no clear provoking factor.  Denies any active pain.  She states in total the pain will be present for 2 to 3 seconds and then resolve.  She denies any vision change to me, including blurred vision, which she did mention during the MSE encounter.  States her vision has been normal.  No pain with extraocular movement.  She notes that her chest pain is the same sharp type pain which seems to originate in the head.  She has pain in the left arm as well.  She denies numbness or weakness in this location.  She did reports occasional tingling with pain episodes.  ? ? ?Past Medical History:  ?Diagnosis Date  ? Allergic rhinitis   ? Anemia   ? blood transfusion on 04/09/2020  ? Anxiety 2022  ? Arnold-Chiari malformation, type I ([place])   ? Bell's palsy 11/12/2017  ? Chickenpox   ? Chronic daily headache   ? COPD (chronic obstructive pulmonary disease) ([place])   ? mild  ? Depression 2022  ? Essential hypertension   ? History of blood transfusion 04/09/2020  ? Low back pain 2020  ? low back pain  ? Menorrhagia 2022  ? Palpitations 10/29/2021  ? Cardiologist Dr. Lonia Mad, [place] 10/29/21. Patient wore a Lygia Olaes term heart monitor that demonstrated rare PACs and PVCs and a limited episode of junctional tachycardia. No treatment required unless palpitations were bothersome. Patient prescribed Metoprol 25 mg 24 hour 1/2 tablet daily.  ? Paresthesia 11/2021  ? Patient saw neurologist, Dr. Narda Amber on 12/04/21 (see MD  note in  Epic) for parathesia and whole body pain. On 01/05/22 the patient had a NCW w/ EMG that was normal.  ? Sciatica 2020  ? chronic low back pain with sciatica  ? Uterine leiomyoma   ? ? ?Review of Systems ? ?Constitutional: No fever/chills ?Eyes: No visual changes. ?ENT: No sore throat. ?Cardiovascular: Denies chest pain. ?Respiratory: Denies shortness of breath. ?Gastrointestinal: No abdominal pain.  No nausea, no vomiting.  No diarrhea.  No constipation. ?Genitourinary: Negative for dysuria. ?Musculoskeletal: Negative for back pain. ?Skin: Negative for rash. ?Neurological: Positive HA.  ? ? ?____________________________________________ ? ? ?PHYSICAL EXAM: ? ?VITAL SIGNS: ?ED Triage Vitals  ?Enc Vitals Group  ?   BP 01/25/22 1341 (!) 148/102  ?   Pulse Rate 01/25/22 1341 83  ?   Resp 01/25/22 1341 18  ?   Temp 01/25/22 1341 98.7 ?F (37.1 ?C)  ?   Temp Source 01/25/22 1341 Oral  ?   SpO2 01/25/22 1341 100 %  ?   Weight 01/25/22 1341 186 lb (84.4 kg)  ?   Height 01/25/22 1341 '5\' 6"'$  (1.676 m)  ? ?Constitutional: Alert and oriented. Well appearing and in no acute distress. ?Eyes: Conjunctivae are normal. PERRL. EOMI. no appreciable papilledema on non-dilated exam. ?Head: Atraumatic. No tenderness to palpation of the temporal artery distribution.  ?Nose: No congestion/rhinnorhea. ?Mouth/Throat: Mucous membranes are moist.  Oropharynx non-erythematous. ?Neck: No stridor.   ?  Cardiovascular: Normal rate, regular rhythm. Good peripheral circulation. Grossly normal heart sounds.   ?Respiratory: Normal respiratory effort.  No retractions. Lungs CTAB. ?Gastrointestinal: Soft and nontender. No distention.  ?Musculoskeletal: No lower extremity tenderness nor edema. No gross deformities of extremities. ?Neurologic:  Normal speech and language. No gross focal neurologic deficits are appreciated. 5/5 strength in the bilateral upper and lower extremities with normal sensation in the bilateral upper and lower extremities.  No facial  asymmetry. ?Skin:  Skin is warm, dry and intact. No rash noted. ? ?____________________________________________ ?  ?LABS ?(all labs ordered are listed, but only abnormal results are displayed) ? ?Labs Reviewed  ?BASIC METABOLIC PANEL - Abnormal; Notable for the following components:  ?    Result Value  ? Glucose, Bld 105 (*)   ? Calcium 8.7 (*)   ? All other components within normal limits  ?CBC - Abnormal; Notable for the following components:  ? Hemoglobin 11.1 (*)   ? HCT 34.4 (*)   ? MCV 75.9 (*)   ? MCH 24.5 (*)   ? RDW 23.1 (*)   ? Platelets 575 (*)   ? All other components within normal limits  ?HCG, QUANTITATIVE, PREGNANCY  ?TROPONIN I (HIGH SENSITIVITY)  ?TROPONIN I (HIGH SENSITIVITY)  ? ?____________________________________________ ? ?EKG ? ? EKG Interpretation ? ?Date/Time:  Monday January 25 2022 13:40:08 EDT ?Ventricular Rate:  86 ?PR Interval:  138 ?QRS Duration: 82 ?QT Interval:  344 ?QTC Calculation: 411 ?R Axis:   49 ?Text Interpretation: Normal sinus rhythm Normal ECG When compared with ECG of 22-Jan-2022 09:36, PREVIOUS ECG IS PRESENT Confirmed by Nanda Quinton [phone]) on 01/25/2022 5:44:15 PM ?  ? ?  ? ? ?____________________________________________ ? ?RADIOLOGY ? ?CT Angio Head W or Wo Contrast ? ?Result Date: 01/25/2022 ?CLINICAL DATA:  Headache, sudden, severe. Sudden onset headache radiating to left neck. Left-sided head pain which radiates to left neck and left chest. Left arm numbness. Symptoms began 1 week ago. EXAM: CT ANGIOGRAPHY HEAD AND NECK TECHNIQUE: Multidetector CT imaging of the head and neck was performed using the standard protocol during bolus administration of intravenous contrast. Multiplanar CT image reconstructions and MIPs were obtained to evaluate the vascular anatomy. Carotid stenosis measurements (when applicable) are obtained utilizing NASCET criteria, using the distal internal carotid diameter as the denominator. RADIATION DOSE REDUCTION: This exam was performed according  to the departmental dose-optimization program which includes automated exposure control, adjustment of the mA and/or kV according to patient size and/or use of iterative reconstruction technique. CONTRAST:  11m OMNIPAQUE IOHEXOL 350 MG/ML SOLN COMPARISON:  Prior head CT examinations 01/20/2021 and earlier. Brain MRI 07/14/2021. FINDINGS: CT HEAD FINDING: Brain: Cerebral volume is normal. 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 Associated crowding at the level foramen magnum. Partially empty sella turcica. There is no acute intracranial hemorrhage. No demarcated cortical infarct. No extra-axial fluid collection. No evidence of an intracranial mass. No midline shift. Vascular: No hyperdense vessel. Skull: Normal. Negative for fracture or focal lesion. Sinuses: Complete opacification of the left maxillary sinus. Irregular hyperdensity within the left maxillary sinus may reflect inspissated secretions or sequela of chronic fungal sinusitis. Mild scattered fluid within the left ethmoid air cells. Orbits: No orbital mass or acute orbital finding. Review of the MIP images confirms the above findings CTA NECK FINDINGS Aortic arch: Standard aortic branching. The visualized aortic arch is normal in caliber. Streak and beam hardening artifact  arising from a dense left-sided contrast bolus partially obscures the left subclavian artery. Within this limitation, there is no appreciable hemodynamically significant innominate or proximal subclavian artery stenosis. Right carotid system: CCA and ICA patent within the neck without stenosis or significant atherosclerotic disease. Left carotid system: CCA and ICA patent within the neck without stenosis or significant atherosclerotic disease. Vertebral arteries: Vertebral arteries codominant and patent within the neck without stenosis or significant atherosclerotic disease. Skeleton:  Cervical spondylosis. No acute bony abnormality or aggressive osseous lesion. Other neck: No neck mass or cervical lymphadenopathy. Subcentimeter nodule within the right thyroid lobe, not meeting consensus criter

## 2022-01-25 NOTE — ED Triage Notes (Signed)
Pt c/o left sided head pain that radiates to left neck to left chest. Pt also c/o left arm numbness. All sx started about a week ago.  ? ?8/10 pain  ? ?A/ox4 ?Ambulatory in triage  ?

## 2022-01-25 NOTE — Progress Notes (Addendum)
?[facility]   ?[phone] [phone]   ?Clinic New consult Note  ? ?Patient Care Team: ?La Verkin Nation, MD as PCP - General (Internal Medicine) ?Satira Sark, MD as PCP - Cardiology (Cardiology) ?Alda Berthold, DO as Consulting Physician (Neurology) ?01/25/2022 ? ?CHIEF COMPLAINTS/PURPOSE OF CONSULTATION:  ?Thrombocytosis, referred by Dr. Tiana Loft ? ?HISTORY OF PRESENTING ILLNESS:  ?Joann White 46 y.o. female with Chiari I malformation, HTN, tobacco use, uterine fibroids, and iron deficiency secondary to menorrhagia is here because of thrombocytosis. First available CBC 05/12/2011 showed normal hemoglobin 13.0 and elevated platelet count 491K.  Platelet count was normal 02/14/2012, however later in the month she had mild anemia Hgb 11.7 with mild thrombocytosis platelets 417K.  CBC 11/14/2015 showed Hgb 11.5 with normal platelet count 331K.  She had thrombocytosis with platelet 558K on 03/25/2017 when hemoglobin was 11.0.  She developed severe anemia 04/08/2020 hemoglobin 6.4, MCV 65.3, and platelets elevated 442K.  She received a blood transfusion.  In 01/2021 her hemoglobin remained 7.6-8.2 with persistent thrombocytosis platelet up to 508K.  She is on oral iron once daily for 2 years, takes it intermittently due to intolerance from GI upset/constipation. Her bleeding has worsened lately, now constant with cramping.  She is planning to undergo hysterectomy tomorrow, referred to [name] for platelet count of 622 (per 01/14/22 MD note) which has been increasing though her anemia has been improving and she is having less bleeding.  I do not have any available iron studies to review. Most recent CBC 01/22/22 shows Hgb 11.9, plt 435K, and WBC 10.8. She notes having a recent sinus infection, could not get to PCP so antibiotics were not prescribed. She took an old prescription of prednisone and continues to have drainage which causes nausea.  ? ?Socially, she is married with 1  healthy 59 year old son.  She is not working outside the home.  She is independent with ADLs.  Drinks alcohol rarely.  Smokes 1 pack/day x 25 years and occasional marijuana use a few times a month.  Patient is up-to-date on mammogram and Pap smear, has never had a colonoscopy.  Her mother has IDA.  Father passed away from lung cancer (NSCLC) in 2020.  A paternal aunt had an aggressive form of cancer, and another paternal aunt had breast cancer. ? ?Today, she presents by herself.  She has daily vaginal bleeding.  She has low energy and cold intolerance.  Previously she had palpitations now managed with metoprolol per cardiology.  In the last week or so she has new onset frontal headache that shoots down her neck into the chest and left arm.  This occurs intermittently, does not know what triggers it.  She has neck stiffness.  Vision is blurry at baseline but worsening.  She has paresthesias in her limbs.  She has cough from smoking.  She denies fall, chest pain.  She is worried and anxious, thinks she might go to the ED after this visit to be evaluated prior to surgery. ? ? ? ?MEDICAL HISTORY:  ?Past Medical History:  ?Diagnosis Date  ? Allergic rhinitis   ? Anemia   ? blood transfusion on 04/09/2020  ? Anxiety 2022  ? Arnold-Chiari malformation, type I ([place])   ? Bell's palsy 11/12/2017  ? Chickenpox   ? Chronic daily headache   ? COPD (chronic obstructive pulmonary disease) ([place])   ? mild  ? Depression 2022  ? Essential hypertension   ? History of blood transfusion 04/09/2020  ?  Low back pain 2020  ? low back pain  ? Menorrhagia 2022  ? Palpitations 10/29/2021  ? Cardiologist Dr. Lonia Mad, [place] 10/29/21. Patient wore a long term heart monitor that demonstrated rare PACs and PVCs and a limited episode of junctional tachycardia. No treatment required unless palpitations were bothersome. Patient prescribed Metoprol 25 mg 24 hour 1/2 tablet daily.  ? Paresthesia 11/2021  ? Patient saw neurologist, Dr. Narda Amber on  12/04/21 (see MD  note in Epic) for parathesia and whole body pain. On 01/05/22 the patient had a NCW w/ EMG that was normal.  ? Sciatica 2020  ? chronic low back pain with sciatica  ? Uterine leiomyoma   ? ? ?SURGICAL HISTORY: ?Past Surgical History:  ?Procedure Laterality Date  ? BRONCHOSCOPY    ? around 2009 per pt, pt had right-sided chest pain, pt stated results were normal  ? ? ?SOCIAL HISTORY: ?Social History  ? ?Socioeconomic History  ? Marital status: Married  ?  Spouse name: Not on file  ? Number of children: 1  ? Years of education: Not on file  ? Highest education level: Not on file  ?Occupational History  ? Not on file  ?Tobacco Use  ? Smoking status: Every Day  ?  Packs/day: 1.00  ?  Years: 25.00  ?  Pack years: 25.00  ?  Types: Cigarettes  ? Smokeless tobacco: Never  ?Vaping Use  ? Vaping Use: Never used  ?Substance and Sexual Activity  ? Alcohol use: Yes  ?  Comment: Occasional Wine, less than one drink per month  ? Drug use: Yes  ?  Types: Marijuana  ?  Comment: few times per month  ? Sexual activity: Not on file  ?Other Topics Concern  ? Not on file  ?Social History Narrative  ? Lives with husband in a one story home.  Has one child.    ? Works as a Publishing copy.  Education: some college.  ?   ? Right handed  ? ?Social Determinants of Health  ? ?Financial Resource Strain: Not on file  ?Food Insecurity: Not on file  ?Transportation Needs: Not on file  ?Physical Activity: Not on file  ?Stress: Not on file  ?Social Connections: Not on file  ?Intimate Partner Violence: Not on file  ? ? ?FAMILY HISTORY: ?Family History  ?Problem Relation Age of Onset  ? Hypertension Mother   ? COPD Mother   ? Bronchitis Mother   ? Cancer Father   ? Alcoholism Father   ? Lung cancer Father   ? Cancer Paternal Aunt   ?     unknow type  ? Cancer Paternal Aunt   ?     breast  ? ? ?ALLERGIES:  is allergic to hydromorphone. ? ?MEDICATIONS:  ?Current Outpatient Medications  ?Medication Sig Dispense Refill  ?  acetaminophen (TYLENOL) 500 MG tablet Take 500 mg by mouth every 6 (six) hours as needed.    ? albuterol (VENTOLIN HFA) 108 (90 Base) MCG/ACT inhaler Inhale 2 puffs into the lungs every 6 (six) hours as needed for wheezing. Reported on 11/14/2015 18 g 0  ? amLODipine (NORVASC) 10 MG tablet Take 5 mg by mouth daily.    ? aspirin EC 81 MG tablet Take 81 mg by mouth daily.    ? diazepam (VALIUM) 5 MG tablet Take 30-min prior to MRI.  Do not drive while on this medication. (Patient not taking: Reported on 01/25/2022) 1 tablet 0  ? diphenhydrAMINE (BENADRYL) 25  mg capsule Take 50 mg by mouth every 6 (six) hours as needed.    ? famotidine (PEPCID) 20 MG tablet Take 1 tablet by mouth as needed.    ? ferrous sulfate 325 (65 FE) MG tablet Take 1 tablet by mouth every other day. Patient takes about 2 or 3 times per week due to GI upset.    ? metoprolol succinate (TOPROL XL) 25 MG 24 hr tablet Take 0.5 tablets (12.5 mg total) by mouth daily. 15 tablet 6  ? naproxen sodium (ANAPROX) 550 MG tablet Take 550 mg by mouth 2 (two) times daily as needed.    ? norethindrone (AYGESTIN) 5 MG tablet Take 1 tablet by mouth daily. Takes 3 tablets at one time daily when bleeding is heavy. When bleeding slows down she takes 1 tablet 3 times daily.    ? ondansetron (ZOFRAN-ODT) 4 MG disintegrating tablet as needed.    ? predniSONE (DELTASONE) 20 MG tablet Take 20 mg by mouth daily with breakfast. Took leftover prednisone for a sinus infection for about a week. Patient stopped on 01/13/22 per pt.    ? SUMAtriptan (IMITREX) 25 MG tablet Take 1 tablet by mouth as needed.    ? topiramate (TOPAMAX) 25 MG tablet Take 1 tablet (25 mg total) by mouth at bedtime. 30 tablet 0  ? ?No current facility-administered medications for this visit.  ? ? ?REVIEW OF SYSTEMS:   ?Constitutional: Denies fevers, chills or abnormal night sweats (+) fatigue (+) cold intolerance  ?Eyes: Denies double vision or watery eyes (+) blurry vision ?Ears, nose, mouth, throat, and  face: Denies mucositis or sore throat (+) difficulty swallowing ?Respiratory: Denies cough, dyspnea or wheezes (+) cough (+) smoker  ?Cardiovascular: Denies palpitation, chest discomfort or lower extremity swelling (+

## 2022-01-25 NOTE — Anesthesia Preprocedure Evaluation (Deleted)
Anesthesia Evaluation  ? ? ?Reviewed: ?Allergy & Precautions, Patient's Chart, lab work & pertinent test results, reviewed documented beta blocker date and time  ? ?Airway ? ? ? ? ? ? ? Dental ?  ?Pulmonary ?COPD,  COPD inhaler, Current Smoker,  ?Current smoker, 25 pack year history  ?  ? ? ? ? ? ? ? Cardiovascular ?hypertension, Pt. on medications and Pt. on home beta blockers ? ? ? ?  ?Neuro/Psych ? Headaches, PSYCHIATRIC DISORDERS Anxiety Depression   ? GI/Hepatic ?negative GI ROS, (+)  ?  ?  ? alcohol use and marijuana use,   ?Endo/Other  ?negative endocrine ROS ? Renal/GU ?negative Renal ROS  ?Female GU complaint ?Menorrhagia, anemia, fibroids ? ?  ?Musculoskeletal ?Chronic back pain ? ?saw neurologist, Dr. Narda Amber on 12/04/21 (see MD  note in Epic) for parathesia and whole body pain. On 01/05/22 the patient had a NCW w/ EMG   ? Abdominal ?  ?Peds ? Hematology ? ?(+) Blood dyscrasia, anemia , Hb 11.1, plt 575   ?Anesthesia Other Findings ?Chronic prednisone-  ? Reproductive/Obstetrics ?negative OB ROS ? ?  ? ? ? ? ? ? ? ? ? ? ? ? ? ?  ?  ? ? ? ? ? ? ? ? ?Anesthesia Physical ?Anesthesia Plan ? ?ASA: 2 ? ?Anesthesia Plan: General  ? ?Post-op Pain Management: Tylenol PO (pre-op)*, Toradol IV (intra-op)* and Ketamine IV*  ? ?Induction: Intravenous ? ?PONV Risk Score and Plan: 3 and Ondansetron, Dexamethasone, Midazolam and Treatment may vary due to age or medical condition ? ?Airway Management Planned: Oral ETT ? ?Additional Equipment: None ? ?Intra-op Plan:  ? ?Post-operative Plan: Extubation in OR ? ?Informed Consent:  ? ?Plan Discussed with:  ? ?Anesthesia Plan Comments:   ? ? ? ? ? ? ?Anesthesia Quick Evaluation ? ?

## 2022-01-25 NOTE — Discharge Instructions (Signed)

## 2022-01-25 NOTE — ED Notes (Signed)
Patient refused final temperature check. ? ?

## 2022-01-25 NOTE — ED Provider Triage Note (Signed)
Emergency Medicine Provider Triage Evaluation Note ? ?Joann White , a 46 y.o. female  was evaluated in triage.  Pt complains of sudden onset left-sided head pain that radiates down to her neck and left side of her chest.  Patient states that she was just sitting at home when it suddenly started and has been going on for about 1 week.  She also endorses some blurred vision and paresthesias down the left arm.  Patient is scheduled for a hysterectomy tomorrow for 3 severe iron deficiency anemia.  She saw her oncologist today where she noted her symptoms and was referred here to the emergency department. ? ?Review of Systems  ?Positive: Unilateral, sudden onset left headache, blurred vision, paresthesias ?Negative:  ? ?Physical Exam  ?BP (!) 148/102 (BP Location: Left Arm)   Pulse 83   Temp 98.7 ?F (37.1 ?C) (Oral)   Resp 18   Ht '5\' 6"'$  (1.676 m)   Wt 84.4 kg   LMP  (LMP Unknown)   SpO2 100%   BMI 30.02 kg/m?  ?Gen:   Awake, no distress  ?Resp:  Normal effort  ?MSK:   Moves extremities without difficulty  ?Other:  Eyes PERRL ? ?Medical Decision Making  ?Medically screening exam initiated at 2:40 PM.  Appropriate orders placed.  Joann White was informed that the remainder of the evaluation will be completed by another provider, this initial triage assessment does not replace that evaluation, and the importance of remaining in the ED until their evaluation is complete. ? ? ?  ?Tonye Pearson, [place] ?01/25/22 1442 ? ?

## 2022-01-26 ENCOUNTER — Encounter (HOSPITAL_BASED_OUTPATIENT_CLINIC_OR_DEPARTMENT_OTHER): Payer: Self-pay | Admitting: Obstetrics and Gynecology

## 2022-01-26 ENCOUNTER — Other Ambulatory Visit: Payer: Self-pay | Admitting: Nurse Practitioner

## 2022-01-26 ENCOUNTER — Telehealth: Payer: Self-pay | Admitting: Neurology

## 2022-01-26 ENCOUNTER — Telehealth: Payer: Self-pay | Admitting: Nurse Practitioner

## 2022-01-26 ENCOUNTER — Other Ambulatory Visit: Payer: Self-pay

## 2022-01-26 ENCOUNTER — Encounter: Payer: Self-pay | Admitting: Nurse Practitioner

## 2022-01-26 LAB — TYPE AND SCREEN
ABO/RH(D): A POS
Antibody Screen: NEGATIVE

## 2022-01-26 NOTE — Telephone Encounter (Signed)
Scheduled follow-up appointments per 3/20 los. Patient is aware. ?

## 2022-01-26 NOTE — Telephone Encounter (Signed)
Patient was in the ER. She was told she needs to follow up with her neurologist. Stated they mentioned a tumor and spinal fluid. She would like to be seen to figure out if patel can do anything to help her.  ?

## 2022-01-27 ENCOUNTER — Encounter: Payer: Self-pay | Admitting: Nurse Practitioner

## 2022-01-27 NOTE — Telephone Encounter (Signed)
Please let pt know that I have reviewed her imaging. I recommend that she first see an eye doctor to have dilated eye exam to determine if there is increased pressure behind the eye.  With Chiari, spinal fluid testing is not very safe way to check her pressure.  Once she has seen her eye doctor, please ask them to fax their notes to our office and we can determine the next step.  In the meantime, we can increase her topiramate to '50mg'$  daily to help with controlling her headaches.  OK to send new Rx, if she is agreeable.  ?

## 2022-01-27 NOTE — Progress Notes (Addendum)
? ?Referring:  ?Long, Wonda Olds, MD ?[address] ?[place],  [place] [address] ? ?PCP: ?Grambling Nation, MD ? ?Neurology was asked to evaluate Joann White, a 46 year old female for a chief complaint of headaches.  Our recommendations of care will be communicated by shared medical record.   ? ?CC:  headaches ? ?History provided from self ? ?HPI:  ?Medical co-morbidities: Bells palsy (left) 2018, Chiari malformation ? ?The patient presents for evaluation of headaches. In 2018 she developed left sided Bell's palsy. After this she developed a muscle knot on her left side which caused shooting pain. Pain has been getting progressively worse since then. Recently she has developed shooting pain down her face and the left side of her neck. This can also radiate behind the ears. Can feel like it is hot or burning. It lasts 3-4 seconds at a time. This is associated with neck stiffness. She also has headaches in the top and back of her head which are associated with photophobia, phonophobia, and nausea.  ? ?She has a history of Chiari malformation which was diagnosed in 2017. She saw neurosurgery who offered decompression, but she was hesitant to undergo it at that time. She was started on Topamax 25 mg QHS in November 2022 for headaches which does seem to help. Takes Imitrex 25 mg PRN which does help for a few hours, but pain will often return.  ? ?Went to the ED for headaches on 01/25/22 where [facility] re-demonstrated Chiari malformation and showed a partially empty sella. It also showed severe left maxillary sinusitis. CTA head/neck was unremarkable. MRI C-spine is ordered, but insurance would not cover this unless she did 6 weeks of physical therapy. She was referred to physical therapy, but hasn't started it as she is unable to afford it. ? ?Feels her vision has gotten blurrier over the past year. Last time she saw an eye doctor was October 2022. ? ?She also endorses paresthesias in her hands and feet. Cannot feel hot water in  the shower over her left shoulder. Had a normal EMG earlier this year. ? ?Headache History: ?Onset: 2018 ?Triggers: turning head quickly, leaning forward, coughing, sneezing ?Aura: no ?Location: vertex, occiput, face ?Quality/Description: stabbing, shooting ?Associated Symptoms: ? Photophobia: yes ? Phonophobia: yes ? Nausea: yes ?Vomiting: no ?Worse with activity?: yes ? ?Headache days per month: 30 ?Headache free days per month: 0 ? ?Current Treatment: ?Abortive ?Imitrex 25 mg PRN ? ?Preventative ?Topamax 25 mg QHS ? ?Prior Therapies                                 ?Topamax 25 mg QHS ?Metoprolol 25 mg daily ?Cymbalta 30 mg daily ?Imitrex 25 mg PRN ? ?LABS: ?CBC ?   ?Component Value Date/Time  ? WBC 9.9 01/25/2022 1437  ? WBC 9.7 01/25/2022 1230  ? RBC 4.53 01/25/2022 1437  ? HGB 11.1 (L) 01/25/2022 1437  ? HGB 11.2 (L) 01/25/2022 1230  ? HGB 11.4 05/04/2017 1557  ? HCT 34.4 (L) 01/25/2022 1437  ? HCT 35.0 05/04/2017 1557  ? PLT 575 (H) 01/25/2022 1437  ? PLT 587 (H) 01/25/2022 1230  ? PLT 262 05/04/2017 1557  ? MCV 75.9 (L) 01/25/2022 1437  ? MCV 75 (L) 05/04/2017 1557  ? MCV 80 04/19/2013 0803  ? MCH 24.5 (L) 01/25/2022 1437  ? MCHC 32.3 01/25/2022 1437  ? RDW 23.1 (H) 01/25/2022 1437  ? RDW 19.5 (H)  05/04/2017 1557  ? RDW 15.7 (H) 04/19/2013 0803  ? LYMPHSABS 1.8 01/19/2021 2237  ? MONOABS 1.0 01/19/2021 2237  ? EOSABS 0.1 01/19/2021 2237  ? BASOSABS 0.0 01/19/2021 2237  ? ? ?  Latest Ref Rng & Units 01/25/2022  ?  2:37 PM 01/22/2022  ?  9:42 AM 01/19/2021  ? 10:37 PM  ?CMP  ?Glucose 70 - 99 mg/dL 105   84   90    ?BUN 6 - 20 mg/dL '7   9   5    '$ ?Creatinine 0.44 - 1.00 mg/dL 0.65   0.59   0.71    ?Sodium 135 - 145 mmol/L 138   137   137    ?Potassium 3.5 - 5.1 mmol/L 3.7   3.7   3.5    ?Chloride 98 - 111 mmol/L 110   110   104    ?CO2 22 - 32 mmol/L '22   17   26    '$ ?Calcium 8.9 - 10.3 mg/dL 8.7   9.0   9.0    ? ? ? ?IMAGING:  ?CTA head/neck 01/25/22: no significant stenosis ? ?Gantt 01/25/22: empty sella, stable Chiari  malformation, chronic sinusitis ? ?MRI brain 11/18/2017:  ?Mild Chiari malformation. Cerebellar tonsils 6 mm below the foramen ?magnum. Otherwise negative MRI head ?  ?Large cyst filling the left maxillary antrum. ? ?Imaging independently reviewed on January 28, 2022  ? ?Current Outpatient Medications on File Prior to Visit  ?Medication Sig Dispense Refill  ? acetaminophen (TYLENOL) 500 MG tablet Take 500 mg by mouth every 6 (six) hours as needed.    ? albuterol (VENTOLIN HFA) 108 (90 Base) MCG/ACT inhaler Inhale 2 puffs into the lungs every 6 (six) hours as needed for wheezing. Reported on 11/14/2015 18 g 0  ? amLODipine (NORVASC) 10 MG tablet Take 5 mg by mouth daily.    ? amoxicillin-clavulanate (AUGMENTIN) 875-125 MG tablet Take 1 tablet by mouth every 12 (twelve) hours for 7 days. 14 tablet 0  ? aspirin EC 81 MG tablet Take 81 mg by mouth daily.    ? diphenhydrAMINE (BENADRYL) 25 mg capsule Take 50 mg by mouth every 6 (six) hours as needed.    ? famotidine (PEPCID) 20 MG tablet Take 1 tablet by mouth as needed.    ? ferrous sulfate 325 (65 FE) MG tablet Take 1 tablet by mouth every other day. Patient takes about 2 or 3 times per week due to GI upset.    ? metoprolol succinate (TOPROL XL) 25 MG 24 hr tablet Take 0.5 tablets (12.5 mg total) by mouth daily. 15 tablet 6  ? naproxen sodium (ANAPROX) 550 MG tablet Take 550 mg by mouth 2 (two) times daily as needed.    ? norethindrone (AYGESTIN) 5 MG tablet Take 1 tablet by mouth daily. Takes 3 tablets at one time daily when bleeding is heavy. When bleeding slows down she takes 1 tablet 3 times daily.    ? ondansetron (ZOFRAN-ODT) 4 MG disintegrating tablet as needed.    ? diazepam (VALIUM) 5 MG tablet Take 30-min prior to MRI.  Do not drive while on this medication. (Patient not taking: Reported on 01/25/2022) 1 tablet 0  ? ?No current facility-administered medications on file prior to visit.  ? ? ? ?Allergies: ?Allergies  ?Allergen Reactions  ? Hydromorphone Nausea And  Vomiting and Other (See Comments)  ?  Dizziness   ? ? ?Family History: ?Migraine or other headaches in the family:  no ?Aneurysms  in a first degree relative:  no ?Brain tumors in the family:  no ?Other neurological illness in the family:   no ? ?Past Medical History: ?Past Medical History:  ?Diagnosis Date  ? Allergic rhinitis   ? Anemia   ? blood transfusion on 04/09/2020  ? Anxiety 2022  ? Arnold-Chiari malformation, type I ([place])   ? Bell's palsy 11/12/2017  ? Chickenpox   ? Chronic daily headache   ? COPD (chronic obstructive pulmonary disease) ([place])   ? mild  ? Depression 2022  ? Essential hypertension   ? History of blood transfusion 04/09/2020  ? Low back pain 2020  ? low back pain  ? Menorrhagia 2022  ? Palpitations 10/29/2021  ? Cardiologist Dr. Lonia Mad, [place] 10/29/21. Patient wore a long term heart monitor that demonstrated rare PACs and PVCs and a limited episode of junctional tachycardia. No treatment required unless palpitations were bothersome. Patient prescribed Metoprol 25 mg 24 hour 1/2 tablet daily.  ? Paresthesia 11/2021  ? Patient saw neurologist, Dr. Narda Amber on 12/04/21 (see MD  note in Epic) for parathesia and whole body pain. On 01/05/22 the patient had a NCW w/ EMG that was normal.  ? Sciatica 2020  ? chronic low back pain with sciatica  ? Sickle cell trait ([place])   ? Uterine leiomyoma   ? ? ?Past Surgical History ?Past Surgical History:  ?Procedure Laterality Date  ? BRONCHOSCOPY    ? around 2009 per pt, pt had right-sided chest pain, pt stated results were normal  ? ? ?Social History: ?Social History  ? ?Tobacco Use  ? Smoking status: Every Day  ?  Packs/day: 1.00  ?  Years: 25.00  ?  Pack years: 25.00  ?  Types: Cigarettes  ? Smokeless tobacco: Never  ? Tobacco comments:  ?  01/28/22 a little less than 1 PPD  ?Vaping Use  ? Vaping Use: Never used  ?Substance Use Topics  ? Alcohol use: Yes  ?  Comment: Occasional Wine, less than one drink per month  ? Drug use: Yes  ?  Types: Marijuana   ?  Comment: few times per month  ? ? ?ROS: ?Negative for fevers, chills. Positive for headaches, paresthesias. All other systems reviewed and negative unless stated otherwise in HPI. ? ? ?Physical Exa

## 2022-01-28 ENCOUNTER — Telehealth: Payer: Self-pay | Admitting: Nurse Practitioner

## 2022-01-28 ENCOUNTER — Encounter: Payer: Self-pay | Admitting: Psychiatry

## 2022-01-28 ENCOUNTER — Ambulatory Visit: Payer: BC Managed Care – PPO | Admitting: Psychiatry

## 2022-01-28 ENCOUNTER — Other Ambulatory Visit: Payer: Self-pay | Admitting: Nurse Practitioner

## 2022-01-28 ENCOUNTER — Encounter: Payer: Self-pay | Admitting: Nurse Practitioner

## 2022-01-28 VITALS — BP 121/85 | HR 96 | Ht 66.0 in | Wt 185.0 lb

## 2022-01-28 DIAGNOSIS — G935 Compression of brain: Secondary | ICD-10-CM

## 2022-01-28 DIAGNOSIS — G43009 Migraine without aura, not intractable, without status migrainosus: Secondary | ICD-10-CM

## 2022-01-28 DIAGNOSIS — M5481 Occipital neuralgia: Secondary | ICD-10-CM | POA: Diagnosis not present

## 2022-01-28 MED ORDER — TOPIRAMATE 25 MG PO TABS: ORAL_TABLET | ORAL | 3 refills | Status: DC

## 2022-01-28 MED ORDER — TIZANIDINE HCL 2 MG PO TABS: 2.0000 mg | ORAL_TABLET | Freq: Four times a day (QID) | ORAL | 3 refills | Status: DC | PRN

## 2022-01-28 MED ORDER — SUMATRIPTAN SUCCINATE 50 MG PO TABS: 50.0000 mg | ORAL_TABLET | ORAL | 3 refills | Status: DC | PRN

## 2022-01-28 NOTE — Patient Instructions (Addendum)
Please set up an appointment with your eye doctor for a formal eye exam to look for swelling in the back of the eye ?Increase Topamax to 50 mg (2 pills) at bedtime for one week, then increase to 75 mg (3 pills) at bedtime for one week, then increase to 100 mg (4 pills) at bedtime ?Increase sumatriptan to 50 mg as needed for headaches. Can repeat dose in 2 hours if headache persists ?Start tizanidine up to every 6 hours as needed for muscle tension ?[facility] physical therapy exercises for the neck ?

## 2022-01-28 NOTE — Telephone Encounter (Signed)
Called patient and left a message for a call back.  

## 2022-01-28 NOTE — Telephone Encounter (Signed)
Left message with tomorrow's rescheduled appointment per 3/23 secure chat. ?

## 2022-01-29 ENCOUNTER — Inpatient Hospital Stay: Payer: BC Managed Care – PPO

## 2022-01-29 ENCOUNTER — Other Ambulatory Visit: Payer: Self-pay

## 2022-01-29 ENCOUNTER — Inpatient Hospital Stay (HOSPITAL_BASED_OUTPATIENT_CLINIC_OR_DEPARTMENT_OTHER): Payer: BC Managed Care – PPO

## 2022-01-29 VITALS — BP 139/95 | HR 73 | Temp 98.4°F | Resp 18

## 2022-01-29 DIAGNOSIS — F17210 Nicotine dependence, cigarettes, uncomplicated: Secondary | ICD-10-CM | POA: Diagnosis not present

## 2022-01-29 DIAGNOSIS — D500 Iron deficiency anemia secondary to blood loss (chronic): Secondary | ICD-10-CM

## 2022-01-29 DIAGNOSIS — D649 Anemia, unspecified: Secondary | ICD-10-CM | POA: Diagnosis present

## 2022-01-29 DIAGNOSIS — F419 Anxiety disorder, unspecified: Secondary | ICD-10-CM | POA: Diagnosis not present

## 2022-01-29 DIAGNOSIS — R202 Paresthesia of skin: Secondary | ICD-10-CM | POA: Diagnosis not present

## 2022-01-29 DIAGNOSIS — I10 Essential (primary) hypertension: Secondary | ICD-10-CM | POA: Diagnosis not present

## 2022-01-29 DIAGNOSIS — E611 Iron deficiency: Secondary | ICD-10-CM | POA: Diagnosis not present

## 2022-01-29 DIAGNOSIS — D75839 Thrombocytosis, unspecified: Secondary | ICD-10-CM | POA: Diagnosis not present

## 2022-01-29 DIAGNOSIS — D259 Leiomyoma of uterus, unspecified: Secondary | ICD-10-CM | POA: Diagnosis not present

## 2022-01-29 DIAGNOSIS — Z809 Family history of malignant neoplasm, unspecified: Secondary | ICD-10-CM | POA: Diagnosis not present

## 2022-01-29 DIAGNOSIS — Z79899 Other long term (current) drug therapy: Secondary | ICD-10-CM | POA: Diagnosis not present

## 2022-01-29 DIAGNOSIS — M542 Cervicalgia: Secondary | ICD-10-CM | POA: Diagnosis not present

## 2022-01-29 DIAGNOSIS — Z803 Family history of malignant neoplasm of breast: Secondary | ICD-10-CM | POA: Diagnosis not present

## 2022-01-29 DIAGNOSIS — Z801 Family history of malignant neoplasm of trachea, bronchus and lung: Secondary | ICD-10-CM | POA: Diagnosis not present

## 2022-01-29 DIAGNOSIS — N920 Excessive and frequent menstruation with regular cycle: Secondary | ICD-10-CM | POA: Diagnosis not present

## 2022-01-29 DIAGNOSIS — R519 Headache, unspecified: Secondary | ICD-10-CM | POA: Diagnosis not present

## 2022-01-29 MED ORDER — SODIUM CHLORIDE 0.9 % IV SOLN
300.0000 mg | Freq: Once | INTRAVENOUS | Status: AC
  Administered 2022-01-29: 300 mg via INTRAVENOUS
  Filled 2022-01-29: qty 300

## 2022-01-29 MED ORDER — ACETAMINOPHEN 325 MG PO TABS
650.0000 mg | ORAL_TABLET | Freq: Once | ORAL | Status: AC
  Administered 2022-01-29: 650 mg via ORAL
  Filled 2022-01-29: qty 2

## 2022-01-29 MED ORDER — LORATADINE 10 MG PO TABS
10.0000 mg | ORAL_TABLET | Freq: Once | ORAL | Status: AC
  Administered 2022-01-29: 10 mg via ORAL
  Filled 2022-01-29: qty 1

## 2022-01-29 MED ORDER — SODIUM CHLORIDE 0.9 % IV SOLN: Freq: Once | INTRAVENOUS | Status: AC

## 2022-01-29 NOTE — Telephone Encounter (Signed)
Called patient and informed her of recommendations by Dr Posey Pronto and patient informed me that she is going to be seeing another Neurologist. Patient did not need anything further from [facility] Neurology.  ?

## 2022-01-29 NOTE — Progress Notes (Signed)
Patient completed iron infusion at 5:38pm. She declined to stay the full 30 minute observation. VSS and she denies any complaints at this time. Patient provided with education and verbalized an understanding to call this office or report to the ED if any symptoms of a reaction appear. Patient discharged ambulatory from the infusion room.  ?

## 2022-01-29 NOTE — Patient Instructions (Addendum)
[facility]  Discharge Instructions: ?Thank you for choosing [facility] to provide your oncology and hematology care.  ? ?If you have a lab appointment with the [facility], please go directly to the [facility] and check in at the registration area. ?  ? ? ?We strive to give you quality time with your provider. You may need to reschedule your appointment if you arrive late (15 or more minutes).  Arriving late affects you and other patients whose appointments are after yours.  Also, if you miss three or more appointments without notifying the office, you may be dismissed from the clinic at the provider?s discretion.    ?  ?For prescription refill requests, have your pharmacy contact our office and allow 72 hours for refills to be completed.   ? ?Today you received Venofer , an iron infusion. ?  ?To help prevent nausea and vomiting after your treatment, we encourage you to take your nausea medication as directed. ? ?BELOW ARE SYMPTOMS THAT SHOULD BE REPORTED IMMEDIATELY: ?*FEVER GREATER THAN 100.4 F (38 ?C) OR HIGHER ?*CHILLS OR SWEATING ?*NAUSEA AND VOMITING THAT IS NOT CONTROLLED WITH YOUR NAUSEA MEDICATION ?*UNUSUAL SHORTNESS OF BREATH ?*UNUSUAL BRUISING OR BLEEDING ?*URINARY PROBLEMS (pain or burning when urinating, or frequent urination) ?*BOWEL PROBLEMS (unusual diarrhea, constipation, pain near the anus) ?TENDERNESS IN MOUTH AND THROAT WITH OR WITHOUT PRESENCE OF ULCERS (sore throat, sores in mouth, or a toothache) ?UNUSUAL RASH, SWELLING OR PAIN  ?UNUSUAL VAGINAL DISCHARGE OR ITCHING  ? ?Items with * indicate a potential emergency and should be followed up as soon as possible or go to the Emergency Department if any problems should occur. ? ?Please show the CHEMOTHERAPY ALERT CARD or IMMUNOTHERAPY ALERT CARD at check-in to the Emergency Department and triage nurse. ? ?Should you have questions after your visit or need to cancel or reschedule your appointment,  please contact [facility]  Dept: [phone]  and follow the prompts.  Office hours are 8:00 a.m. to 4:30 p.m. Monday - Friday. Please note that voicemails left after 4:00 p.m. may not be returned until the following business day.  We are closed weekends and major holidays. You have access to a nurse at all times for urgent questions. Please call the main number to the clinic Dept: [phone] and follow the prompts. ? ? ?For any non-urgent questions, you may also contact your provider using MyChart. We now offer e-Visits for anyone 32 and older to request care online for non-urgent symptoms. For details visit [email or web address]. ?  ?Also download the MyChart app! Go to the app store, search "MyChart", open the app, select [facility], and log in with your MyChart username and password. ? ?Due to Covid, a mask is required upon entering the hospital/clinic. If you do not have a mask, one will be given to you upon arrival. For doctor visits, patients may have 1 support person aged 27 or older with them. For treatment visits, patients cannot have anyone with them due to current Covid guidelines and our immunocompromised population.  ? ?Iron Sucrose Injection ?What is this medication? ?IRON SUCROSE (EYE ern SOO krose) treats low levels of iron (iron deficiency anemia) in people with kidney disease. Iron is a mineral that plays an important role in making red blood cells, which carry oxygen from your lungs to the rest of your body. ?This medicine may be used for other purposes; ask your health care provider or pharmacist if you  have questions. ?COMMON BRAND NAME(S): Venofer ?What should I tell my care team before I take this medication? ?They need to know if you have any of these conditions: ?Anemia not caused by low iron levels ?Heart disease ?High levels of iron in the blood ?Kidney disease ?Liver disease ?An unusual or allergic reaction to iron, other medications, foods, dyes, or  preservatives ?Pregnant or trying to get pregnant ?Breast-feeding ?How should I use this medication? ?This medication is for infusion into a vein. It is given in a hospital or clinic setting. ?Talk to your care team about the use of this medication in children. While this medication may be prescribed for children as young as 2 years for selected conditions, precautions do apply. ?Overdosage: If you think you have taken too much of this medicine contact a poison control center or emergency room at once. ?NOTE: This medicine is only for you. Do not share this medicine with others. ?What if I miss a dose? ?It is important not to miss your dose. Call your care team if you are unable to keep an appointment. ?What may interact with this medication? ?Do not take this medication with any of the following: ?Deferoxamine ?Dimercaprol ?Other iron products ?This medication may also interact with the following: ?Chloramphenicol ?Deferasirox ?This list may not describe all possible interactions. Give your health care provider a list of all the medicines, herbs, non-prescription drugs, or dietary supplements you use. Also tell them if you smoke, drink alcohol, or use illegal drugs. Some items may interact with your medicine. ?What should I watch for while using this medication? ?Visit your care team regularly. Tell your care team if your symptoms do not start to get better or if they get worse. You may need blood work done while you are taking this medication. ?You may need to follow a special diet. Talk to your care team. Foods that contain iron include: whole grains/cereals, dried fruits, beans, or peas, leafy green vegetables, and organ meats (liver, kidney). ?What side effects may I notice from receiving this medication? ?Side effects that you should report to your care team as soon as possible: ?Allergic reactions--skin rash, itching, hives, swelling of the face, lips, tongue, or throat ?Low blood pressure--dizziness, feeling  faint or lightheaded, blurry vision ?Shortness of breath ?Side effects that usually do not require medical attention (report to your care team if they continue or are bothersome): ?Flushing ?Headache ?Joint pain ?Muscle pain ?Nausea ?Pain, redness, or irritation at injection site ?This list may not describe all possible side effects. Call your doctor for medical advice about side effects. You may report side effects to FDA at 1-800-FDA-1088. ?Where should I keep my medication? ?This medication is given in a hospital or clinic and will not be stored at home. ?NOTE: This sheet is a summary. It may not cover all possible information. If you have questions about this medicine, talk to your doctor, pharmacist, or health care provider. ?? 2022 Elsevier/Gold Standard (2021-03-20 00:00:00) ? ? ?

## 2022-01-29 NOTE — Progress Notes (Signed)
Patient requested to increase the rate of her infusion because it was very long.  ? ?Per pharmacy this dose can not be administered faster. She was approved to have either the '200mg'$  Venofer or the '300mg'$  Venofer. The patient would prefer the short infusion time that comes with the '200mg'$  dose even if she has to come in additional visits. Pharmacy states this is would not cause any issue with her current authorization through her insurance.  ? ? ?Dr. Burr Medico contacted and she authorized the change. This RN sent a scheduling message to have the appointments changed and 2 additional added on.  ? ?

## 2022-02-02 ENCOUNTER — Encounter: Payer: Self-pay | Admitting: Psychiatry

## 2022-02-02 ENCOUNTER — Telehealth: Payer: Self-pay

## 2022-02-02 ENCOUNTER — Other Ambulatory Visit: Payer: Self-pay | Admitting: Psychiatry

## 2022-02-02 DIAGNOSIS — R519 Headache, unspecified: Secondary | ICD-10-CM

## 2022-02-02 NOTE — Telephone Encounter (Signed)
Dr Billey Gosling completed surgical clearance letter: Patient should have CT venogram before procedure (CT venogram scheduled 02/10/22). Faxed to [employer], received confirmation.  ?

## 2022-02-02 NOTE — Telephone Encounter (Signed)
Received medical clearance received from Dr. Murrell Redden at [facility] on upcoming procedure and if pt is cleared from a neurology stand point. I have placed on MD's desk for review.  ?

## 2022-02-03 ENCOUNTER — Telehealth: Payer: Self-pay | Admitting: Psychiatry

## 2022-02-03 ENCOUNTER — Telehealth: Payer: Self-pay | Admitting: Nurse Practitioner

## 2022-02-03 NOTE — Telephone Encounter (Signed)
BCBS Josem Kaufmann: [id number] (exp. 02/03/22 to 03/04/22) patient is scheduled at GI for 02/10/22.  ?

## 2022-02-03 NOTE — Telephone Encounter (Signed)
.  Called patient to schedule appointment per 3/29 inbasket, patient is aware of date and time.   ?

## 2022-02-08 ENCOUNTER — Ambulatory Visit (HOSPITAL_BASED_OUTPATIENT_CLINIC_OR_DEPARTMENT_OTHER)
Admission: RE | Admit: 2022-02-08 | Payer: BC Managed Care – PPO | Source: Home / Self Care | Admitting: Obstetrics and Gynecology

## 2022-02-08 HISTORY — DX: Anemia, unspecified: D64.9

## 2022-02-08 HISTORY — DX: Leiomyoma of uterus, unspecified: D25.9

## 2022-02-08 SURGERY — HYSTERECTOMY, TOTAL, LAPAROSCOPIC, WITH SALPINGECTOMY
Anesthesia: General | Laterality: Bilateral

## 2022-02-10 ENCOUNTER — Other Ambulatory Visit: Payer: Medicaid Other

## 2022-02-10 ENCOUNTER — Encounter: Payer: Self-pay | Admitting: *Deleted

## 2022-02-10 ENCOUNTER — Other Ambulatory Visit: Payer: Self-pay | Admitting: Psychiatry

## 2022-02-10 ENCOUNTER — Telehealth: Payer: Self-pay | Admitting: Psychiatry

## 2022-02-10 MED ORDER — DIAZEPAM 5 MG PO TABS: ORAL_TABLET | ORAL | 0 refills | Status: DC

## 2022-02-10 NOTE — Telephone Encounter (Signed)
Pt left voicemail at 1:58pm asking for an anxiety/panic attack medication be prescribed for her MRI on 02/13/22. Pt stated Dr. Posey Pronto has prescribed her diazepam (VALIUM) 5 MG tablet previously for her MRI's. Would like a medication sent to [facility]  ? ?

## 2022-02-10 NOTE — Telephone Encounter (Signed)
Rx for valium sent to her pharmacy, thanks

## 2022-02-11 ENCOUNTER — Encounter: Payer: Self-pay | Admitting: Psychiatry

## 2022-02-11 ENCOUNTER — Encounter: Payer: Self-pay | Admitting: Nurse Practitioner

## 2022-02-11 ENCOUNTER — Ambulatory Visit
Admission: RE | Admit: 2022-02-11 | Discharge: 2022-02-11 | Disposition: A | Discharge status: Home / Self Care | Payer: BC Managed Care – PPO | Source: Ambulatory Visit | Attending: Psychiatry | Admitting: Psychiatry

## 2022-02-11 DIAGNOSIS — R519 Headache, unspecified: Secondary | ICD-10-CM

## 2022-02-11 MED ORDER — IOPAMIDOL (ISOVUE-370) INJECTION 76%
75.0000 mL | Freq: Once | INTRAVENOUS | Status: AC | PRN
  Administered 2022-02-11: 75 mL via INTRAVENOUS

## 2022-02-11 NOTE — Telephone Encounter (Signed)
All Messages FWD to Dr Billey Gosling  ?

## 2022-02-13 ENCOUNTER — Other Ambulatory Visit: Payer: Medicaid Other

## 2022-02-14 ENCOUNTER — Ambulatory Visit
Admission: RE | Admit: 2022-02-14 | Discharge: 2022-02-14 | Disposition: A | Discharge status: Home / Self Care | Payer: BC Managed Care – PPO | Source: Ambulatory Visit | Attending: Neurology | Admitting: Neurology

## 2022-02-14 DIAGNOSIS — G935 Compression of brain: Secondary | ICD-10-CM

## 2022-02-15 ENCOUNTER — Other Ambulatory Visit: Payer: Self-pay | Admitting: Psychiatry

## 2022-02-15 ENCOUNTER — Telehealth: Payer: Self-pay | Admitting: Psychiatry

## 2022-02-15 ENCOUNTER — Encounter: Payer: Self-pay | Admitting: Psychiatry

## 2022-02-15 DIAGNOSIS — G932 Benign intracranial hypertension: Secondary | ICD-10-CM

## 2022-02-15 MED ORDER — TOPIRAMATE 50 MG PO TABS
ORAL_TABLET | ORAL | 3 refills | Status: DC
Start: 2022-02-15 — End: 2022-04-08

## 2022-02-15 NOTE — Telephone Encounter (Signed)
Referral sent to [facility] [phone]. ?

## 2022-02-17 ENCOUNTER — Encounter: Payer: Self-pay | Admitting: Psychiatry

## 2022-02-17 NOTE — Telephone Encounter (Signed)
Let's have her schedule a follow up visit to go over all of her questions. There are too many questions for me to keep up with them over MyChart

## 2022-02-18 ENCOUNTER — Other Ambulatory Visit: Payer: Self-pay | Admitting: Psychiatry

## 2022-02-18 MED ORDER — RIZATRIPTAN BENZOATE 10 MG PO TABS: 10.0000 mg | ORAL_TABLET | ORAL | 3 refills | Status: DC | PRN

## 2022-02-18 NOTE — Telephone Encounter (Signed)
She can take potasium 99 mg daily (can get this over the counter) to help with the tingling sensation. I sent in a prescription for rizatriptan, we can see if this works better than the sumatriptan. She should limit this to 2 days per week to avoid rebound headaches

## 2022-02-22 ENCOUNTER — Inpatient Hospital Stay: Payer: BC Managed Care – PPO

## 2022-02-25 ENCOUNTER — Encounter: Payer: Self-pay | Admitting: Psychiatry

## 2022-02-25 NOTE — Telephone Encounter (Signed)
Please advise patient to go to UC or ER at this point.  ?

## 2022-03-01 ENCOUNTER — Inpatient Hospital Stay: Payer: BC Managed Care – PPO | Attending: Nurse Practitioner

## 2022-03-03 ENCOUNTER — Telehealth: Payer: Self-pay | Admitting: *Deleted

## 2022-03-03 NOTE — Telephone Encounter (Signed)
VO per Dr Felecia Shelling as work in in Dr Georgina Peer absence, patient will come in today and receive 2 samples of Nurtec to try. She has failed rizatriptan, and stated she thinks she has tried [place] in the past which wasn't helpul.  ?

## 2022-03-04 ENCOUNTER — Encounter: Payer: Self-pay | Admitting: *Deleted

## 2022-03-04 NOTE — Telephone Encounter (Addendum)
Patient called asking for specific directions for taking nurtec. I informed her: ?Place one tablet on or under your tongue as soon as you notice symptoms of a migraine episode. Let the tablet dissolve, then swallow it. You can repeat this dose in 24 hours if needed. ?She stated she took one yesterday and her headache instantly went away. She asked if she can get 30 day supply if it continues to work. I advised her she can, but it isn't to be taken daily. Even if used as a preventative it is taken every other day. She was at pharmacy picking up OTC. I advised her she may get into a rebound headache situation. She stated she has to have something daily; she has headaches from other conditions, not just her migraines.  I advised she may need to see MD sooner to discuss options.  I stated will let Dr Billey Gosling know on Monday of her concerns, questions. She will not take any OTC today but repeat Nurtec in 24 hours. Patient verbalized understanding, appreciation. ?Put her on wait list; her FU is in August.  ? ?

## 2022-03-09 ENCOUNTER — Other Ambulatory Visit: Payer: Self-pay | Admitting: Psychiatry

## 2022-03-09 ENCOUNTER — Encounter: Payer: Self-pay | Admitting: Psychiatry

## 2022-03-09 ENCOUNTER — Telehealth: Payer: Self-pay | Admitting: *Deleted

## 2022-03-09 MED ORDER — NURTEC 75 MG PO TBDP: 75.0000 mg | ORAL_TABLET | ORAL | 3 refills | Status: DC

## 2022-03-09 NOTE — Telephone Encounter (Signed)
We can try to get her approved for Nurtec every other day for prevention. I sent in a prescription to her pharmacy

## 2022-03-09 NOTE — Telephone Encounter (Signed)
Nurtec PA< Key: [id number] - Rx #: [id number]. ?Your information has been submitted to [place]. [employer] [place] will review the request and notify you of the determination decision directly, typically within 72 hours of receiving all information. ?If [employer] has not responded within the specified timeframe or if you have any questions about your PA submission, contact [facility] directly at [phone]. ?

## 2022-03-10 NOTE — Telephone Encounter (Signed)
Nurtec denied, office to receive denial letter.  ?

## 2022-03-15 ENCOUNTER — Ambulatory Visit: Payer: Medicaid Other

## 2022-03-15 ENCOUNTER — Other Ambulatory Visit: Payer: Self-pay | Admitting: Psychiatry

## 2022-03-15 ENCOUNTER — Telehealth: Payer: Self-pay | Admitting: *Deleted

## 2022-03-15 MED ORDER — EMGALITY 120 MG/ML ~~LOC~~ SOAJ: 1.0000 "pen " | SUBCUTANEOUS | 3 refills | Status: DC

## 2022-03-15 MED ORDER — EMGALITY 120 MG/ML ~~LOC~~ SOAJ: 2.0000 "pen " | Freq: Once | SUBCUTANEOUS | 0 refills | Status: AC

## 2022-03-15 NOTE — Telephone Encounter (Signed)
She might benefit from a monthly injectable for prevention since Nurtec worked well for her and they target the same protein. I believe her insurance will cover Emgality for chronic migraine. I've placed an order for Emgality for her

## 2022-03-15 NOTE — Telephone Encounter (Signed)
Emgality PA< Key: [place], [id number]. ?Your information has been submitted to [place]. Blue Cross [place] will review the request and notify you of the determination decision directly, typically within 72 hours of receiving all information. ?If [employer] [place] has not responded within the specified timeframe or if you have any questions about your PA submission, contact [facility] directly at [phone]. ?

## 2022-03-15 NOTE — Telephone Encounter (Signed)
Have not received nurtec denial letter, called [employer], pharmacy line, spoke with Sia B. She will fax denial letter. She stated it was denied because emgality is approved when used to prevent episodic migraines, and member doesn't have episodic migraines.  ? ?

## 2022-03-16 ENCOUNTER — Encounter: Payer: Self-pay | Admitting: *Deleted

## 2022-03-16 NOTE — Telephone Encounter (Signed)
Emgality approved Effective from 03/15/2022 through 06/06/2022. Starter kit included in authorization. Faxed approval to pharmacy. ?

## 2022-03-22 ENCOUNTER — Ambulatory Visit: Payer: Medicaid Other

## 2022-03-29 ENCOUNTER — Other Ambulatory Visit: Payer: Self-pay

## 2022-03-29 DIAGNOSIS — D500 Iron deficiency anemia secondary to blood loss (chronic): Secondary | ICD-10-CM

## 2022-03-30 ENCOUNTER — Inpatient Hospital Stay: Payer: BC Managed Care – PPO | Attending: Nurse Practitioner

## 2022-03-30 ENCOUNTER — Other Ambulatory Visit: Payer: Self-pay | Admitting: Psychiatry

## 2022-03-30 ENCOUNTER — Telehealth: Payer: Self-pay | Admitting: *Deleted

## 2022-03-30 MED ORDER — QULIPTA 60 MG PO TABS: 60.0000 mg | ORAL_TABLET | Freq: Every day | ORAL | 3 refills | Status: DC

## 2022-03-30 NOTE — Telephone Encounter (Signed)
We can try to get Qulipta approved instead. I'll put in an rx for it. If insurance doesn't approve Qulipta then injections will likely be our only option

## 2022-03-30 NOTE — Telephone Encounter (Signed)
[place] PA, Key: [id number] , faxed office notes ot be attached to key.

## 2022-03-30 NOTE — Telephone Encounter (Signed)
Received e mail: documents attached. Your information has been submitted to [employer] [place]. [employer] Cross [place] will review the request and notify you of the determination decision directly, typically within 72 hours of receiving all information. If [employer] [place] has not responded within the specified timeframe or if you have any questions about your PA submission, contact [employer] Cross [place] directly at [phone]. 

## 2022-03-31 NOTE — Telephone Encounter (Signed)
Qulipta Approved today.  Effective from 03/30/2022 through 06/21/2022. Sent her my chart to advise.

## 2022-04-03 ENCOUNTER — Other Ambulatory Visit: Payer: Self-pay | Admitting: Psychiatry

## 2022-04-07 ENCOUNTER — Other Ambulatory Visit: Payer: Self-pay | Admitting: Psychiatry

## 2022-04-07 ENCOUNTER — Telehealth: Payer: Self-pay | Admitting: Psychiatry

## 2022-04-07 ENCOUNTER — Encounter: Payer: Self-pay | Admitting: Psychiatry

## 2022-04-07 DIAGNOSIS — G935 Compression of brain: Secondary | ICD-10-CM

## 2022-04-07 DIAGNOSIS — H4710 Unspecified papilledema: Secondary | ICD-10-CM

## 2022-04-07 NOTE — Telephone Encounter (Signed)
Referral for Neurology sent to [facility] [phone].

## 2022-04-08 ENCOUNTER — Other Ambulatory Visit: Payer: Self-pay | Admitting: Psychiatry

## 2022-04-08 ENCOUNTER — Ambulatory Visit: Payer: BC Managed Care – PPO | Admitting: Nurse Practitioner

## 2022-04-08 MED ORDER — ACETAZOLAMIDE 250 MG PO TABS
ORAL_TABLET | ORAL | 6 refills | Status: DC
Start: 2022-04-08 — End: 2022-04-29

## 2022-04-15 ENCOUNTER — Telehealth: Payer: Self-pay | Admitting: Psychiatry

## 2022-04-15 NOTE — Telephone Encounter (Signed)
LVM and sent mychart msg informing pt of r/s needed for 8/14 appt- Dr. Billey Gosling out.

## 2022-04-19 ENCOUNTER — Telehealth: Payer: Self-pay | Admitting: Psychiatry

## 2022-04-19 NOTE — Telephone Encounter (Signed)
Yep that's fine as long as she doesn't take any Topamax tonight with the Diamox

## 2022-04-19 NOTE — Telephone Encounter (Signed)
She should stop the Topamax and switch to the Diamox. She should not take both togeter

## 2022-04-19 NOTE — Telephone Encounter (Signed)
Called patient who stated qulipta caused more headaches. She only took it one day. She has been taking topamax 50 mg, one in morning and two at night. She has picked up acetazolamide but not started. She is unsure if she is to take it with topamax. I advised her I;ll clarify with Dr Billey Gosling and call her back. Patient verbalized understanding, appreciation.

## 2022-04-19 NOTE — Telephone Encounter (Signed)
Pt asking if she should take both acetaZOLAMIDE (DIAMOX) 250 MG tablet and toperamate, please call.

## 2022-04-19 NOTE — Addendum Note (Signed)
Addended by: Florian Buff C on: 04/19/2022 01:01 PM   Modules accepted: Orders

## 2022-04-19 NOTE — Telephone Encounter (Signed)
Patient verbalized in earlier call that she will not take topamax tonight, will stop it all together. Topamax has been deleted form her MAR.

## 2022-04-19 NOTE — Telephone Encounter (Signed)
Spoke with patient and informed her to stop topamax and begin Diamox. She stated she took one topamax this morning, asked if she can take Diamox today. I advised she can take the night time dose. Begin regular dosing tomorrow. Patient verbalized understanding, appreciation.

## 2022-04-26 ENCOUNTER — Other Ambulatory Visit: Payer: Self-pay | Admitting: Psychiatry

## 2022-04-27 ENCOUNTER — Other Ambulatory Visit: Payer: Medicaid Other

## 2022-04-28 ENCOUNTER — Encounter (INDEPENDENT_AMBULATORY_CARE_PROVIDER_SITE_OTHER): Payer: BC Managed Care – PPO | Admitting: Psychiatry

## 2022-04-28 DIAGNOSIS — G932 Benign intracranial hypertension: Secondary | ICD-10-CM

## 2022-04-29 MED ORDER — ACETAZOLAMIDE 250 MG PO TABS: 750.0000 mg | ORAL_TABLET | Freq: Two times a day (BID) | ORAL | 6 refills | Status: DC

## 2022-04-29 MED ORDER — DICLOFENAC POTASSIUM 50 MG PO TABS: ORAL_TABLET | ORAL | 6 refills | Status: DC

## 2022-04-29 NOTE — Telephone Encounter (Signed)

## 2022-05-12 ENCOUNTER — Other Ambulatory Visit: Payer: Self-pay | Admitting: Psychiatry

## 2022-05-12 MED ORDER — CELECOXIB 100 MG PO CAPS: 100.0000 mg | ORAL_CAPSULE | ORAL | 6 refills | Status: DC | PRN

## 2022-05-18 ENCOUNTER — Other Ambulatory Visit: Payer: Self-pay | Admitting: Psychiatry

## 2022-06-07 NOTE — Progress Notes (Unsigned)
CC:  headaches  Follow-up Visit  Last visit: 01/28/22  Brief HPI: 46 year old female with a history of Bells palsy (left) 2018, Chiari malformation who follows in clinic for headaches. MRI brain 07/14/21 showed an 8 mm Chiari malformation and partially empty sella.  At her last visit, Topamax dose and Imitrex dose were increased. MRI C-spine was ordered.  Interval History: She saw her optometrist in March who noted papilledema on her exam. CTV was ordered, and she was referred to ophthalmology. CTV was negative for thrombosis or venous stenosis. She did have evidence of sinusitis on the left. 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   She developed paresthesias on Topamax so she was switched to [place] (did not want an injectable medication). This caused constipation, so she stopped it. She was started on Diamox to treat for possible IIH.  She did not have improvement with Imitrex or Maxalt for rescue. Tried Nurtec sample which did resolve her headache, but was denied by insurance. Diclofenac helped but caused GI upset so she was started on Celebrex. This has been slightly effective.  Pressure in her head has improved with Diamox. She continues to have shooting pain which radiates from behind her ears to behind her eyes.   In the past month she has started to feel paresthesias and a buzzing sensation in her arms and on her face. She is taking potassium 99 mg daily which does help. She has also started to have word finding difficulty.  Feels her vision is worse. She struggles to see her cell phone screen. She is not sure when her next ophthalmology appointment is.   Headache days per month: 30 Headache free days per month: 0  Current Headache Regimen: Preventative: Diamox 750 mg BID Abortive: Celebrex   Prior Therapies                                  Topamax - paresthesias Metoprolol Cymbalta Citalopram Diamox Qulipta -  constipation Imitrex Maxalt Ubrelvy Nurtec - helped but was not covered by insurance (too many headache days per month) Diclofenac - stomach upset Celebrex  Physical Exam:   Vital Signs: BP (!) 114/91   Pulse 84   Ht '5\' 6"'$  (1.676 m)   Wt 182 lb 6 oz (82.7 kg)   BMI 29.44 kg/m  GENERAL:  well appearing, in no acute distress, alert  SKIN:  Color, texture, turgor normal. No rashes or lesions HEAD:  Normocephalic/atraumatic. RESP: normal respiratory effort MSK:  No gross joint deformities.   NEUROLOGICAL: Mental Status: Alert, oriented to person, place and time, Follows commands, and Speech fluent and appropriate. Cranial Nerves: PERRL, face symmetric, no dysarthria, hearing grossly intact Motor: moves all extremities equally Gait: normal-based.  IMPRESSION: 46 year old female with a history of Bell's palsy, Chiari malformation (dx 2017) who presents for follow up of headaches. At this point it is unclear if she truly has IIH. Ophthalmology exam was inconclusive for papilledema vs pseudopapilledema. She does not appear to have significant papilledema on non-dilated fundus exam today but does continue to report vision changes and headaches. She noted only mild improvement with Diamox. LP would offer diagnostic clarity, though would prefer to avoid this if possible due to increased risk of herniation with Chiari malformation. Will refer to [facility] for second opinion regarding her headache management. For now will continue Diamox at its current dose and will add Cymbalta for headache  prevention. Suspect she would benefit from CGRP injections given significant improvement with Nurtec, but she would prefer to avoid injections at this time.  PLAN: -Continue Diamox 750 mg BID for now -Start Cymbalta 20 mg daily -Referral to [facility] for second opinion re headache management  Follow-up: 7 months  I spent a total of 44 minutes on the date of the service. Headache education was done.  Discussed medication side effects, adverse reactions and drug interactions. Written educational materials and patient instructions outlining all of the above were given.  Genia Harold, MD 06/08/22 4:08 PM

## 2022-06-08 ENCOUNTER — Ambulatory Visit (INDEPENDENT_AMBULATORY_CARE_PROVIDER_SITE_OTHER): Payer: BC Managed Care – PPO | Admitting: Psychiatry

## 2022-06-08 VITALS — BP 114/91 | HR 84 | Ht 66.0 in | Wt 182.4 lb

## 2022-06-08 DIAGNOSIS — G935 Compression of brain: Secondary | ICD-10-CM

## 2022-06-08 DIAGNOSIS — R519 Headache, unspecified: Secondary | ICD-10-CM | POA: Diagnosis not present

## 2022-06-08 MED ORDER — DULOXETINE HCL 20 MG PO CPEP: 20.0000 mg | ORAL_CAPSULE | Freq: Every day | ORAL | 6 refills | Status: DC

## 2022-06-16 ENCOUNTER — Encounter: Payer: Self-pay | Admitting: Psychiatry

## 2022-06-16 DIAGNOSIS — Z0271 Encounter for disability determination: Secondary | ICD-10-CM

## 2022-06-21 ENCOUNTER — Ambulatory Visit: Payer: BC Managed Care – PPO | Admitting: Psychiatry

## 2022-06-30 ENCOUNTER — Encounter: Payer: Self-pay | Admitting: Psychiatry

## 2022-07-01 ENCOUNTER — Other Ambulatory Visit: Payer: Self-pay | Admitting: *Deleted

## 2022-07-01 MED ORDER — TIZANIDINE HCL 2 MG PO TABS: ORAL_TABLET | ORAL | 3 refills | Status: DC

## 2022-07-01 MED ORDER — DULOXETINE HCL 20 MG PO CPEP
20.0000 mg | ORAL_CAPSULE | Freq: Every day | ORAL | 5 refills | Status: DC
Start: 2022-07-01 — End: 2022-08-31

## 2022-07-07 ENCOUNTER — Telehealth: Payer: Self-pay | Admitting: Cardiology

## 2022-07-07 MED ORDER — METOPROLOL SUCCINATE ER 25 MG PO TB24: 12.5000 mg | ORAL_TABLET | Freq: Every day | ORAL | 0 refills | Status: DC

## 2022-07-07 NOTE — Telephone Encounter (Signed)
*  STAT* If patient is at the pharmacy, call can be transferred to refill team.   1. Which medications need to be refilled? (please list name of each medication and dose if known) metoprolol succinate (TOPROL XL) 25 MG 24 hr tablet (Expired)  2. Which pharmacy/location (including street and city if local pharmacy) is medication to be sent to?  [facility] [address] [place]  3. Do they need a 30 day or 90 day supply? [place]

## 2022-07-08 ENCOUNTER — Encounter: Payer: Self-pay | Admitting: Nurse Practitioner

## 2022-07-08 ENCOUNTER — Other Ambulatory Visit: Payer: Self-pay | Admitting: *Deleted

## 2022-07-08 MED ORDER — METOPROLOL SUCCINATE ER 25 MG PO TB24: 12.5000 mg | ORAL_TABLET | Freq: Every day | ORAL | 0 refills | Status: DC

## 2022-07-09 ENCOUNTER — Other Ambulatory Visit: Payer: Self-pay | Admitting: Otolaryngology

## 2022-07-09 DIAGNOSIS — E041 Nontoxic single thyroid nodule: Secondary | ICD-10-CM

## 2022-07-10 ENCOUNTER — Other Ambulatory Visit: Payer: Self-pay | Admitting: Cardiology

## 2022-07-13 ENCOUNTER — Ambulatory Visit
Admission: RE | Admit: 2022-07-13 | Discharge: 2022-07-13 | Disposition: A | Discharge status: Home / Self Care | Payer: BC Managed Care – PPO | Source: Ambulatory Visit | Attending: Otolaryngology | Admitting: Otolaryngology

## 2022-07-13 DIAGNOSIS — E041 Nontoxic single thyroid nodule: Secondary | ICD-10-CM

## 2022-07-13 NOTE — Progress Notes (Deleted)
[place]   [phone] [phone]   Clinic Follow up Note   Patient Care Team: Crossnore Nation, MD as PCP - General (Internal Medicine) Satira Sark, MD as PCP - Cardiology (Cardiology) Alda Berthold, DO as Consulting Physician (Neurology) 07/13/2022  CHIEF COMPLAINT: History of IDA and thrombocytosis  CURRENT THERAPY: Treatment for IDA and observation  INTERVAL HISTORY: [name] returns for follow-up as scheduled, last seen by me as a new patient 01/25/2022.  She was scheduled to undergo total hysterectomy and salpingectomy/3/23 which was postponed due to headaches.  This led to neuro work-up and she was diagnosed with migraines.  She has not rescheduled the hysterectomy.   REVIEW OF SYSTEMS:   Constitutional: Denies fevers, chills or abnormal weight loss Eyes: Denies blurriness of vision Ears, nose, mouth, throat, and face: Denies mucositis or sore throat Respiratory: Denies cough, dyspnea or wheezes Cardiovascular: Denies palpitation, chest discomfort or lower extremity swelling Gastrointestinal:  Denies nausea, heartburn or change in bowel habits Skin: Denies abnormal skin rashes Lymphatics: Denies new lymphadenopathy or easy bruising Neurological:Denies numbness, tingling or new weaknesses Behavioral/Psych: Mood is stable, no new changes  All other systems were reviewed with the patient and are negative.  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 Lonia Mad, [place]  10/29/21. Patient wore a long term heart monitor that demonstrated rare PACs and PVCs and a limited episode of junctional tachycardia. No treatment required unless palpitations were bothersome. Patient prescribed Metoprol 25 mg 24 hour 1/2 tablet daily.   Paresthesia 11/2021   Patient saw neurologist, Dr. Narda Amber on 12/04/21 (see MD  note in Epic) for parathesia and whole body pain. On 01/05/22 the patient had a NCW w/ EMG that was normal.   Sciatica 2020   chronic low back pain with sciatica   Sickle cell trait [facility])    Uterine leiomyoma     SURGICAL HISTORY: Past Surgical History:  Procedure Laterality Date   BRONCHOSCOPY     around 2009 per pt, pt had right-sided chest pain, pt stated results were normal    I have reviewed the social history and family history with the patient and they are unchanged from previous note.  ALLERGIES:  is allergic to hydromorphone.  MEDICATIONS:  Current Outpatient Medications  Medication Sig Dispense Refill   acetaminophen (TYLENOL) 500 MG tablet Take 500 mg by mouth every 6 (six) hours as needed.     acetaZOLAMIDE (DIAMOX) 250 MG tablet Take 3 tablets (750 mg total) by mouth 2 (two) times daily. 180 tablet 6   albuterol (VENTOLIN HFA) 108 (90 Base) MCG/ACT inhaler Inhale 2 puffs into the lungs every 6 (six) hours as needed for wheezing. Reported on 11/14/2015 18 g 0   amLODipine (NORVASC) 10 MG tablet Take 5 mg by mouth daily.     aspirin EC 81 MG tablet Take 81 mg by mouth daily.     celecoxib (CELEBREX) 100 MG  capsule Take 1 capsule (100 mg total) by mouth as needed (headaches). Take at onset of headache. Max dose 1 pill in 24 hours 10 capsule 6   celecoxib (CELEBREX) 100 MG capsule Take 100 mg by mouth 2 (two) times daily.     diazepam (VALIUM) 5 MG tablet Take 30-min prior to MRI.  Do not drive while on this medication. (Patient not taking: Reported on 06/08/2022) 1 tablet 0   diphenhydrAMINE (BENADRYL) 25 mg capsule Take 50 mg by mouth every 6  (six) hours as needed.     DULoxetine (CYMBALTA) 20 MG capsule Take 1 capsule (20 mg total) by mouth daily. 30 capsule 5   famotidine (PEPCID) 20 MG tablet Take 1 tablet by mouth as needed.     ferrous sulfate 325 (65 FE) MG tablet Take 1 tablet by mouth every other day. Patient takes about 2 or 3 times per week due to GI upset.     levothyroxine (SYNTHROID) 25 MCG tablet Take 25 mcg by mouth daily before breakfast.     megestrol (MEGACE) 20 MG tablet Take 20 mg by mouth daily.     metoprolol succinate (TOPROL-XL) 25 MG 24 hr tablet Take 0.5 tablets (12.5 mg total) by mouth daily. 15 tablet 0   naproxen sodium (ANAPROX) 550 MG tablet Take 550 mg by mouth 2 (two) times daily as needed. (Patient not taking: Reported on 06/08/2022)     ondansetron (ZOFRAN-ODT) 4 MG disintegrating tablet as needed.     tiZANidine (ZANAFLEX) 2 MG tablet TAKE 1 TABLET BY MOUTH EVERY 6 HOURS AS NEEDED FOR MUSCLE SPASM 30 tablet 3   No current facility-administered medications for this visit.    PHYSICAL EXAMINATION: ECOG PERFORMANCE STATUS: {CHL ONC ECOG PS:[id number]}  There were no vitals filed for this visit. There were no vitals filed for this visit.  GENERAL:alert, no distress and comfortable SKIN: skin color, texture, turgor are normal, no rashes or significant lesions EYES: normal, Conjunctiva are pink and non-injected, sclera clear OROPHARYNX:no exudate, no erythema and lips, buccal mucosa, and tongue normal  NECK: supple, thyroid normal size, non-tender, without nodularity LYMPH:  no palpable lymphadenopathy in the cervical, axillary or inguinal LUNGS: clear to auscultation and percussion with normal breathing effort HEART: regular rate & rhythm and no murmurs and no lower extremity edema ABDOMEN:abdomen soft, non-tender and normal bowel sounds Musculoskeletal:no cyanosis of digits and no clubbing  NEURO: alert & oriented x 3 with fluent speech, no focal motor/sensory deficits  LABORATORY DATA:  I  have reviewed the data as listed    Latest Ref Rng & Units 01/25/2022    2:37 PM 01/25/2022   12:30 PM 01/22/2022    9:42 AM  CBC  WBC 4.0 - 10.5 K/uL 9.9  9.7  10.8   Hemoglobin 12.0 - 15.0 g/dL 11.1  11.2  11.9   Hematocrit 36.0 - 46.0 % 34.4  34.2  36.7   Platelets 150 - 400 K/uL 575  587  435         Latest Ref Rng & Units 01/25/2022    2:37 PM 01/22/2022    9:42 AM 01/19/2021   10:37 PM  CMP  Glucose 70 - 99 mg/dL 105  84  90   BUN 6 - 20 mg/dL '7  9  5   '$ Creatinine 0.44 - 1.00 mg/dL 0.65  0.59  0.71   Sodium 135 - 145 mmol/L 138  137  137   Potassium 3.5 - 5.1 mmol/L 3.7  3.7  3.5   Chloride 98 - 111 mmol/L 110  110  104   CO2 22 - 32 mmol/L '22  17  26   '$ Calcium 8.9 - 10.3 mg/dL 8.7  9.0  9.0       RADIOGRAPHIC STUDIES: I have personally reviewed the radiological images as listed and agreed with the findings in the report. US THYROID  Result Date: 07/13/2022 CLINICAL DATA:  Thyroid nodule EXAM: THYROID ULTRASOUND TECHNIQUE: Ultrasound examination of the thyroid gland and adjacent soft tissues was performed. COMPARISON:  None available FINDINGS: Parenchymal Echotexture: Mildly heterogenous Isthmus: 0.4 cm Right lobe: 4.4 x 2.2 x 2.1 cm Left lobe: 3.7 x 1.7 x 1.5 cm _________________________________________________________ Estimated total number of nodules >/= 1 cm: 1 Number of spongiform nodules >/=  2 cm not described below (TR1): 0 Number of mixed cystic and solid nodules >/= 1.5 cm not described below (TR2): 0 _________________________________________________________ Nodule # 1: Location: Right; inferior Maximum size: 1.1 cm; Other 2 dimensions: 0.5 x 1.0 cm Composition: solid/almost completely solid (2) Echogenicity: isoechoic (1) Shape: not taller-than-wide (0) Margins: ill-defined (0) Echogenic foci: none (0) ACR TI-RADS total points: 3. ACR TI-RADS risk category: TR3 (3 points). ACR TI-RADS recommendations: Given size (<1.4 cm) and appearance, this nodule does NOT meet TI-RADS  criteria for biopsy or dedicated follow-up. _________________________________________________________ Remaining subcentimeter bilateral thyroid nodules do not meet criteria for FNA or imaging surveillance. IMPRESSION: Bilateral thyroid nodules measuring up to 1.1 cm, which do not meet criteria for FNA or imaging surveillance. The above is in keeping with the ACR TI-RADS recommendations - J Am Coll Radiol 2017;14:587-595. Electronically Signed   By: Miachel Roux M.D.   On: 07/13/2022 13:23     ASSESSMENT & PLAN:  No problem-specific Assessment & Plan notes found for this encounter.   No orders of the defined types were placed in this encounter.  All questions were answered. The patient knows to call the clinic with any problems, questions or concerns. No barriers to learning was detected. I spent {CHL ONC TIME VISIT - [id number]} counseling the patient face to face. The total time spent in the appointment was {CHL ONC TIME VISIT - HDQQI:[id number]} and more than 50% was on counseling and review of test results     Alla Feeling, NP 07/13/22

## 2022-07-14 ENCOUNTER — Other Ambulatory Visit: Payer: Self-pay

## 2022-07-14 DIAGNOSIS — D500 Iron deficiency anemia secondary to blood loss (chronic): Secondary | ICD-10-CM

## 2022-07-15 ENCOUNTER — Inpatient Hospital Stay: Payer: BC Managed Care – PPO

## 2022-07-15 ENCOUNTER — Inpatient Hospital Stay: Payer: BC Managed Care – PPO | Attending: Nurse Practitioner | Admitting: Nurse Practitioner

## 2022-08-31 ENCOUNTER — Encounter: Payer: Self-pay | Admitting: Psychiatry

## 2022-08-31 MED ORDER — DULOXETINE HCL 20 MG PO CPEP: 20.0000 mg | ORAL_CAPSULE | Freq: Two times a day (BID) | ORAL | 6 refills | Status: DC

## 2022-08-31 NOTE — Telephone Encounter (Signed)
New rx sent to her pharmacy for duloxetine 20 mg BID

## 2022-09-12 IMAGING — MR MR CERVICAL SPINE W/O CM
5 series · 29 of 48 positions shown · non-contrast
Comparison: Cervical spine CT 09/06/2019

CLINICAL DATA: Neck pain radiating into the left arm. Evaluate for
Chiari malformation

EXAM:
MRI CERVICAL SPINE WITHOUT CONTRAST
TECHNIQUE: Multiplanar, multisequence MR imaging of the cervical spine was
performed. No intravenous contrast was administered.

[Series 5: T1 · sagittal · 3.0mm · 0.66mm/px · 6 of 15 slices shown]
[im 1/15]
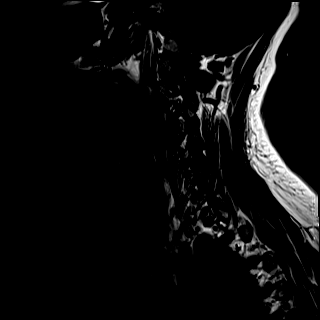
[im 3/15]
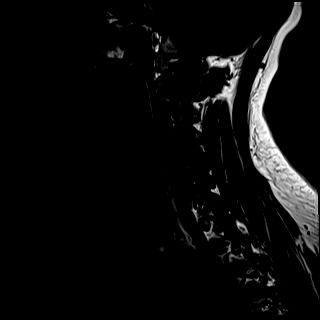
[im 6/15]
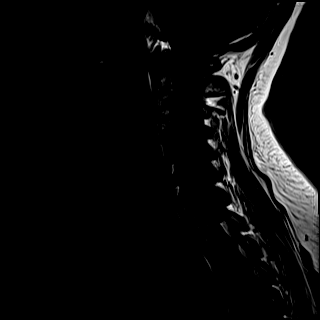
[im 9/15]
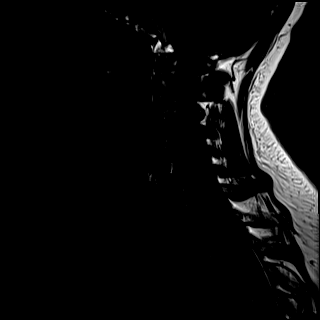
[im 12/15]
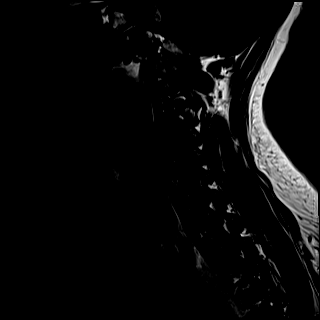
[im 15/15]
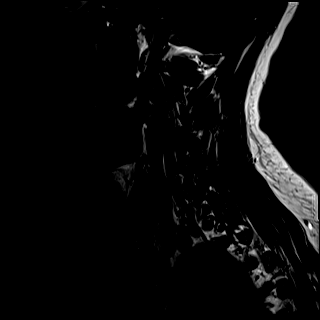

[Series 6: T2 · sagittal · 3.0mm · 0.55mm/px · 6 of 15 slices shown (1 of 2)]
[im 1/15]
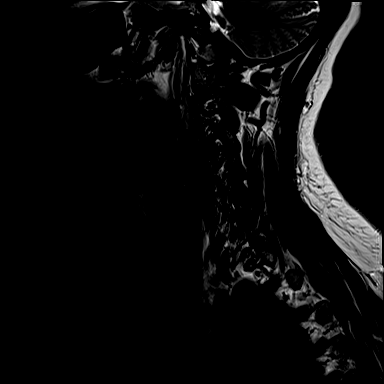
[im 3/15]
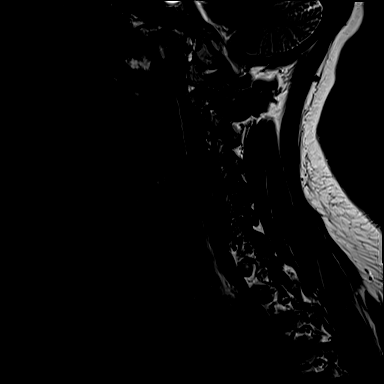
[im 6/15]
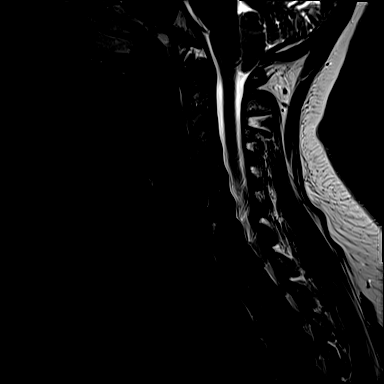
[im 9/15]
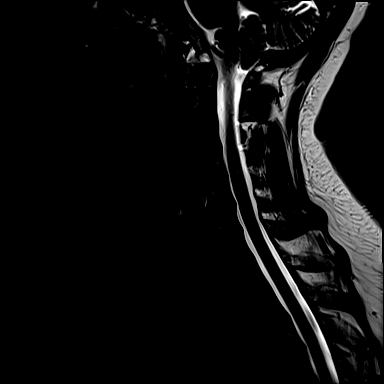
[im 12/15]
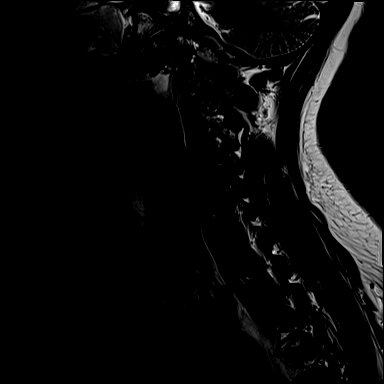
[im 15/15]
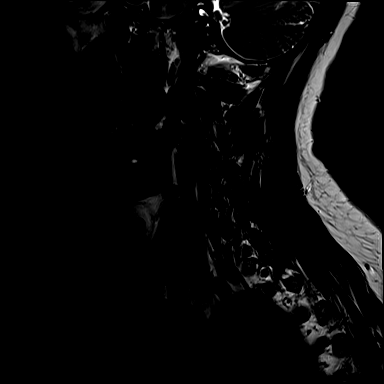

[Series 7: STIR · sagittal · 3.0mm · 0.33mm/px · 6 of 15 slices shown]
[im 1/15]
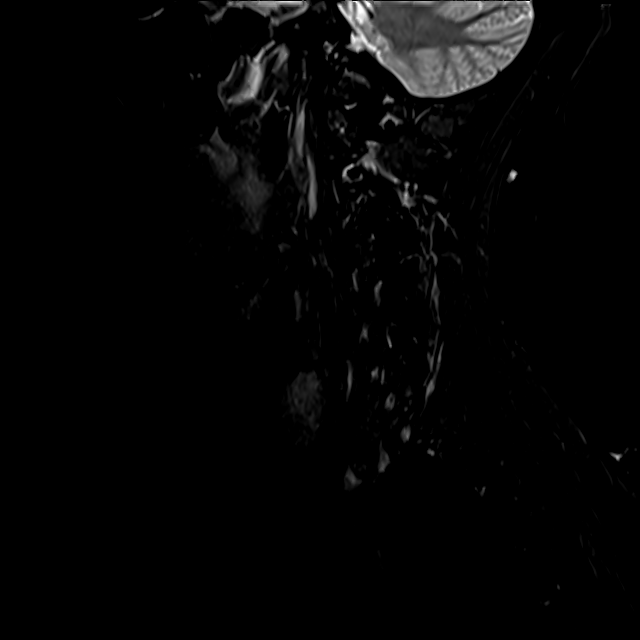
[im 3/15]
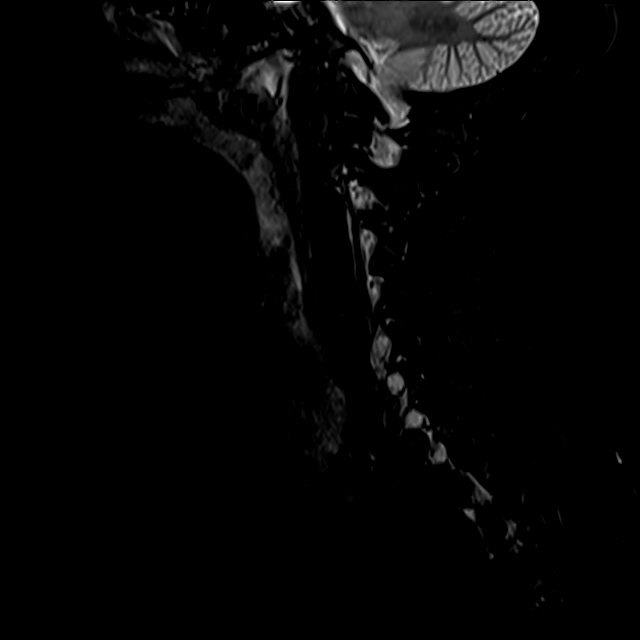
[im 6/15]
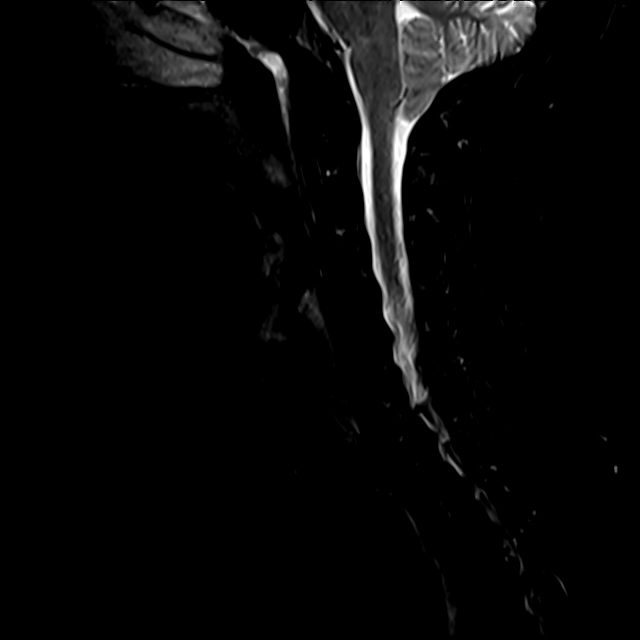
[im 9/15]
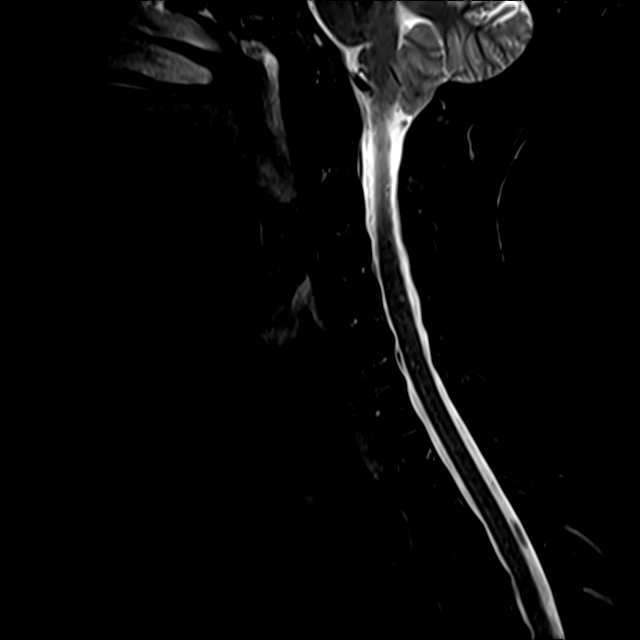
[im 12/15]
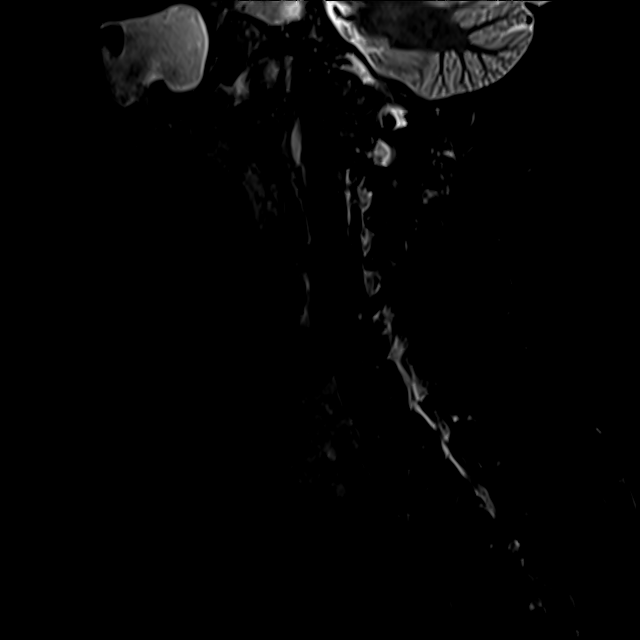
[im 15/15]
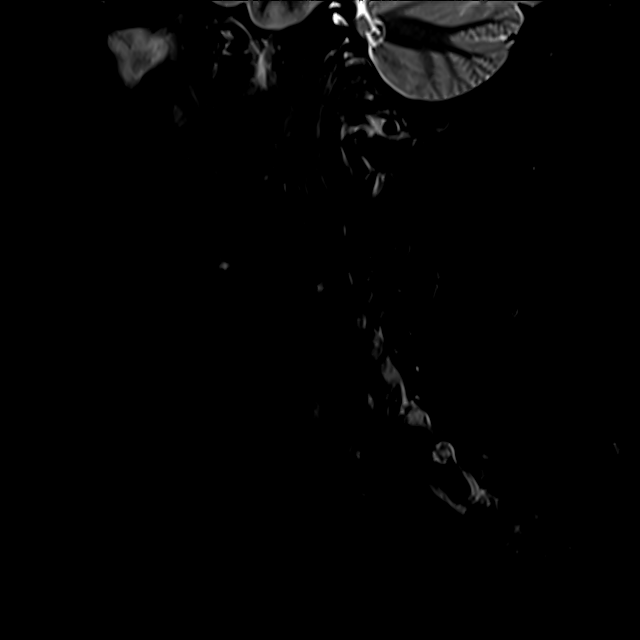

[Series 8: T2 · axial · 3.5mm · 0.50mm/px · z∈[-64,+68]mm · 9 of 36 slices shown (2 of 2)]
[im 1/36]
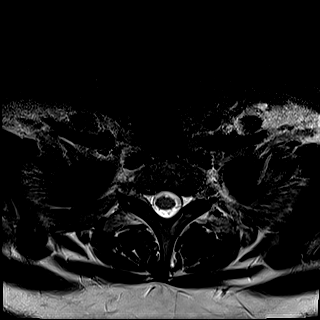
[im 6/36]
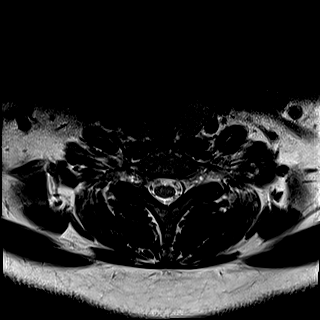
[im 11/36]
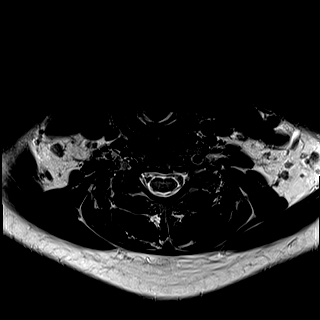
[im 16/36]
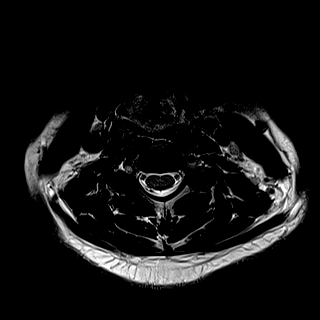
[im 18/36]
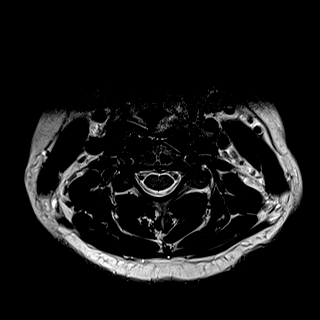
[im 21/36]
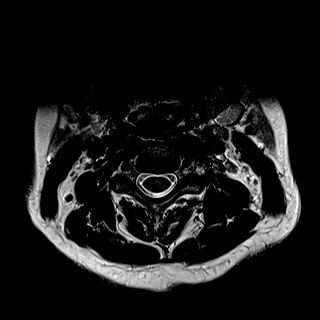
[im 26/36]
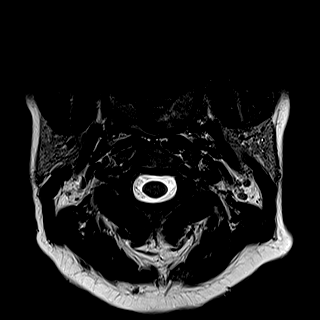
[im 31/36]
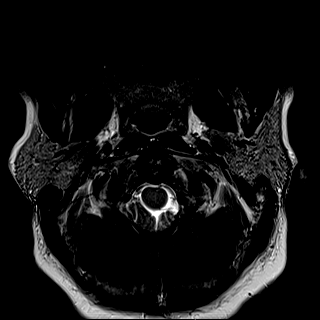
[im 36/36]
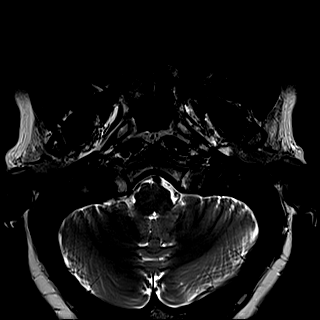

[Series 9: GRE · axial · 3.5mm · 0.42mm/px · z∈[-63,-45]mm · 2 of 36 slices shown]
[im 1/36]
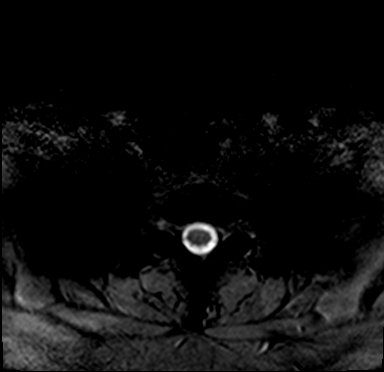
[im 6/36]
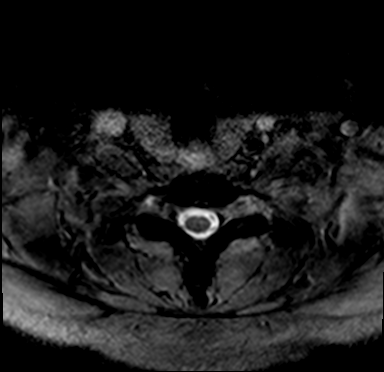

[29 of 48 positions shown; findings below may reference images not displayed]

FINDINGS: Alignment: Normal

Vertebrae: No fracture, evidence of discitis, or bone lesion.

Cord: Normal signal and morphology.  Negative for syrinx.

Posterior Fossa, vertebral arteries, paraspinal tissues: Cerebellar
tonsillar ectopia measuring up to 6 mm. The foramen magnum is patent
without cerebellar tonsillar flattening.

Left maxillary sinus opacification, long-standing based on head CT
from 8080.

Disc levels:

Diffusely preserved disc height and hydration with minor annulus
bulging at C5-6 and C6-7. Negative facets. No neural impingement.
IMPRESSION: 1. Cerebellar tonsillar ectopia (6 mm) but no tonsillar flattening
or foramen magnum CSF effacement typical of Chiari malformation.
2. No neural impingement or explanation for left arm symptoms.
3. Chronic left maxillary sinusitis.

## 2022-09-22 ENCOUNTER — Encounter: Payer: Self-pay | Admitting: Psychiatry

## 2022-09-22 ENCOUNTER — Other Ambulatory Visit: Payer: Self-pay

## 2022-09-22 MED ORDER — TIZANIDINE HCL 2 MG PO TABS: ORAL_TABLET | ORAL | 3 refills | Status: DC

## 2022-10-05 ENCOUNTER — Telehealth: Payer: Self-pay | Admitting: Nurse Practitioner

## 2022-10-05 ENCOUNTER — Encounter: Payer: Self-pay | Admitting: Nurse Practitioner

## 2022-10-05 NOTE — Telephone Encounter (Signed)
Called patient per 11/22 in basket message. Left voicemail.

## 2022-10-05 NOTE — Telephone Encounter (Signed)
Called to schedule 2x, no answer either time

## 2022-11-13 ENCOUNTER — Encounter (HOSPITAL_COMMUNITY): Payer: Self-pay

## 2022-11-13 ENCOUNTER — Emergency Department (HOSPITAL_COMMUNITY)
Admission: EM | Admit: 2022-11-13 | Discharge: 2022-11-13 | Disposition: A | Discharge status: Home / Self Care | Payer: BC Managed Care – PPO | Attending: Emergency Medicine | Admitting: Emergency Medicine

## 2022-11-13 ENCOUNTER — Other Ambulatory Visit: Payer: Self-pay

## 2022-11-13 ENCOUNTER — Emergency Department (HOSPITAL_COMMUNITY): Payer: BC Managed Care – PPO

## 2022-11-13 DIAGNOSIS — U071 COVID-19: Secondary | ICD-10-CM

## 2022-11-13 DIAGNOSIS — Z79899 Other long term (current) drug therapy: Secondary | ICD-10-CM | POA: Insufficient documentation

## 2022-11-13 DIAGNOSIS — I10 Essential (primary) hypertension: Secondary | ICD-10-CM | POA: Insufficient documentation

## 2022-11-13 DIAGNOSIS — J449 Chronic obstructive pulmonary disease, unspecified: Secondary | ICD-10-CM | POA: Diagnosis not present

## 2022-11-13 DIAGNOSIS — Z7982 Long term (current) use of aspirin: Secondary | ICD-10-CM | POA: Diagnosis not present

## 2022-11-13 DIAGNOSIS — R059 Cough, unspecified: Secondary | ICD-10-CM | POA: Diagnosis present

## 2022-11-13 LAB — RESP PANEL BY RT-PCR (RSV, FLU A&B, COVID)  RVPGX2
Influenza A by PCR: NEGATIVE
Influenza B by PCR: NEGATIVE
Resp Syncytial Virus by PCR: NEGATIVE
SARS Coronavirus 2 by RT PCR: POSITIVE — AB

## 2022-11-13 LAB — POC URINE PREG, ED: Preg Test, Ur: NEGATIVE

## 2022-11-13 MED ORDER — HYDROCOD POLI-CHLORPHE POLI ER 10-8 MG/5ML PO SUER: 5.0000 mL | Freq: Two times a day (BID) | ORAL | 0 refills | Status: DC | PRN

## 2022-11-13 MED ORDER — ACETAMINOPHEN 325 MG PO TABS
650.0000 mg | ORAL_TABLET | Freq: Once | ORAL | Status: AC
  Administered 2022-11-13: 650 mg via ORAL
  Filled 2022-11-13: qty 2

## 2022-11-13 MED ORDER — BENZONATATE 100 MG PO CAPS
200.0000 mg | ORAL_CAPSULE | Freq: Once | ORAL | Status: AC
  Administered 2022-11-13: 200 mg via ORAL
  Filled 2022-11-13: qty 2

## 2022-11-13 NOTE — Discharge Instructions (Addendum)
Rest and make sure you are drinking plenty of fluids.  I recommend Tylenol or Motrin for headache or fever.  You have been prescribed a cough medication, use caution as this medication can make you drowsy, do not drive within 4 hours of taking it.  Plan to get rechecked for any worsening symptoms, particularly weakness or shortness of breath.  You will need to stay home and quarantine for an additional 5 days or until your symptoms have resolved (fever).

## 2022-11-13 NOTE — ED Triage Notes (Signed)
Patient reports dry continuous cough with headache for the past two days. Complaining of chest wall pain with cough. OTC meds not helping.

## 2022-11-13 NOTE — ED Provider Notes (Signed)
[facility] EMERGENCY DEPARTMENT Provider Note   CSN: [id number] Arrival date & time: 11/13/22  0957     History  Chief Complaint  Patient presents with   Cough    Joann White is a 47 y.o. female with a history including hypertension, anemia, COPD, allergic rhinitis, sickle cell trait presenting for evaluation of a 2-day history of dry cough, subjective fever and generalized headache.  She also reports midsternal chest pain with coughing only, she does not have chest pain between episodes of coughing.  She denies shortness of breath, she does have a history of frequent wheezing associated with her COPD but has not had this symptom in the past 3 days.  She denies abdominal pain, nausea or vomiting, ear pain, she does have chronic rhinorrhea which she treats with Benadryl.  She has developed a sore throat since her cough began which she believes is secondary to irritation from the coughing itself.  She is not having difficulty swallowing.  She has tried multiple cough syrups without improvement in her symptoms.  States her husband had cold-like symptoms last week    .The history is provided by the patient.       Home Medications Prior to Admission medications   Medication Sig Start Date End Date Taking? Authorizing Provider  chlorpheniramine-HYDROcodone (TUSSIONEX) 10-8 MG/5ML Take 5 mLs by mouth every 12 (twelve) hours as needed for cough. 11/13/22  Yes Ruari Duggan, Almyra Free, PA-C  acetaminophen (TYLENOL) 500 MG tablet Take 500 mg by mouth every 6 (six) hours as needed.    [provider]  acetaZOLAMIDE (DIAMOX) 250 MG tablet Take 3 tablets (750 mg total) by mouth 2 (two) times daily. 04/29/22   Genia Harold, MD  albuterol (VENTOLIN HFA) 108 (90 Base) MCG/ACT inhaler Inhale 2 puffs into the lungs every 6 (six) hours as needed for wheezing. Reported on 11/14/2015 05/01/21   Lestine Box, PA-C  amLODipine (NORVASC) 10 MG tablet Take 5 mg by mouth daily. 09/10/21   [provider]  aspirin EC 81 MG tablet Take 81 mg by mouth daily.    [provider]  celecoxib (CELEBREX) 100 MG capsule Take 1 capsule (100 mg total) by mouth as needed (headaches). Take at onset of headache. Max dose 1 pill in 24 hours 05/12/22   Genia Harold, MD  celecoxib (CELEBREX) 100 MG capsule Take 100 mg by mouth 2 (two) times daily.    [provider]  diazepam (VALIUM) 5 MG tablet Take 30-min prior to MRI.  Do not drive while on this medication. Patient not taking: Reported on 06/08/2022 01/12/22   Narda Amber K, DO  diphenhydrAMINE (BENADRYL) 25 mg capsule Take 50 mg by mouth every 6 (six) hours as needed.    [provider]  DULoxetine (CYMBALTA) 20 MG capsule Take 1 capsule (20 mg total) by mouth 2 (two) times daily. 08/31/22   Genia Harold, MD  famotidine (PEPCID) 20 MG tablet Take 1 tablet by mouth as needed.    [provider]  ferrous sulfate 325 (65 FE) MG tablet Take 1 tablet by mouth every other day. Patient takes about 2 or 3 times per week due to GI upset.    [provider]  levothyroxine (SYNTHROID) 25 MCG tablet Take 25 mcg by mouth daily before breakfast.    [provider]  megestrol (MEGACE) 20 MG tablet Take 20 mg by mouth daily.    [provider]  metoprolol succinate (TOPROL-XL) 25 MG 24 hr tablet Take 0.5  tablets (12.5 mg total) by mouth daily. 07/13/22   Satira Sark, MD  naproxen sodium (ANAPROX) 550 MG tablet Take 550 mg by mouth 2 (two) times daily as needed. Patient not taking: Reported on 06/08/2022 08/14/21   [provider]  ondansetron (ZOFRAN-ODT) 4 MG disintegrating tablet as needed.    [provider]  tiZANidine (ZANAFLEX) 2 MG tablet TAKE 1 TABLET BY MOUTH EVERY 8 HOURS AS NEEDED FOR MUSCLE SPASM, NO MORE THAN ONCE DAILY 09/22/22   Genia Harold, MD      Allergies    Hydromorphone    Review of Systems   Review of Systems  Constitutional:  Positive for fever.  HENT:   Positive for rhinorrhea and sore throat. Negative for congestion.   Eyes: Negative.   Respiratory:  Positive for cough. Negative for chest tightness and shortness of breath.   Cardiovascular:  Negative for chest pain.  Gastrointestinal:  Negative for abdominal pain, nausea and vomiting.  Genitourinary: Negative.   Musculoskeletal:  Negative for arthralgias, joint swelling and neck pain.  Skin: Negative.  Negative for rash and wound.  Neurological:  Positive for headaches. Negative for dizziness, weakness, light-headedness and numbness.  Psychiatric/Behavioral: Negative.    All other systems reviewed and are negative.   Physical Exam Updated Vital Signs BP 133/85   Pulse (!) 112   Temp 98.4 F (36.9 C) (Oral)   Resp 20   Ht '5\' 6"'$  (1.676 m)   Wt 81.6 kg   SpO2 100%   BMI 29.05 kg/m  Physical Exam Vitals and nursing note reviewed.  Constitutional:      Appearance: She is well-developed.  HENT:     Head: Normocephalic and atraumatic.     Right Ear: Tympanic membrane normal.     Left Ear: Tympanic membrane normal.     Mouth/Throat:     Mouth: Mucous membranes are moist.     Pharynx: No posterior oropharyngeal erythema.  Eyes:     Conjunctiva/sclera: Conjunctivae normal.  Cardiovascular:     Rate and Rhythm: Regular rhythm. Tachycardia present.     Heart sounds: Normal heart sounds.  Pulmonary:     Effort: Pulmonary effort is normal.     Breath sounds: Normal breath sounds. No wheezing.     Comments: Course breath sounds noted bilaterally anterior chest.  No wheezing, rhonchi or rales. Abdominal:     General: Bowel sounds are normal.     Palpations: Abdomen is soft.     Tenderness: There is no abdominal tenderness.  Musculoskeletal:        General: Normal range of motion.     Cervical back: Normal range of motion.  Skin:    General: Skin is warm and dry.  Neurological:     General: No focal deficit present.     Mental Status: She is alert.     ED Results /  Procedures / Treatments   Labs (all labs ordered are listed, but only abnormal results are displayed) Labs Reviewed  RESP PANEL BY RT-PCR (RSV, FLU A&B, COVID)  [id number] - Abnormal; Notable for the following components:      Result Value   SARS Coronavirus 2 by RT PCR POSITIVE (*)    All other components within normal limits  POC URINE PREG, ED    EKG None  Radiology DG Chest Port 1 View  Result Date: 11/13/2022 CLINICAL DATA:  Cough. EXAM: PORTABLE CHEST 1 VIEW COMPARISON:  01/25/2022 FINDINGS: 1107 hours. The lungs are clear without focal  pneumonia, edema, pneumothorax or pleural effusion. The cardiopericardial silhouette is within normal limits for size. The visualized bony structures of the thorax are unremarkable. IMPRESSION: No active disease. Electronically Signed   By: Misty Stanley M.D.   On: 11/13/2022 11:22    Procedures Procedures    Medications Ordered in ED Medications  acetaminophen (TYLENOL) tablet 650 mg (650 mg Oral Given 11/13/22 1055)  benzonatate (TESSALON) capsule 200 mg (200 mg Oral Given 11/13/22 1055)    ED Course/ Medical Decision Making/ A&P                           Medical Decision Making Patient with a history of symptoms suggesting a viral upper respiratory infection.  Her respiratory panel is positive for COVID.  She states that she has received the first 2 COVID vaccines but no additional ones.  She was given Tylenol here for her headache which has improved, Tessalon which has not helped her cough.  I will prescribe Tussionex to help her with her cough symptom.  She was encouraged to rest, fluid intake, we discussed need for home quarantine for an additional 5 days or until symptoms are resolved, return precautions were outlined.  She has no hypoxia here.  Amount and/or Complexity of Data Reviewed Labs: ordered.    Details: Positive for COVID Radiology: ordered.    Details: Chest x-ray is clear, no pneumonia, agree with interpretation.  Risk OTC  drugs. Prescription drug management.           Final Clinical Impression(s) / ED Diagnoses Final diagnoses:  [id number]    Rx / DC Orders ED Discharge Orders          Ordered    chlorpheniramine-HYDROcodone (TUSSIONEX) 10-8 MG/5ML  Every 12 hours PRN        11/13/22 1156              Evalee Jefferson, PA-C 11/13/22 1157    Godfrey Pick, MD 11/17/22 1147

## 2022-12-26 ENCOUNTER — Other Ambulatory Visit: Payer: Self-pay | Admitting: Psychiatry

## 2022-12-27 NOTE — Telephone Encounter (Signed)
Last seen on 06/08/22 Follow up scheduled on 01/10/23 30 tablet last filled on 12/19/22

## 2023-01-10 ENCOUNTER — Telehealth: Payer: Self-pay | Admitting: Psychiatry

## 2023-01-10 ENCOUNTER — Encounter: Payer: Self-pay | Admitting: Psychiatry

## 2023-01-10 ENCOUNTER — Ambulatory Visit: Payer: BC Managed Care – PPO | Admitting: Psychiatry

## 2023-01-10 NOTE — Telephone Encounter (Signed)
Pt cancelled appt. Pt declined to give reason for cancellation. Pt stated will send a MyChart message to explain why cancelled appt.

## 2023-01-10 NOTE — Progress Notes (Deleted)
   CC:  headaches  Follow-up Visit  Last visit: 06/08/22  Brief HPI: 47 year old female with a history of Bells palsy (left) 2018, Chiari malformation who follows in clinic for headaches. MRI brain 07/14/21 showed an 8 mm Chiari malformation and partially empty sella. CTV was unremarkable. 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  At her last visit she was continued on Diamox and Cymbalta was added for prevention. She was referred to [facility] for second opinion.  Interval History: She saw [facility] in January who referred her for second opinion from Ophthalmology. Ophtho exam 11/23/22 was negative for papilledema. Repeat MRI 12/20/22 re-demonstrated her Chiari 1 malformation.   Headache days per month: *** Migraine days per month*** Headache free days per month: ***  Current Headache Regimen: Preventative: *** Abortive: ***   Prior Therapies                                  Prevention: Topamax - paresthesias Metoprolol Cymbalta Citalopram Diamox Qulipta - constipation  Rescue: Imitrex Maxalt Ubrelvy Nurtec - helped but was not covered by insurance (too many headache days per month) Diclofenac - stomach upset Celebrex  Physical Exam:   Vital Signs: There were no vitals taken for this visit. GENERAL:  well appearing, in no acute distress, alert  SKIN:  Color, texture, turgor normal. No rashes or lesions HEAD:  Normocephalic/atraumatic. RESP: normal respiratory effort MSK:  No gross joint deformities.   NEUROLOGICAL: Mental Status: Alert, oriented to person, place and time, Follows commands, and Speech fluent and appropriate. Cranial Nerves: PERRL, face symmetric, no dysarthria, hearing grossly intact Motor: moves all extremities equally Gait: normal-based.  IMPRESSION: ***  PLAN: ***   Follow-up: ***  I spent a total of *** minutes on the date of the service. Headache education was done.  Discussed lifestyle modification including increased oral hydration, decreased caffeine, exercise and stress management. Discussed treatment options including preventive and acute medications, natural supplements, and infusion therapy. Discussed medication overuse headache and to limit use of acute treatments to no more than 2 days/week or 10 days/month. Discussed medication side effects, adverse reactions and drug interactions. Written educational materials and patient instructions outlining all of the above were given.  Genia Harold, MD

## 2023-01-28 ENCOUNTER — Encounter: Payer: Self-pay | Admitting: *Deleted

## 2023-03-12 ENCOUNTER — Other Ambulatory Visit: Payer: Self-pay | Admitting: Psychiatry

## 2023-03-15 NOTE — Telephone Encounter (Signed)
Last seen on 06/08/22 "per note -Start Cymbalta 20 mg daily " No 7 month follow up scheduled  Last filled on 03/01/23 #60 tablets (30 day supply)

## 2023-04-11 ENCOUNTER — Ambulatory Visit: Payer: BC Managed Care – PPO | Admitting: Internal Medicine

## 2023-04-11 ENCOUNTER — Encounter: Payer: Self-pay | Admitting: Internal Medicine

## 2023-04-11 VITALS — BP 114/72 | HR 70 | Ht 66.0 in | Wt 194.0 lb

## 2023-04-11 DIAGNOSIS — E042 Nontoxic multinodular goiter: Secondary | ICD-10-CM

## 2023-04-11 DIAGNOSIS — E876 Hypokalemia: Secondary | ICD-10-CM | POA: Diagnosis not present

## 2023-04-11 DIAGNOSIS — L680 Hirsutism: Secondary | ICD-10-CM

## 2023-04-11 DIAGNOSIS — E236 Other disorders of pituitary gland: Secondary | ICD-10-CM | POA: Diagnosis not present

## 2023-04-11 LAB — FOLLICLE STIMULATING HORMONE: FSH: 16.8 m[IU]/mL

## 2023-04-11 LAB — BASIC METABOLIC PANEL
BUN: 10 mg/dL (ref 6–23)
CO2: 21 mEq/L (ref 19–32)
Calcium: 9.4 mg/dL (ref 8.4–10.5)
Chloride: 105 mEq/L (ref 96–112)
Creatinine, Ser: 0.86 mg/dL (ref 0.40–1.20)
GFR: 80.78 mL/min (ref >60.00)
Glucose, Bld: 100 mg/dL — ABNORMAL HIGH (ref 70–99)
Potassium: 3.3 mEq/L — ABNORMAL LOW (ref 3.5–5.1)
Sodium: 138 mEq/L (ref 135–145)

## 2023-04-11 LAB — TSH: TSH: 2.84 u[IU]/mL (ref 0.35–5.50)

## 2023-04-11 LAB — CORTISOL: Cortisol, Plasma: 2.6 ug/dL

## 2023-04-11 LAB — T4, FREE: Free T4: 0.6 ng/dL (ref 0.60–1.60)

## 2023-04-11 LAB — LUTEINIZING HORMONE: LH: 14.33 m[IU]/mL

## 2023-04-11 NOTE — Patient Instructions (Signed)

## 2023-04-11 NOTE — Progress Notes (Unsigned)
Name: Joann White  MRN/ DOB: [id number], 04-Nov-1976    Age/ Sex: 47 y.o., female    PCP: Donetta Potts, MD   Reason for Endocrinology Evaluation: Multinodular goiter, partial empty sella     Date of Initial Endocrinology Evaluation: 04/11/2023     HPI: Joann White is a 47 y.o. female with a past medical history of chronic headaches, Chiari malformations, hypothyroidism. The patient presented for initial endocrinology clinic visit on 04/11/2023 for consultative assistance with her multinodular goiter/partial empty sella.   Patient had an incidental finding of partial empty sella on CT imaging of the head 01/25/2022  She was also noted with multinodular goiter on thyroid ultrasound 07/2022, none of the nodules meet criteria for FNA no follow-up   Labs in August 2023 showed a TSH of 8.61 u IU/mL, she was started on levothyroxine by her PCP  Prolactin has been normal 06/2022  Patient follows with gynecology for irregular menstruation and uterine leiomyoma, she is on Norethindrone 0.35 mg daily , continues with irregularity but not as heavy   She is also under the care of of neurology for chronic Tension headaches  Has occasional local neck swelling  No Biotin  Denies palpitations recently  Has tremors  Has chronic constipation  Has chronic right nipple discharge , up to date on mammograms  Menarche at age 20 , menstruations were regular until ~ 10 yrs ago  Has noted weight gain after tobacco cessation 12/2022 Has chronic hirsutism since childhood , FH of hirsutism    G3, P 1 one miscarriage and one abortion   Son is 39 , believes galactorrhea started ~ 5 yrs after his birth  Has noted visual changes, past due on eye exams     Maternal GF and mother with thyroid disease     HISTORY:  Past Medical History:  Past Medical History:  Diagnosis Date   Allergic rhinitis    Anemia    blood transfusion on 04/09/2020   Anxiety 2022   Arnold-Chiari malformation,  type I (HCC)    Bell's palsy 11/12/2017   Chickenpox    Chronic daily headache    COPD (chronic obstructive pulmonary disease) (HCC)    mild   Depression 2022   Essential hypertension    History of blood transfusion 04/09/2020   Low back pain 2020   low back pain   Menorrhagia 2022   Palpitations 10/29/2021   Cardiologist Dr. Elita Quick, LOV 10/29/21. Patient wore a long term heart monitor that demonstrated rare PACs and PVCs and a limited episode of junctional tachycardia. No treatment required unless palpitations were bothersome. Patient prescribed Metoprol 25 mg 24 hour 1/2 tablet daily.   Paresthesia 11/2021   Patient saw neurologist, Dr. Nita Sickle on 12/04/21 (see MD  note in Epic) for parathesia and whole body pain. On 01/05/22 the patient had a NCW w/ EMG that was normal.   Sciatica 2020   chronic low back pain with sciatica   Sickle cell trait [facility])    Uterine leiomyoma    Past Surgical History:  Past Surgical History:  Procedure Laterality Date   BRONCHOSCOPY     around 2009 per pt, pt had right-sided chest pain, pt stated results were normal    Social History:  reports that she has quit smoking. Her smoking use included cigarettes. She has a 25.00 pack-year smoking history. She has never used smokeless tobacco. She reports current alcohol use. She reports current drug use. Drug: Marijuana.  Family History: family history includes Alcoholism in her father; Bronchitis in her mother; COPD in her mother; Cancer in her father, paternal aunt, and paternal aunt; Hypertension in her mother; Lung cancer in her father; Sickle cell anemia in her brother.   HOME MEDICATIONS: Allergies as of 04/11/2023       Reactions   Hydromorphone Nausea And Vomiting, Other (See Comments)   Dizziness         Medication List        Accurate as of April 11, 2023  8:37 AM. If you have any questions, ask your nurse or doctor.          STOP taking these medications    CeleBREX 100 MG  capsule Generic drug: celecoxib Stopped by: Scarlette Shorts, MD   celecoxib 100 MG capsule Commonly known as: CeleBREX Stopped by: Scarlette Shorts, MD   chlorpheniramine-HYDROcodone 10-8 MG/5ML Commonly known as: TUSSIONEX Stopped by: Scarlette Shorts, MD   diazepam 5 MG tablet Commonly known as: Valium Stopped by: Scarlette Shorts, MD   famotidine 20 MG tablet Commonly known as: PEPCID Stopped by: Scarlette Shorts, MD   ferrous sulfate 325 (65 FE) MG tablet Stopped by: Scarlette Shorts, MD   megestrol 20 MG tablet Commonly known as: MEGACE Stopped by: Scarlette Shorts, MD   metoprolol succinate 25 MG 24 hr tablet Commonly known as: TOPROL-XL Stopped by: Scarlette Shorts, MD   naproxen sodium 550 MG tablet Commonly known as: ANAPROX Stopped by: Scarlette Shorts, MD       TAKE these medications    acetaminophen 500 MG tablet Commonly known as: TYLENOL Take 500 mg by mouth every 6 (six) hours as needed.   acetaZOLAMIDE 250 MG tablet Commonly known as: DIAMOX Take 3 tablets (750 mg total) by mouth 2 (two) times daily.   albuterol 108 (90 Base) MCG/ACT inhaler Commonly known as: VENTOLIN HFA Inhale 2 puffs into the lungs every 6 (six) hours as needed for wheezing. Reported on 11/14/2015   amLODipine 10 MG tablet Commonly known as: NORVASC Take 5 mg by mouth daily.   aspirin EC 81 MG tablet Take 81 mg by mouth daily.   diphenhydrAMINE 25 mg capsule Commonly known as: BENADRYL Take 50 mg by mouth every 6 (six) hours as needed.   DULoxetine 20 MG capsule Commonly known as: Cymbalta Take 1 capsule (20 mg total) by mouth 2 (two) times daily.   levothyroxine 25 MCG tablet Commonly known as: SYNTHROID Take 25 mcg by mouth daily before breakfast.   norethindrone 0.35 MG tablet Commonly known as: MICRONOR Take 1 tablet by mouth daily.   ondansetron 4 MG disintegrating tablet Commonly known as: ZOFRAN-ODT as needed.    tiZANidine 2 MG tablet Commonly known as: ZANAFLEX TAKE 1 TABLET BY MOUTH EVERY 8 HOURS AS NEEDED FOR MUSCLE SPASM, NO MORE THAN ONCE DAILY          REVIEW OF SYSTEMS: A comprehensive ROS was conducted with the patient and is negative except as per HPI     OBJECTIVE:  VS: BP 114/72 (BP Location: Left Arm, Patient Position: Sitting, Cuff Size: Large)   Pulse 70   Ht 5\' 6"  (1.676 m)   Wt 194 lb (88 kg)   SpO2 95%   BMI 31.31 kg/m    Wt Readings from Last 3 Encounters:  04/11/23 194 lb (88 kg)  11/13/22 180 lb (81.6 kg)  06/08/22 182 lb 6 oz (82.7 kg)  EXAM: General: Pt appears well and is in NAD Hirsutism noted over cheek area, chin and upper lip  Eyes: External eye exam normal without stare  Neck: General: Supple without adenopathy. Thyroid:  No goiter or nodules appreciated.   Lungs: Clear with good BS bilat   Heart: Auscultation: RRR.  Abdomen: Soft, nontender  Extremities:  BL LE: No pretibial edema   Mental Status: Judgment, insight: Intact Orientation: Oriented to time, place, and person Mood and affect: No depression, anxiety, or agitation     DATA REVIEWED:  ****   Thyroid ultrasound 07/13/2022 Estimated total number of nodules >/= 1 cm: 1   Number of spongiform nodules >/=  2 cm not described below (TR1): 0   Number of mixed cystic and solid nodules >/= 1.5 cm not described below (TR2): 0   _________________________________________________________   Nodule # 1:   Location: Right; inferior   Maximum size: 1.1 cm; Other 2 dimensions: 0.5 x 1.0 cm   Composition: solid/almost completely solid (2)   Echogenicity: isoechoic (1)   Shape: not taller-than-wide (0)   Margins: ill-defined (0)   Echogenic foci: none (0)   ACR TI-RADS total points: 3.   ACR TI-RADS risk category: TR3 (3 points).   ACR TI-RADS recommendations:   Given size (<1.4 cm) and appearance, this nodule does NOT meet TI-RADS criteria for biopsy or dedicated  follow-up.   _________________________________________________________   Remaining subcentimeter bilateral thyroid nodules do not meet criteria for FNA or imaging surveillance.   IMPRESSION: Bilateral thyroid nodules measuring up to 1.1 cm, which do not meet criteria for FNA or imaging surveillance.   The above is in keeping with the ACR TI-RADS recommendations - J Am Coll Radiol 2017;14:587-595.    Old records , labs and images have been reviewed.    ASSESSMENT/PLAN/RECOMMENDATIONS:   Multinodular goiter  -Occasional local neck swelling -Thyroid ultrasound showed multinodular goiter, but none of the nodules meet criteria for a biopsy or follow-up -Reassurance provided   2.  Hypothyroidism:   -- Pt educated extensively on the correct way to take levothyroxine (first thing in the morning with water, 30 minutes before eating or taking other medications). - Pt encouraged to double dose the following day if she were to miss a dose given long half-life of levothyroxine.   Medications : Levothyroxine 25 mcg daily  2.  Partial empty sella:   - This was an incidental finding on CT scan of the head during evaluation of chronic headaches 01/25/2022 -Will proceed with pituitary hormonal checkup     Follow-up in 6 months  Signed electronically by: Lyndle Herrlich, MD  [facility] Endocr[facility]  [facility] [address] [place] [place], [place] [address] Phone: [phone] FAX: [phone]   CC: Donetta Potts, MD [address] [place] [address] Phone: [phone] Fax: [phone]   Return to Endocrinology clinic as below: No future appointments.

## 2023-04-12 MED ORDER — POTASSIUM CHLORIDE ER 10 MEQ PO TBCR: 10.0000 meq | EXTENDED_RELEASE_TABLET | Freq: Every day | ORAL | 0 refills | Status: DC

## 2023-04-12 MED ORDER — LEVOTHYROXINE SODIUM 25 MCG PO TABS: 25.0000 ug | ORAL_TABLET | Freq: Every day | ORAL | 3 refills | Status: DC

## 2023-04-16 LAB — TESTOSTERONE, TOTAL, LC/MS/MS: Testosterone, Total, LC-MS-MS: 16 ng/dL (ref 2–45)

## 2023-04-16 LAB — ESTRADIOL: Estradiol: 39 pg/mL

## 2023-04-16 LAB — ACTH: C206 ACTH: 7 pg/mL (ref 6–50)

## 2023-04-16 LAB — PROLACTIN: Prolactin: 7.3 ng/mL

## 2023-04-16 LAB — 17-HYDROXYPROGESTERONE: 17-OH-Progesterone, LC/MS/MS: 17 ng/dL

## 2023-04-16 LAB — INSULIN-LIKE GROWTH FACTOR
IGF-I, LC/MS: 88 ng/mL (ref 52–328)
Z-Score (Female): -1 SD (ref ?–2.0)

## 2023-05-08 ENCOUNTER — Other Ambulatory Visit: Payer: Self-pay | Admitting: Internal Medicine

## 2023-06-11 ENCOUNTER — Other Ambulatory Visit: Payer: Self-pay | Admitting: Internal Medicine

## 2023-07-15 ENCOUNTER — Encounter: Payer: Self-pay | Admitting: *Deleted

## 2023-07-21 ENCOUNTER — Other Ambulatory Visit: Payer: Self-pay | Admitting: Psychiatry

## 2023-07-21 NOTE — Telephone Encounter (Signed)
Last seen on 06/08/22 No follow up scheduled

## 2023-10-05 ENCOUNTER — Encounter: Payer: Self-pay | Admitting: Nurse Practitioner

## 2023-10-11 ENCOUNTER — Encounter: Payer: Self-pay | Admitting: Nurse Practitioner

## 2023-10-12 ENCOUNTER — Ambulatory Visit: Payer: BC Managed Care – PPO | Admitting: Internal Medicine

## 2023-10-12 NOTE — Progress Notes (Unsigned)
Name: Joann White  MRN/ DOB: [id number], 17-Apr-1976    Age/ Sex: 47 y.o., female    PCP: Donetta Potts, MD   Reason for Endocrinology Evaluation: Multinodular goiter, partial empty sella     Date of Initial Endocrinology Evaluation: 04/11/2023    HPI: Joann White is a 47 y.o. female with a past medical history of chronic headaches, Chiari malformations, hypothyroidism. The patient presented for initial endocrinology clinic visit on 04/11/2023 for consultative assistance with her multinodular goiter/partial empty sella.    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  She is on norethindrone 0.35 mg daily   HIRSUTISM HISTORY: This is chronic since childhood.  17 hydroxyprogesterone, testosterone have been normal   Maternal GF and mother with thyroid disease   SUBJECTIVE:    Today (10/12/23):  Joann White is here for follow-up on hypothyroid, empty sella turcica. Has occasional local neck swelling  No Biotin  Denies palpitations recently  Has tremors  Has chronic constipation  Has chronic right nipple discharge , up to date on mammograms  Menarche at age 45 , menstruations were regular until ~ 10 yrs ago  Has noted weight gain after tobacco  cessation 12/2022 Has chronic hirsutism since childhood , FH of hirsutism   HISTORY:  Past Medical History:  Past Medical History:  Diagnosis Date   Allergic rhinitis    Anemia    blood transfusion on 04/09/2020   Anxiety 2022   Arnold-Chiari malformation, type I (HCC)    Bell's palsy 11/12/2017   Chickenpox    Chronic daily headache    COPD (chronic obstructive pulmonary disease) (HCC)    mild   Depression 2022   Essential hypertension    History of blood transfusion 04/09/2020   Low back pain 2020   low back pain   Menorrhagia 2022   Palpitations 10/29/2021   Cardiologist Dr. Elita Quick, LOV 10/29/21. Patient wore a long term heart monitor that demonstrated rare PACs and PVCs and a limited episode of junctional tachycardia. No treatment required unless palpitations were bothersome. Patient prescribed Metoprol 25 mg 24 hour 1/2 tablet daily.   Paresthesia 11/2021   Patient saw neurologist, Dr. Nita Sickle on 12/04/21 (see MD  note in Epic) for parathesia and whole body pain. On 01/05/22 the patient had a NCW w/ EMG that was normal.   Sciatica 2020   chronic low back pain with sciatica   Sickle cell trait [facility])    Uterine leiomyoma    Past Surgical History:  Past Surgical History:  Procedure Laterality Date   BRONCHOSCOPY     around 2009 per pt, pt had right-sided chest pain, pt stated results were normal    Social History:  reports that she has quit smoking.  Her smoking use included cigarettes. She has a 25 pack-year smoking history. She has never used smokeless tobacco. She reports current alcohol use. She reports current drug use. Drug: Marijuana. Family History: family history includes Alcoholism in her father; Bronchitis in her mother; COPD in her mother; Cancer in her father, paternal aunt, and paternal aunt; Hypertension in her mother; Lung cancer in her father; Sickle cell anemia in her brother.   HOME MEDICATIONS: Allergies as of 10/12/2023       Reactions    Hydromorphone Nausea And Vomiting, Other (See Comments)   Dizziness         Medication List        Accurate as of October 12, 2023  7:08 AM. If you have any questions, ask your nurse or doctor.          acetaminophen 500 MG tablet Commonly known as: TYLENOL Take 500 mg by mouth every 6 (six) hours as needed.   acetaZOLAMIDE 250 MG tablet Commonly known as: DIAMOX Take 3 tablets (750 mg total) by mouth 2 (two) times daily.   albuterol 108 (90 Base) MCG/ACT inhaler Commonly known as: VENTOLIN HFA Inhale 2 puffs into the lungs every 6 (six) hours as needed for wheezing. Reported on 11/14/2015   amLODipine 10 MG tablet Commonly known as: NORVASC Take 5 mg by mouth daily.   aspirin EC 81 MG tablet Take 81 mg by mouth daily.   diphenhydrAMINE 25 mg capsule Commonly known as: BENADRYL Take 50 mg by mouth every 6 (six) hours as needed.   DULoxetine 20 MG capsule Commonly known as: Cymbalta Take 1 capsule (20 mg total) by mouth 2 (two) times daily.   levothyroxine 25 MCG tablet Commonly known as: SYNTHROID Take 1 tablet (25 mcg total) by mouth daily before breakfast.   norethindrone 0.35 MG tablet Commonly known as: MICRONOR Take 1 tablet by mouth daily.   ondansetron 4 MG disintegrating tablet Commonly known as: ZOFRAN-ODT as needed.   potassium chloride 10 MEQ tablet Commonly known as: KLOR-CON TAKE 1 TABLET(10 MEQ) BY MOUTH DAILY   tiZANidine 2 MG tablet Commonly known as: ZANAFLEX TAKE 1 TABLET BY MOUTH EVERY 8 HOURS AS NEEDED FOR MUSCLE SPASM, NO MORE THAN ONCE DAILY          REVIEW OF SYSTEMS: A comprehensive ROS was conducted with the patient and is negative except as per HPI     OBJECTIVE:  VS: There were no vitals taken for this visit.   Wt Readings from Last 3 Encounters:  04/11/23 194 lb (88 kg)  11/13/22 180 lb (81.6 kg)  06/08/22 182 lb 6 oz (82.7 kg)     EXAM: General: Pt appears well and is in NAD Hirsutism noted over cheek  area, chin and upper lip  Eyes: External eye exam normal without stare  Neck: General: Supple without adenopathy. Thyroid:  No goiter or nodules appreciated.   Lungs: Clear with good BS bilat   Heart: Auscultation: RRR.  Abdomen: Soft, nontender  Extremities:  BL LE: No pretibial edema   Mental Status: Judgment, insight: Intact Orientation: Oriented to time, place, and person Mood and affect: No depression, anxiety, or agitation     DATA REVIEWED:    Latest Reference Range & Units 04/11/23 09:14  Cortisol, Plasma ug/dL 2.6  LH mIU/mL 16.10  FSH mIU/ML 16.8  Prolactin ng/mL 7.3  Glucose 70 - 99 mg/dL 960 (H)  Estradiol pg/mL 39  TSH 0.35 - 5.50 uIU/mL 2.84  T4,Free(Direct) 0.60 -  1.60 ng/dL 3.01     Latest Reference Range & Units 04/11/23 09:14  Sodium 135 - 145 mEq/L 138  Potassium 3.5 - 5.1 mEq/L 3.3 (L)  Chloride 96 - 112 mEq/L 105  CO2 19 - 32 mEq/L 21  Glucose 70 - 99 mg/dL 601 (H)  BUN 6 - 23 mg/dL 10  Creatinine 0.93 - 2.35 mg/dL 5.73  Calcium 8.4 - 22.0 mg/dL 9.4  GFR >25.42 mL/min 80.78    Thyroid ultrasound 07/13/2022 Estimated total number of nodules >/= 1 cm: 1   Number of spongiform nodules >/=  2 cm not described below (TR1): 0   Number of mixed cystic and solid nodules >/= 1.5 cm not described below (TR2): 0   _________________________________________________________   Nodule # 1:   Location: Right; inferior   Maximum size: 1.1 cm; Other 2 dimensions: 0.5 x 1.0 cm   Composition: solid/almost completely solid (2)   Echogenicity: isoechoic (1)   Shape: not taller-than-wide (0)   Margins: ill-defined (0)   Echogenic foci: none (0)   ACR TI-RADS total points: 3.   ACR TI-RADS risk category: TR3 (3 points).   ACR TI-RADS recommendations:   Given size (<1.4 cm) and appearance, this nodule does NOT meet TI-RADS criteria for biopsy or dedicated follow-up.   _________________________________________________________   Remaining  subcentimeter bilateral thyroid nodules do not meet criteria for FNA or imaging surveillance.   IMPRESSION: Bilateral thyroid nodules measuring up to 1.1 cm, which do not meet criteria for FNA or imaging surveillance.   The above is in keeping with the ACR TI-RADS recommendations - J Am Coll Radiol 2017;14:587-595.    Old records , labs and images have been reviewed.    ASSESSMENT/PLAN/RECOMMENDATIONS:    1.  Hypothyroidism:   -- Pt educated extensively on the correct way to take levothyroxine (first thing in the morning with water, 30 minutes before eating or taking other medications). - Pt encouraged to double dose the following day if she were to miss a dose given long half-life of levothyroxine. -TFTs normal  Medications : Continue levothyroxine 25 mcg daily  2.  Partial empty sella:   - This was an incidental finding on CT scan of the head during evaluation of chronic headaches 01/25/2022 -All pituitary hormones have come back normal including IGF-1, ACTH, cortisol, LH, prolactin, FSH, and estradiol -No further workup is needed  3.  Hirsutism:  -This is chronic since childhood - testosterone, and 17-OH progesterone came back normal   5.  Hypokalemia  -We will replenish -Patient encouraged to consume a banana a day  Medication KCl 10 mill equivalent daily x 30 days     6.  Galactorrhea:  -Chronic and idiopathic as prolactin is normal   Follow-up in 6 months  Addendum: Labs discussed with the patient 04/12/2023, patient confirmed exogenous glucocorticoid intake  Signed electronically by: Lyndle Herrlich, MD  [facility]  [facility] [address] [place], [place] [address] Phone: [phone] FAX: [phone]   CC: Donetta Potts, MD [address] [place] [address] Phone: [phone] Fax: [phone]   Return to Endocrinology clinic as below: Future Appointments  Date Time Provider  Department Center  10/12/2023  9:50 AM Cannan Beeck, Konrad Dolores, MD [id number] None

## 2023-11-24 ENCOUNTER — Encounter: Payer: Self-pay | Admitting: Nurse Practitioner

## 2024-01-25 ENCOUNTER — Ambulatory Visit: Payer: BLUE CROSS/BLUE SHIELD | Admitting: Primary Care

## 2024-01-25 ENCOUNTER — Encounter: Payer: Self-pay | Admitting: Primary Care

## 2024-01-25 VITALS — BP 134/86 | HR 89 | Ht 66.0 in | Wt 201.4 lb

## 2024-01-25 DIAGNOSIS — J449 Chronic obstructive pulmonary disease, unspecified: Secondary | ICD-10-CM

## 2024-01-25 DIAGNOSIS — F5101 Primary insomnia: Secondary | ICD-10-CM | POA: Diagnosis not present

## 2024-01-25 DIAGNOSIS — R0683 Snoring: Secondary | ICD-10-CM | POA: Diagnosis not present

## 2024-01-25 DIAGNOSIS — J329 Chronic sinusitis, unspecified: Secondary | ICD-10-CM | POA: Diagnosis not present

## 2024-01-25 DIAGNOSIS — Z87891 Personal history of nicotine dependence: Secondary | ICD-10-CM

## 2024-01-25 MED ORDER — ZALEPLON 10 MG PO CAPS: 10.0000 mg | ORAL_CAPSULE | Freq: Every evening | ORAL | 0 refills | Status: DC | PRN

## 2024-01-25 NOTE — Progress Notes (Signed)
 @Patient  ID: Joann White, female    DOB: [birth year], 48 y.o.   MRN: [id number]  Chief Complaint  Patient presents with   Sleep Consult    Referred by Dr. Mitzi Hansen for possible OSA. Epworth=15.     Referring provider: Donetta Potts, MD  HPI: 48 year old female, former smoker. Medical history significant for hypertension, uterine fibroids, headache, anemia, chest pain.   01/25/2024 Discussed the use of AI scribe software for clinical note transcription with the patient, who gave verbal consent to proceed.  Joann White is a 48 year old female with insomnia and suspected sleep apnea who presents for a sleep consult. She was referred by her primary care doctor for evaluation of her sleep issues.  She experiences significant sleep disturbances characterized by waking up due to cessation of breathing, confirmed by her husband. This is distressing for her and disruptive to her partner. She also experiences snoring, waking up struggling to breathe, shortness of breath, choking, restless sleep, and daytime sleepiness. Her Epworth Sleepiness Scale score is 15, indicating excessive daytime sleepiness.  Her sleep pattern is irregular, with no typical bedtime. She attributes worsening sleep issues to increased anxiety and depression following her father's passing in 2020. Since then, her sleep pattern has deteriorated further, with periods of staying awake for 24 to 48 hours or more before being able to sleep. She finds it difficult to maintain a regular sleep schedule due to her work flexibility, which she appreciates given her disruptive sleep habits.  She has a history of trying various medications for sleep, including trazodone, which was increased from 50 mg to 150 mg without improvement, leading her to discontinue it. Over-the-counter options like Advil PM, Tylenol PM, ZzzQuil, and melatonin have also been ineffective.  She reports sinus issues since 2017. She has not found  relief with nasal sprays for her sinus issues, which include crusty buildup in her nose upon waking, a problem persisting for the last three months.  She has a history of mild COPD diagnosed in 2010, for which she uses a rescue inhaler approximately twice a week, with reduced frequency since quitting smoking last year. No alcohol use.  She underwent a home sleep study last year with [facility], which was inconclusive due to insufficient data.      Allergies  Allergen Reactions   Hydromorphone Nausea And Vomiting and Other (See Comments)    Dizziness      There is no immunization history on file for this patient.  Past Medical History:  Diagnosis Date   Allergic rhinitis    Anemia    blood transfusion on 04/09/2020   Anxiety 2022   Arnold-Chiari malformation, type I (HCC)    Bell's palsy 11/12/2017   Chickenpox    Chronic daily headache    COPD (chronic obstructive pulmonary disease) (HCC)    mild   Depression 2022   Essential hypertension    History of blood transfusion 04/09/2020   Low back pain 2020   low back pain   Menorrhagia 2022   Palpitations 10/29/2021   Cardiologist Dr. Elita Quick, LOV 10/29/21. Patient wore a long term heart monitor that demonstrated rare PACs and PVCs and a limited episode of junctional tachycardia. No treatment required unless palpitations were bothersome. Patient prescribed Metoprol 25 mg 24 hour 1/2 tablet daily.   Paresthesia 11/2021   Patient saw neurologist, Dr. Nita Sickle on 12/04/21 (see MD  note in Epic) for parathesia and whole body pain. On 01/05/22 the patient had  a NCW w/ EMG that was normal.   Sciatica 2020   chronic low back pain with sciatica   Sickle cell trait (HCC)    Uterine leiomyoma     Tobacco History: Social History   Tobacco Use  Smoking Status Former   Current packs/day: 2.00   Average packs/day: 2.0 packs/day for 30.0 years (60.0 ttl pk-yrs)   Types: Cigarettes  Smokeless Tobacco Never  Tobacco Comments    Quit 01-07-23   Counseling given: Not Answered Tobacco comments: Quit 01-07-23   Outpatient Medications Prior to Visit  Medication Sig Dispense Refill   acetaZOLAMIDE (DIAMOX) 250 MG tablet Take 3 tablets (750 mg total) by mouth 2 (two) times daily. (Patient taking differently: Take 250 mg by mouth 2 (two) times daily.) 180 tablet 6   albuterol (VENTOLIN HFA) 108 (90 Base) MCG/ACT inhaler Inhale 2 puffs into the lungs every 6 (six) hours as needed for wheezing. Reported on 11/14/2015 18 g 0   amLODipine (NORVASC) 10 MG tablet Take 5 mg by mouth daily.     aspirin EC 81 MG tablet Take 81 mg by mouth 2 (two) times a week.     diphenhydrAMINE (BENADRYL) 25 mg capsule Take 50 mg by mouth every 6 (six) hours as needed.     levothyroxine (SYNTHROID) 25 MCG tablet Take 1 tablet (25 mcg total) by mouth daily before breakfast. 90 tablet 3   ondansetron (ZOFRAN-ODT) 4 MG disintegrating tablet as needed.     tiZANidine (ZANAFLEX) 2 MG tablet TAKE 1 TABLET BY MOUTH EVERY 8 HOURS AS NEEDED FOR MUSCLE SPASM, NO MORE THAN ONCE DAILY 30 tablet 3   acetaminophen (TYLENOL) 500 MG tablet Take 500 mg by mouth every 6 (six) hours as needed.     DULoxetine (CYMBALTA) 20 MG capsule Take 1 capsule (20 mg total) by mouth 2 (two) times daily. 60 capsule 6   norethindrone (MICRONOR) 0.35 MG tablet Take 1 tablet by mouth daily.     potassium chloride (KLOR-CON) 10 MEQ tablet TAKE 1 TABLET(10 MEQ) BY MOUTH DAILY 30 tablet 0   No facility-administered medications prior to visit.      Review of Systems  Review of Systems  Constitutional:  Positive for fatigue.  HENT:  Positive for postnasal drip.   Respiratory:  Positive for apnea.   Neurological:  Positive for headaches.  Psychiatric/Behavioral:  Positive for sleep disturbance.    Physical Exam  BP 134/86 (BP Location: Left Arm, Cuff Size: Normal)   Pulse 89   Ht 5\' 6"  (1.676 m)   Wt 201 lb 6.4 oz (91.4 kg)   SpO2 100%   BMI 32.51 kg/m  Physical  Exam Constitutional:      General: She is not in acute distress.    Appearance: Normal appearance. She is obese. She is not ill-appearing.  HENT:     Head: Normocephalic and atraumatic.  Cardiovascular:     Rate and Rhythm: Normal rate and regular rhythm.  Pulmonary:     Effort: Pulmonary effort is normal.     Breath sounds: Normal breath sounds. No wheezing, rhonchi or rales.  Skin:    General: Skin is warm and dry.  Neurological:     General: No focal deficit present.     Mental Status: She is alert and oriented to person, place, and time. Mental status is at baseline.  Psychiatric:        Mood and Affect: Mood normal.        Behavior: Behavior normal.  Thought Content: Thought content normal.        Judgment: Judgment normal.      Lab Results:  CBC    Component Value Date/Time   WBC 9.9 01/25/2022 1437   WBC 9.7 01/25/2022 1230   RBC 4.53 01/25/2022 1437   HGB 11.1 (L) 01/25/2022 1437   HGB 11.2 (L) 01/25/2022 1230   HGB 11.4 05/04/2017 1557   HCT 34.4 (L) 01/25/2022 1437   HCT 35.0 05/04/2017 1557   PLT 575 (H) 01/25/2022 1437   PLT 587 (H) 01/25/2022 1230   PLT 262 05/04/2017 1557   MCV 75.9 (L) 01/25/2022 1437   MCV 75 (L) 05/04/2017 1557   MCV 80 04/19/2013 0803   MCH 24.5 (L) 01/25/2022 1437   MCHC 32.3 01/25/2022 1437   RDW 23.1 (H) 01/25/2022 1437   RDW 19.5 (H) 05/04/2017 1557   RDW 15.7 (H) 04/19/2013 0803   LYMPHSABS 1.8 01/19/2021 2237   MONOABS 1.0 01/19/2021 2237   EOSABS 0.1 01/19/2021 2237   BASOSABS 0.0 01/19/2021 2237    BMET    Component Value Date/Time   NA 138 04/11/2023 0914   NA 137 04/19/2013 0803   K 3.3 (L) 04/11/2023 0914   K 3.6 04/19/2013 0803   CL 105 04/11/2023 0914   CL 104 04/19/2013 0803   CO2 21 04/11/2023 0914   CO2 28 04/19/2013 0803   GLUCOSE 100 (H) 04/11/2023 0914   GLUCOSE 109 (H) 04/19/2013 0803   BUN 10 04/11/2023 0914   BUN 7 04/19/2013 0803   CREATININE 0.86 04/11/2023 0914   CREATININE 0.89  04/19/2013 0803   CALCIUM 9.4 04/11/2023 0914   CALCIUM 9.6 04/19/2013 0803   GFRNONAA >60 01/25/2022 1437   GFRNONAA >60 04/19/2013 0803   GFRAA >60 04/08/2020 2222   GFRAA >60 04/19/2013 0803    BNP No results found for: "BNP"  ProBNP    Component Value Date/Time   PROBNP 17.2 08/26/2014 1501    Imaging: No results found.   Assessment & Plan:   1. Loud snoring (Primary) - Home sleep test; Future  2. Primary insomnia      Suspected Obstructive Sleep Apnea (OSA) Patient has symptoms of loud snoring, sleep disruption, waking up gasping/choking and daytime sleepiness. Epworth score 15/24. BMI 32. Previous home sleep study with [facility] inconclusive. OSA may contribute to insomnia. Reviewed risk for cardiac arrhythmias, stroke, diabetes, and pulmonary hypertension with un treated sleep apnea and treatment options including weight loss, oral appliance, CPAP or referral to ENT  - Order home sleep study through [facility] for 3 nights  - Advise to avoid supine sleeping and driving while tired  Insomnia Chronic insomnia exacerbated by anxiety and depression. Previous treatments ineffective. Insomnia affecting her cognition/memory, work, and quality of life. Sonata proposed for short-term management. Cognitive behavioral therapy recommended for long-term. - Prescribe Sonata 10mg  QHS, take 30 minutes before bedtime. - Recommend cognitive behavioral therapy for sleep training. - Advise on sleep hygiene: regular bedtime, avoid screens, cool/dark environment, avoid caffeine/heavy meals. - Instruct to get out of bed if unable to sleep within 20-30 minutes, engage in light activities. - Suggest sound machines or meditative apps. - Avoid combining Sonata with tizanidine or Benadryl.  Chronic Obstructive Pulmonary Disease (COPD) Mild COPD, former smoker, improved symptoms post-smoking cessation. No need for daily maintenance inhaler. - Continue Albuterol rescue inhaler as needed,  approximately twice a week.  Chronic Sinusitis Chronic sinus issues with nasal crusting and drainage - Recommend saline nasal spray or  AYR nasal gel before bed.      Glenford Bayley, NP 01/25/2024

## 2024-01-25 NOTE — Patient Instructions (Addendum)
 Today, we discussed your ongoing sleep issues, including insomnia and suspected obstructive sleep apnea (OSA). We reviewed your history of sleep disturbances, including waking up due to cessation of breathing, snoring, and daytime sleepiness. We also addressed your chronic sinus issues and mild COPD. A plan was created to help manage these conditions and improve your overall sleep quality.  YOUR PLAN:  -SUSPECTED OBSTRUCTIVE SLEEP APNEA (OSA): Obstructive Sleep Apnea (OSA) is a condition where the airway becomes blocked during sleep, causing breathing to stop and start repeatedly. We will conduct a home sleep study over multiple nights to gather more data. In the meantime, avoid sleeping on your back. Potential treatments include CPAP therapy, weight loss, and oral appliances.  -INSOMNIA: Insomnia is difficulty falling or staying asleep, often worsened by anxiety and depression. We will start you on Sonata, to be taken 30 minutes before bedtime, and recommend cognitive behavioral therapy for long-term management. Follow sleep hygiene practices such as maintaining a regular bedtime, avoiding screens before bed, and keeping your sleep environment cool and dark. If you can't sleep within 20-30 minutes, get out of bed and do a light activity. Consider using sound machines or meditative apps, and avoid combining Sonata with tizanidine or Benadryl.  [facility]- you do not need a referral, you can reach out to them to make an apt for sleep training   [address] [place], [place] [address]  PHONE: [phone] FAX: [phone]  -CHRONIC OBSTRUCTIVE PULMONARY DISEASE (COPD): COPD is a chronic lung condition that makes it hard to breathe. Your symptoms have improved since you quit smoking, and you only need to use your rescue inhaler as needed, about twice a week.  -CHRONIC SINUSITIS: Chronic sinusitis is long-term inflammation of the sinuses, causing nasal  crusting and drainage issues. Use a saline nasal spray or AYR nasal gel before bed to help alleviate these symptoms.  INSTRUCTIONS: Please complete the home sleep study as ordered. Follow the recommendations for managing your insomnia and sinus issues. Continue using your rescue inhaler for COPD as needed. Schedule a follow-up appointment to review the results of your sleep study and discuss further treatment options.  Follow-up 6-8 week follow-up with Beth NP to review sleep study results and insomnia      Zaleplon Capsules What is this medication? ZALEPLON (ZAL e plon) treats insomnia. It helps you go to sleep faster. It is often used for a short time. This medicine may be used for other purposes; ask your health care provider or pharmacist if you have questions. COMMON BRAND NAME(S): Sonata What should I tell my care team before I take this medication? They need to know if you have any of these conditions: Depression Liver disease Lung or breathing disease Substance use disorder Suicidal thoughts, plans, or attempt by you or a family member Unusual sleep behaviors or activities you do not remember An unusual or allergic reaction to zaleplon, other medications, foods, dyes, or preservatives Pregnant or trying to get pregnant Breastfeeding How should I use this medication? Take this medication by mouth with water. Take it as directed on the label. Do not use it more often than directed. There may be unused or extra doses in the bottle after you finish your treatment. Talk to your care team if you have questions about your dose. A special MedGuide will be given to you by the pharmacist with each prescription and refill. Be sure to read this information carefully each time. Talk to your care  team about the use of this medication in children. Special care may be needed. Overdosage: If you think you have taken too much of this medicine contact a poison control center or emergency room at  once. NOTE: This medicine is only for you. Do not share this medicine with others. What if I miss a dose? This does not apply. This medication should only be taken immediately before going to sleep. Do not take double or extra doses. What may interact with this medication? Barbiturate medications for inducing sleep or treating seizures Carbamazepine Certain medications for allergies, such as azatadine, clemastine, diphenhydramine Certain medications for mental health conditions Certain medications for pain Cimetidine Erythromycin Medications for fungal infections, such as ketoconazole, fluconazole, or itraconazole Other medications given for sleep Phenytoin Rifampin This list may not describe all possible interactions. Give your health care provider a list of all the medicines, herbs, non-prescription drugs, or dietary supplements you use. Also tell them if you smoke, drink alcohol, or use illegal drugs. Some items may interact with your medicine. What should I watch for while using this medication? Visit your care team for regular checks on your progress. Keep a regular sleep schedule by going to bed at about the same time each night. Avoid caffeine-containing drinks in the evening hours. When sleep medications are used every night for more than a few weeks, they may stop working. Talk to your care team if you still have trouble sleeping. You may do unusual sleep behaviors or activities you do not remember the day after taking this medication. Activities include driving, making or eating food, talking on the phone, sexual activity, or sleep walking. Stop taking this medication and call your care team right away if you find out you have done activities like this. Plan to go to bed and stay in bed for a full night (7 to 8 hours) after you take this medication. You may still be drowsy the morning after taking this medication. This medication may affect your coordination, reaction time, or judgment.  Do not drive or operate machinery until you know how this medication affects you. Sit up or stand slowly to reduce the risk of dizzy or fainting spells. If you or your family notice any changes in your behavior, such as new or worsening depression, thoughts of harming yourself, anxiety, other unusual or disturbing thoughts, or memory loss, call your care team right away. After you stop taking this medication, you may have trouble falling asleep. This is called rebound insomnia. This problem usually goes away on its own after 1 or 2 nights. What side effects may I notice from receiving this medication? Side effects that you should report to your care team as soon as possible: Allergic reactions--skin rash, itching, hives, swelling of the face, lips, tongue, or throat Change in vision such as blurry vision, seeing halos around lights, vision loss CNS depression--slow or shallow breathing, shortness of breath, feeling faint, dizziness, confusion, difficulty staying awake Mood and behavior changes--anxiety, nervousness, confusion, hallucinations, irritability, hostility, thoughts of suicide or self-harm, worsening mood, feelings of depression Unusual sleep behaviors or activities you do not remember such as driving, eating, or sexual activity Side effects that usually do not require medical attention (report to your care team if they continue or are bothersome): Diarrhea Dizziness Drowsiness the day after use Pain, tingling, or numbness in the hands or feet This list may not describe all possible side effects. Call your doctor for medical advice about side effects. You may report side effects  to FDA at 1-800-FDA-1088. Where should I keep my medication? Keep out of the reach of children and pets. This medication can be abused. Keep your medication in a safe place to protect it from theft. Do not share this medication with anyone. Selling or giving away this medication is dangerous and against the  law. Store at room temperature between 20 and 25 degrees C (68 and 77 degrees F). Protect from light. This medication may cause accidental overdose and death if taken by other adults, children, or pets. Mix any unused medication with a substance like cat litter or coffee grounds. Then throw the medication away in a sealed container like a sealed bag or a coffee can with a lid. Do not use the medication after the expiration date. NOTE: This sheet is a summary. It may not cover all possible information. If you have questions about this medicine, talk to your doctor, pharmacist, or health care provider.  2024 Elsevier/Gold Standard (2022-10-24 00:00:00)

## 2024-01-27 MED ORDER — ZOLPIDEM TARTRATE 5 MG PO TABS: 5.0000 mg | ORAL_TABLET | Freq: Every evening | ORAL | 0 refills | Status: DC | PRN

## 2024-01-27 NOTE — Telephone Encounter (Signed)
 Ill send in Ambien 5mg  at bedtime for insomnia

## 2024-02-04 ENCOUNTER — Encounter

## 2024-02-04 DIAGNOSIS — R0683 Snoring: Secondary | ICD-10-CM

## 2024-02-18 ENCOUNTER — Observation Stay (HOSPITAL_COMMUNITY)
Admission: EM | Admit: 2024-02-18 | Discharge: 2024-02-20 | Disposition: A | Discharge status: Home / Self Care | Attending: Internal Medicine | Admitting: Internal Medicine

## 2024-02-18 ENCOUNTER — Other Ambulatory Visit: Payer: Self-pay

## 2024-02-18 ENCOUNTER — Encounter (HOSPITAL_COMMUNITY): Payer: Self-pay

## 2024-02-18 DIAGNOSIS — Q0700 Arnold-Chiari syndrome without spina bifida or hydrocephalus: Secondary | ICD-10-CM | POA: Insufficient documentation

## 2024-02-18 DIAGNOSIS — E669 Obesity, unspecified: Secondary | ICD-10-CM | POA: Insufficient documentation

## 2024-02-18 DIAGNOSIS — D500 Iron deficiency anemia secondary to blood loss (chronic): Secondary | ICD-10-CM | POA: Diagnosis present

## 2024-02-18 DIAGNOSIS — E66811 Obesity, class 1: Secondary | ICD-10-CM

## 2024-02-18 DIAGNOSIS — N920 Excessive and frequent menstruation with regular cycle: Secondary | ICD-10-CM | POA: Diagnosis not present

## 2024-02-18 DIAGNOSIS — Z7982 Long term (current) use of aspirin: Secondary | ICD-10-CM | POA: Insufficient documentation

## 2024-02-18 DIAGNOSIS — E876 Hypokalemia: Secondary | ICD-10-CM | POA: Diagnosis not present

## 2024-02-18 DIAGNOSIS — R197 Diarrhea, unspecified: Secondary | ICD-10-CM | POA: Diagnosis not present

## 2024-02-18 DIAGNOSIS — J449 Chronic obstructive pulmonary disease, unspecified: Secondary | ICD-10-CM | POA: Diagnosis not present

## 2024-02-18 DIAGNOSIS — K921 Melena: Secondary | ICD-10-CM | POA: Insufficient documentation

## 2024-02-18 DIAGNOSIS — D509 Iron deficiency anemia, unspecified: Secondary | ICD-10-CM | POA: Diagnosis present

## 2024-02-18 DIAGNOSIS — K2970 Gastritis, unspecified, without bleeding: Secondary | ICD-10-CM | POA: Diagnosis not present

## 2024-02-18 DIAGNOSIS — Z791 Long term (current) use of non-steroidal anti-inflammatories (NSAID): Secondary | ICD-10-CM

## 2024-02-18 DIAGNOSIS — I10 Essential (primary) hypertension: Secondary | ICD-10-CM | POA: Diagnosis not present

## 2024-02-18 DIAGNOSIS — D62 Acute posthemorrhagic anemia: Secondary | ICD-10-CM | POA: Diagnosis present

## 2024-02-18 DIAGNOSIS — Z87891 Personal history of nicotine dependence: Secondary | ICD-10-CM | POA: Insufficient documentation

## 2024-02-18 DIAGNOSIS — Z79899 Other long term (current) drug therapy: Secondary | ICD-10-CM | POA: Insufficient documentation

## 2024-02-18 DIAGNOSIS — D649 Anemia, unspecified: Secondary | ICD-10-CM | POA: Diagnosis present

## 2024-02-18 DIAGNOSIS — E039 Hypothyroidism, unspecified: Secondary | ICD-10-CM

## 2024-02-18 DIAGNOSIS — G935 Compression of brain: Secondary | ICD-10-CM | POA: Diagnosis present

## 2024-02-18 LAB — COMPREHENSIVE METABOLIC PANEL WITH GFR
ALT: 12 U/L (ref 0–44)
AST: 18 U/L (ref 15–41)
Albumin: 3.9 g/dL (ref 3.5–5.0)
Alkaline Phosphatase: 91 U/L (ref 38–126)
Anion gap: 9 (ref 5–15)
BUN: 9 mg/dL (ref 6–20)
CO2: 20 mmol/L — ABNORMAL LOW (ref 22–32)
Calcium: 9 mg/dL (ref 8.9–10.3)
Chloride: 110 mmol/L (ref 98–111)
Creatinine, Ser: 0.81 mg/dL (ref 0.44–1.00)
GFR, Estimated: >60 mL/min (ref >60)
Glucose, Bld: 96 mg/dL (ref 70–99)
Potassium: 3.1 mmol/L — ABNORMAL LOW (ref 3.5–5.1)
Sodium: 139 mmol/L (ref 135–145)
Total Bilirubin: <0.2 mg/dL (ref 0.0–1.2)
Total Protein: 8.3 g/dL — ABNORMAL HIGH (ref 6.5–8.1)

## 2024-02-18 LAB — CBC WITH DIFFERENTIAL/PLATELET
Abs Immature Granulocytes: 0.03 10*3/uL (ref 0.00–0.07)
Basophils Absolute: 0 10*3/uL (ref 0.0–0.1)
Basophils Relative: 1 %
Eosinophils Absolute: 0.2 10*3/uL (ref 0.0–0.5)
Eosinophils Relative: 2 %
HCT: 23.9 % — ABNORMAL LOW (ref 36.0–46.0)
Hemoglobin: 6.8 g/dL — CL (ref 12.0–15.0)
Immature Granulocytes: 0 %
Lymphocytes Relative: 31 %
Lymphs Abs: 2.6 10*3/uL (ref 0.7–4.0)
MCH: 17.8 pg — ABNORMAL LOW (ref 26.0–34.0)
MCHC: 28.5 g/dL — ABNORMAL LOW (ref 30.0–36.0)
MCV: 62.7 fL — ABNORMAL LOW (ref 80.0–100.0)
Monocytes Absolute: 0.6 10*3/uL (ref 0.1–1.0)
Monocytes Relative: 7 %
Neutro Abs: 5 10*3/uL (ref 1.7–7.7)
Neutrophils Relative %: 59 %
Platelets: 665 10*3/uL — ABNORMAL HIGH (ref 150–400)
RBC: 3.81 MIL/uL — ABNORMAL LOW (ref 3.87–5.11)
RDW: 19.7 % — ABNORMAL HIGH (ref 11.5–15.5)
WBC: 8.5 10*3/uL (ref 4.0–10.5)
nRBC: 0 % (ref 0.0–0.2)

## 2024-02-18 LAB — RETICULOCYTES
Immature Retic Fract: 12.3 % (ref 2.3–15.9)
RBC.: 3.77 MIL/uL — ABNORMAL LOW (ref 3.87–5.11)
Retic Count, Absolute: 45.6 10*3/uL (ref 19.0–186.0)
Retic Ct Pct: 1.2 % (ref 0.4–3.1)

## 2024-02-18 LAB — PREPARE RBC (CROSSMATCH)

## 2024-02-18 LAB — IRON AND TIBC
Iron: 23 ug/dL — ABNORMAL LOW (ref 28–170)
Saturation Ratios: 5 % — ABNORMAL LOW (ref 10.4–31.8)
TIBC: 492 ug/dL — ABNORMAL HIGH (ref 250–450)
UIBC: 469 ug/dL

## 2024-02-18 LAB — VITAMIN B12: Vitamin B-12: 254 pg/mL (ref 180–914)

## 2024-02-18 LAB — FOLATE: Folate: 5.2 ng/mL — ABNORMAL LOW (ref >5.9)

## 2024-02-18 LAB — FERRITIN: Ferritin: 2 ng/mL — ABNORMAL LOW (ref 11–307)

## 2024-02-18 MED ORDER — SODIUM CHLORIDE 0.9 % IV BOLUS
1000.0000 mL | Freq: Once | INTRAVENOUS | Status: AC
  Administered 2024-02-18: 1000 mL via INTRAVENOUS

## 2024-02-18 MED ORDER — PANTOPRAZOLE SODIUM 40 MG IV SOLR
40.0000 mg | Freq: Once | INTRAVENOUS | Status: AC
  Administered 2024-02-18: 40 mg via INTRAVENOUS
  Filled 2024-02-18: qty 10

## 2024-02-18 MED ORDER — SODIUM CHLORIDE 0.9% IV SOLUTION: Freq: Once | INTRAVENOUS | Status: AC

## 2024-02-18 NOTE — ED Triage Notes (Signed)
 Pt arrived from home via POV c/o black, watery stools x 2 every hour x 3 or 4 days. Pt states that she has random, minimal abd cramping 3/10 on pain scale.

## 2024-02-18 NOTE — ED Provider Notes (Signed)
 [place] EMERGENCY DEPARTMENT AT [facility] Provider Note   CSN: [id number] Arrival date & time: 02/18/24  2124     History {Add pertinent medical, surgical, social history, OB history to HPI:1} Chief Complaint  Patient presents with   Diarrhea    Joann White is a 48 y.o. female.  Patient complains of 3 days of black diarrhea.  She also complains of weakness.  Patient has a history of heavy vaginal bleeding and also has been taking aspirin daily with Motrin regularly.  The history is provided by the patient and medical records. No language interpreter was used.  Diarrhea Quality:  Black and tarry Severity:  Moderate      Home Medications Prior to Admission medications   Medication Sig Start Date End Date Taking? Authorizing Provider  acetaZOLAMIDE (DIAMOX) 250 MG tablet Take 3 tablets (750 mg total) by mouth 2 (two) times daily. Patient taking differently: Take 250 mg by mouth 2 (two) times daily. 04/29/22   Dala Dublin, MD  albuterol (VENTOLIN HFA) 108 (90 Base) MCG/ACT inhaler Inhale 2 puffs into the lungs every 6 (six) hours as needed for wheezing. Reported on 11/14/2015 05/01/21   Clover Dao, PA-C  amLODipine (NORVASC) 10 MG tablet Take 5 mg by mouth daily. 09/10/21   [provider]  aspirin EC 81 MG tablet Take 81 mg by mouth 2 (two) times a week.    [provider]  diphenhydrAMINE (BENADRYL) 25 mg capsule Take 50 mg by mouth every 6 (six) hours as needed.    [provider]  levothyroxine (SYNTHROID) 25 MCG tablet Take 1 tablet (25 mcg total) by mouth daily before breakfast. 04/12/23   Shamleffer, Ibtehal Jaralla, MD  ondansetron (ZOFRAN-ODT) 4 MG disintegrating tablet as needed.    [provider]  tiZANidine (ZANAFLEX) 2 MG tablet TAKE 1 TABLET BY MOUTH EVERY 8 HOURS AS NEEDED FOR MUSCLE SPASM, NO MORE THAN ONCE DAILY 09/22/22   Dala Dublin, MD  zolpidem (AMBIEN) 5 MG tablet Take 1 tablet (5 mg total) by  mouth at bedtime as needed for sleep. 01/27/24   Antonio Baumgarten, NP      Allergies    Hydromorphone    Review of Systems   Review of Systems  Gastrointestinal:  Positive for diarrhea.    Physical Exam Updated Vital Signs BP (!) 146/88 (BP Location: Right Arm)   Pulse 89   Temp 99.3 F (37.4 C) (Oral)   Resp 16   Ht 5\' 6"  (1.676 m)   Wt 90.7 kg   LMP 02/11/2024 (Approximate)   SpO2 100%   BMI 32.28 kg/m  Physical Exam  ED Results / Procedures / Treatments   Labs (all labs ordered are listed, but only abnormal results are displayed) Labs Reviewed  CBC WITH DIFFERENTIAL/PLATELET - Abnormal; Notable for the following components:      Result Value   RBC 3.81 (*)    Hemoglobin 6.8 (*)    HCT 23.9 (*)    MCV 62.7 (*)    MCH 17.8 (*)    MCHC 28.5 (*)    RDW 19.7 (*)    Platelets 665 (*)    All other components within normal limits  COMPREHENSIVE METABOLIC PANEL WITH GFR - Abnormal; Notable for the following components:   Potassium 3.1 (*)    CO2 20 (*)    Total Protein 8.3 (*)    All other components within normal limits  RETICULOCYTES - Abnormal; Notable for the following components:  RBC. 3.77 (*)    All other components within normal limits  GASTROINTESTINAL PANEL BY PCR, STOOL (REPLACES STOOL CULTURE)  VITAMIN B12  FOLATE  IRON AND TIBC  FERRITIN  POC OCCULT BLOOD, ED  TYPE AND SCREEN  PREPARE RBC (CROSSMATCH)    EKG None  Radiology No results found.  Procedures Procedures  {Document cardiac monitor, telemetry assessment procedure when appropriate:1}  Medications Ordered in ED Medications  0.9 %  sodium chloride infusion (Manually program via Guardrails IV Fluids) (has no administration in time range)  pantoprazole (PROTONIX) injection 40 mg (40 mg Intravenous Given 02/18/24 2202)  sodium chloride 0.9 % bolus 1,000 mL (1,000 mLs Intravenous New Bag/Given 02/18/24 2241)    ED Course/ Medical Decision Making/ A&P  CRITICAL CARE Performed by:  Cheyenne Cotta Total critical care time: 40 minutes Critical care time was exclusive of separately billable procedures and treating other patients. Critical care was necessary to treat or prevent imminent or life-threatening deterioration. Critical care was time spent personally by me on the following activities: development of treatment plan with patient and/or surrogate as well as nursing, discussions with consultants, evaluation of patient's response to treatment, examination of patient, obtaining history from patient or surrogate, ordering and performing treatments and interventions, ordering and review of laboratory studies, ordering and review of radiographic studies, pulse oximetry and re-evaluation of patient's condition.  {   Click here for ABCD2, HEART and other calculatorsREFRESH Note before signing :1}                              Medical Decision Making Amount and/or Complexity of Data Reviewed Labs: ordered.  Risk Prescription drug management. Decision regarding hospitalization.   Patient with anemia most likely related to heavy vaginal bleeding but also patient has black diarrhea that has been heme-negative.  She will be admitted to the hospitalist and transfused  {Document critical care time when appropriate:1} {Document review of labs and clinical decision tools ie heart score, Chads2Vasc2 etc:1}  {Document your independent review of radiology images, and any outside records:1} {Document your discussion with family members, caretakers, and with consultants:1} {Document social determinants of health affecting pt's care:1} {Document your decision making why or why not admission, treatments were needed:1} Final Clinical Impression(s) / ED Diagnoses Final diagnoses:  Diarrhea, unspecified type  Iron deficiency anemia due to chronic blood loss    Rx / DC Orders ED Discharge Orders     None

## 2024-02-19 DIAGNOSIS — K921 Melena: Secondary | ICD-10-CM

## 2024-02-19 DIAGNOSIS — G935 Compression of brain: Secondary | ICD-10-CM | POA: Diagnosis not present

## 2024-02-19 DIAGNOSIS — I10 Essential (primary) hypertension: Secondary | ICD-10-CM

## 2024-02-19 DIAGNOSIS — E876 Hypokalemia: Secondary | ICD-10-CM

## 2024-02-19 DIAGNOSIS — D509 Iron deficiency anemia, unspecified: Secondary | ICD-10-CM | POA: Diagnosis present

## 2024-02-19 DIAGNOSIS — D649 Anemia, unspecified: Secondary | ICD-10-CM

## 2024-02-19 DIAGNOSIS — D62 Acute posthemorrhagic anemia: Secondary | ICD-10-CM

## 2024-02-19 DIAGNOSIS — Z791 Long term (current) use of non-steroidal anti-inflammatories (NSAID): Secondary | ICD-10-CM

## 2024-02-19 DIAGNOSIS — D500 Iron deficiency anemia secondary to blood loss (chronic): Principal | ICD-10-CM

## 2024-02-19 DIAGNOSIS — J449 Chronic obstructive pulmonary disease, unspecified: Secondary | ICD-10-CM | POA: Diagnosis not present

## 2024-02-19 DIAGNOSIS — E66811 Obesity, class 1: Secondary | ICD-10-CM

## 2024-02-19 DIAGNOSIS — E039 Hypothyroidism, unspecified: Secondary | ICD-10-CM

## 2024-02-19 LAB — HIV ANTIBODY (ROUTINE TESTING W REFLEX): HIV Screen 4th Generation wRfx: NONREACTIVE

## 2024-02-19 LAB — BASIC METABOLIC PANEL WITH GFR
Anion gap: 9 (ref 5–15)
BUN: 8 mg/dL (ref 6–20)
CO2: 20 mmol/L — ABNORMAL LOW (ref 22–32)
Calcium: 9.1 mg/dL (ref 8.9–10.3)
Chloride: 111 mmol/L (ref 98–111)
Creatinine, Ser: 0.75 mg/dL (ref 0.44–1.00)
GFR, Estimated: >60 mL/min (ref >60)
Glucose, Bld: 98 mg/dL (ref 70–99)
Potassium: 3 mmol/L — ABNORMAL LOW (ref 3.5–5.1)
Sodium: 140 mmol/L (ref 135–145)

## 2024-02-19 LAB — CBC
HCT: 28.1 % — ABNORMAL LOW (ref 36.0–46.0)
Hemoglobin: 8.3 g/dL — ABNORMAL LOW (ref 12.0–15.0)
MCH: 19.3 pg — ABNORMAL LOW (ref 26.0–34.0)
MCHC: 29.5 g/dL — ABNORMAL LOW (ref 30.0–36.0)
MCV: 65.3 fL — ABNORMAL LOW (ref 80.0–100.0)
Platelets: 568 10*3/uL — ABNORMAL HIGH (ref 150–400)
RBC: 4.3 MIL/uL (ref 3.87–5.11)
RDW: 21.5 % — ABNORMAL HIGH (ref 11.5–15.5)
WBC: 9.5 10*3/uL (ref 4.0–10.5)
nRBC: 0 % (ref 0.0–0.2)

## 2024-02-19 LAB — MAGNESIUM: Magnesium: 2 mg/dL (ref 1.7–2.4)

## 2024-02-19 MED ORDER — ACETAZOLAMIDE 250 MG PO TABS
250.0000 mg | ORAL_TABLET | Freq: Two times a day (BID) | ORAL | Status: DC
  Administered 2024-02-19: 250 mg via ORAL
  Filled 2024-02-19 (×4): qty 1

## 2024-02-19 MED ORDER — ONDANSETRON HCL 4 MG/2ML IJ SOLN: 4.0000 mg | Freq: Four times a day (QID) | INTRAMUSCULAR | Status: DC | PRN

## 2024-02-19 MED ORDER — AMLODIPINE BESYLATE 5 MG PO TABS
10.0000 mg | ORAL_TABLET | Freq: Every day | ORAL | Status: DC
  Administered 2024-02-19 – 2024-02-20 (×2): 10 mg via ORAL
  Filled 2024-02-19 (×2): qty 2

## 2024-02-19 MED ORDER — ACETAMINOPHEN 325 MG PO TABS
650.0000 mg | ORAL_TABLET | Freq: Four times a day (QID) | ORAL | Status: DC | PRN
  Administered 2024-02-19: 650 mg via ORAL
  Filled 2024-02-19: qty 2

## 2024-02-19 MED ORDER — ALBUTEROL SULFATE HFA 108 (90 BASE) MCG/ACT IN AERS: 2.0000 | INHALATION_SPRAY | Freq: Four times a day (QID) | RESPIRATORY_TRACT | Status: DC | PRN

## 2024-02-19 MED ORDER — ALBUTEROL SULFATE (2.5 MG/3ML) 0.083% IN NEBU: 2.5000 mg | INHALATION_SOLUTION | Freq: Four times a day (QID) | RESPIRATORY_TRACT | Status: DC | PRN

## 2024-02-19 MED ORDER — DIPHENHYDRAMINE HCL 25 MG PO CAPS
50.0000 mg | ORAL_CAPSULE | Freq: Four times a day (QID) | ORAL | Status: DC | PRN
  Administered 2024-02-20: 50 mg via ORAL
  Filled 2024-02-19: qty 2

## 2024-02-19 MED ORDER — POTASSIUM CHLORIDE CRYS ER 20 MEQ PO TBCR
40.0000 meq | EXTENDED_RELEASE_TABLET | ORAL | Status: AC
  Administered 2024-02-19 (×2): 40 meq via ORAL
  Filled 2024-02-19 (×2): qty 2

## 2024-02-19 MED ORDER — POLYETHYLENE GLYCOL 3350 17 G PO PACK: 17.0000 g | PACK | Freq: Every day | ORAL | Status: DC | PRN

## 2024-02-19 MED ORDER — PANTOPRAZOLE SODIUM 40 MG IV SOLR
40.0000 mg | Freq: Two times a day (BID) | INTRAVENOUS | Status: DC
  Administered 2024-02-19 – 2024-02-20 (×3): 40 mg via INTRAVENOUS
  Filled 2024-02-19 (×3): qty 10

## 2024-02-19 MED ORDER — ONDANSETRON HCL 4 MG PO TABS: 4.0000 mg | ORAL_TABLET | Freq: Four times a day (QID) | ORAL | Status: DC | PRN

## 2024-02-19 MED ORDER — POTASSIUM CHLORIDE CRYS ER 20 MEQ PO TBCR: 40.0000 meq | EXTENDED_RELEASE_TABLET | ORAL | Status: DC

## 2024-02-19 MED ORDER — ACETAZOLAMIDE 250 MG PO TABS
250.0000 mg | ORAL_TABLET | Freq: Two times a day (BID) | ORAL | Status: DC
  Administered 2024-02-19 – 2024-02-20 (×3): 250 mg via ORAL
  Filled 2024-02-19 (×5): qty 1

## 2024-02-19 MED ORDER — ACETAMINOPHEN 650 MG RE SUPP: 650.0000 mg | Freq: Four times a day (QID) | RECTAL | Status: DC | PRN

## 2024-02-19 MED ORDER — LEVOTHYROXINE SODIUM 25 MCG PO TABS
25.0000 ug | ORAL_TABLET | Freq: Every day | ORAL | Status: DC
  Administered 2024-02-19 – 2024-02-20 (×2): 25 ug via ORAL
  Filled 2024-02-19 (×2): qty 1

## 2024-02-19 MED ORDER — ZOLPIDEM TARTRATE 5 MG PO TABS
5.0000 mg | ORAL_TABLET | Freq: Every evening | ORAL | Status: DC | PRN
  Administered 2024-02-19: 5 mg via ORAL
  Filled 2024-02-19: qty 1

## 2024-02-19 NOTE — Consult Note (Signed)
 Garrison Michie Faizan Murlean Seelye, M.D. Gastroenterology & Hepatology                                           Patient Name: Joann White Account #: @FLAACCTNO @   MRN: [id number] Admission Date: 02/18/2024 Date of Evaluation:  02/19/2024 Time of Evaluation: 11:22 AM  Chief Complaint:  Melena , Acute anemia  HPI:  This is a 48 y.o. female with history of COPD, Bell's palsy, sickle cell trait is admitted with symptomatic anemia.  GI was consulted for melena concerning for upper GI bleed  Patient reports with past 3 to 4 days she is having black tarry stools couple bowel movements a day.  Has been having worsening fatigue for the past week as well.  Takes daily aspirin Motrin for chronic headaches, tooth ache .  Patient does have menorrhagia with 8 days of menstruation and heavy soaking.Patient havent had any melena since last night 9 pm   Labs with baseline hemoglobin 11.9 presenting with 6.8 normal BUN/creatinine ratio  After 1 unit PRBC hemoglobin went up to 8  No plan for endoscopy or colonoscopy   Past Medical History: SEE CHRONIC ISSSUES: Past Medical History:  Diagnosis Date   Allergic rhinitis    Anemia    blood transfusion on 04/09/2020   Anxiety 2022   Arnold-Chiari malformation, type I (HCC)    Bell's palsy 11/12/2017   Chickenpox    Chronic daily headache    COPD (chronic obstructive pulmonary disease) (HCC)    mild   Depression 2022   Essential hypertension    History of blood transfusion 04/09/2020   Low back pain 2020   low back pain   Menorrhagia 2022   Palpitations 10/29/2021   Cardiologist Dr. Amelie Jury, LOV 10/29/21. Patient wore a long term heart monitor that demonstrated rare PACs and PVCs and a limited episode of junctional tachycardia. No treatment required unless palpitations were bothersome. Patient prescribed Metoprol 25 mg 24 hour 1/2 tablet daily.   Paresthesia 11/2021   Patient saw neurologist, Dr. Reyna Cava on 12/04/21 (see MD  note in Epic) for  parathesia and whole body pain. On 01/05/22 the patient had a NCW w/ EMG that was normal.   Sciatica 2020   chronic low back pain with sciatica   Sickle cell trait [facility])    Uterine leiomyoma    Past Surgical History:  Past Surgical History:  Procedure Laterality Date   BRONCHOSCOPY     around 2009 per pt, pt had right-sided chest pain, pt stated results were normal   Family History:  Family History  Problem Relation Age of Onset   Hypertension Mother    COPD Mother    Bronchitis Mother    Cancer Father    Alcoholism Father    Lung cancer Father    Sickle cell anemia Brother    Cancer Paternal Aunt        unknow type   Cancer Paternal Aunt        breast   Social History:  Social History   Tobacco Use   Smoking status: Former    Current packs/day: 2.00    Average packs/day: 2.0 packs/day for 30.0 years (60.0 ttl pk-yrs)    Types: Cigarettes   Smokeless tobacco: Never   Tobacco comments:    Quit 01-07-23  Vaping Use   Vaping status: Some Days   Start  date: 02/06/2023   Substances: Nicotine, Flavoring  Substance Use Topics   Alcohol use: Yes    Comment: Occasional Wine, less than one drink per month   Drug use: Yes    Types: Marijuana    Comment: few times per month    Home Medications:  Prior to Admission medications   Medication Sig Start Date End Date Taking? Authorizing Provider  acetaZOLAMIDE (DIAMOX) 250 MG tablet Take 3 tablets (750 mg total) by mouth 2 (two) times daily. Patient taking differently: Take 250 mg by mouth 2 (two) times daily. 04/29/22  Yes Dala Dublin, MD  amLODipine (NORVASC) 10 MG tablet Take 10 mg by mouth daily as needed (high blood pressure).   Yes [provider]  aspirin EC 81 MG tablet Take 81 mg by mouth 2 (two) times a week.   Yes [provider]  Calcium-Magnesium-Vitamin D (CALCIUM MAGNESIUM PO) Take 1 tablet by mouth daily.   Yes [provider]  ibuprofen (ADVIL) 200 MG tablet Take 600 mg by mouth  every 2 (two) hours as needed for headache or moderate pain (pain score 4-6) (tooth pain).   Yes [provider]  levothyroxine (SYNTHROID) 25 MCG tablet Take 1 tablet (25 mcg total) by mouth daily before breakfast. 04/12/23  Yes Shamleffer, Ibtehal Jaralla, MD  tiZANidine (ZANAFLEX) 2 MG tablet TAKE 1 TABLET BY MOUTH EVERY 8 HOURS AS NEEDED FOR MUSCLE SPASM, NO MORE THAN ONCE DAILY Patient taking differently: Take 2 mg by mouth at bedtime as needed for muscle spasms (relaxation). 09/22/22  Yes Dala Dublin, MD  UNKNOWN TO PATIENT Take 2 tablets by mouth 2 (two) times daily as needed (allergies). OTC Allergy Relief   Yes [provider]  zolpidem (AMBIEN) 5 MG tablet Take 1 tablet (5 mg total) by mouth at bedtime as needed for sleep. 01/27/24  Yes Antonio Baumgarten, NP    Inpatient Medications:  Current Facility-Administered Medications:    acetaminophen (TYLENOL) tablet 650 mg, 650 mg, Oral, Q6H PRN **OR** acetaminophen (TYLENOL) suppository 650 mg, 650 mg, Rectal, Q6H PRN, Emokpae, Ejiroghene E, MD   acetaZOLAMIDE (DIAMOX) tablet 250 mg, 250 mg, Oral, BID, Arrien, Mauricio Daniel, MD   albuterol (PROVENTIL) (2.5 MG/3ML) 0.083% nebulizer solution 2.5 mg, 2.5 mg, Nebulization, Q6H PRN, Emokpae, Ejiroghene E, MD   amLODipine (NORVASC) tablet 10 mg, 10 mg, Oral, Daily, Arrien, Mauricio Daniel, MD   diphenhydrAMINE (BENADRYL) capsule 50 mg, 50 mg, Oral, Q6H PRN, Emokpae, Ejiroghene E, MD   levothyroxine (SYNTHROID) tablet 25 mcg, 25 mcg, Oral, QAC breakfast, Emokpae, Ejiroghene E, MD, 25 mcg at 02/19/24 0531   ondansetron (ZOFRAN) tablet 4 mg, 4 mg, Oral, Q6H PRN **OR** ondansetron (ZOFRAN) injection 4 mg, 4 mg, Intravenous, Q6H PRN, Emokpae, Ejiroghene E, MD   pantoprazole (PROTONIX) injection 40 mg, 40 mg, Intravenous, Q12H, Emokpae, Ejiroghene E, MD, 40 mg at 02/19/24 0826   polyethylene glycol (MIRALAX / GLYCOLAX) packet 17 g, 17 g, Oral, Daily PRN, Emokpae, Ejiroghene E, MD    potassium chloride SA (KLOR-CON M) CR tablet 40 mEq, 40 mEq, Oral, Q4H, Arrien, Mauricio Daniel, MD   zolpidem (AMBIEN) tablet 5 mg, 5 mg, Oral, QHS PRN, Emokpae, Ejiroghene E, MD Allergies: Dilaudid [hydromorphone]  Complete Review of Systems: GENERAL: negative for malaise, night sweats HEENT: No changes in hearing or vision, no nose bleeds or other nasal problems. NECK: Negative for lumps, goiter, pain and significant neck swelling RESPIRATORY: Negative for cough, wheezing CARDIOVASCULAR: Negative for chest pain, leg swelling, palpitations, orthopnea  GI: SEE HPI MUSCULOSKELETAL: Negative for joint pain or swelling, back pain, and muscle pain. SKIN: Negative for lesions, rash PSYCH: Negative for sleep disturbance, mood disorder and recent psychosocial stressors. HEMATOLOGY Negative for prolonged bleeding, bruising easily, and swollen nodes. ENDOCRINE: Negative for cold or heat intolerance, polyuria, polydipsia and goiter. NEURO: negative for tremor, gait imbalance, syncope and seizures. The remainder of the review of systems is noncontributory.  Physical Exam: BP (!) 128/106 (BP Location: Right Arm)   Pulse 63   Temp 98 F (36.7 C) (Oral)   Resp 20   Ht 5\' 6"  (1.676 m)   Wt 90.7 kg   LMP 02/11/2024 (Approximate)   SpO2 100%   BMI 32.28 kg/m  GENERAL: The patient is AO x3, in no acute distress. HEENT: Head is normocephalic and atraumatic. EOMI are intact. Mouth is well hydrated and without lesions. NECK: Supple. No masses LUNGS: Clear to auscultation. No presence of rhonchi/wheezing/rales. Adequate chest expansion HEART: RRR, normal s1 and s2. ABDOMEN: Soft, nontender, no guarding, no peritoneal signs, and nondistended. BS +. No masses. EXTREMITIES: Without any cyanosis, clubbing, rash, lesions or edema. NEUROLOGIC: AOx3, no focal motor deficit. SKIN: no jaundice, no rashes  Laboratory Data CBC:     Component Value Date/Time   WBC 9.5 02/19/2024 0556   RBC 4.30  02/19/2024 0556   HGB 8.3 (L) 02/19/2024 0556   HGB 11.2 (L) 01/25/2022 1230   HGB 11.4 05/04/2017 1557   HCT 28.1 (L) 02/19/2024 0556   HCT 35.0 05/04/2017 1557   PLT 568 (H) 02/19/2024 0556   PLT 587 (H) 01/25/2022 1230   PLT 262 05/04/2017 1557   MCV 65.3 (L) 02/19/2024 0556   MCV 75 (L) 05/04/2017 1557   MCV 80 04/19/2013 0803   MCH 19.3 (L) 02/19/2024 0556   MCHC 29.5 (L) 02/19/2024 0556   RDW 21.5 (H) 02/19/2024 0556   RDW 19.5 (H) 05/04/2017 1557   RDW 15.7 (H) 04/19/2013 0803   LYMPHSABS 2.6 02/18/2024 2219   MONOABS 0.6 02/18/2024 2219   EOSABS 0.2 02/18/2024 2219   BASOSABS 0.0 02/18/2024 2219   COAG: No results found for: "INR", "PROTIME"  BMP:     Latest Ref Rng & Units 02/19/2024    5:56 AM 02/18/2024   10:19 PM 04/11/2023    9:14 AM  BMP  Glucose 70 - 99 mg/dL 98  96  578   BUN 6 - 20 mg/dL 8  9  10    Creatinine 0.44 - 1.00 mg/dL 4.69  6.29  5.28   Sodium 135 - 145 mmol/L 140  139  138   Potassium 3.5 - 5.1 mmol/L 3.0  3.1  3.3   Chloride 98 - 111 mmol/L 111  110  105   CO2 22 - 32 mmol/L 20  20  21    Calcium 8.9 - 10.3 mg/dL 9.1  9.0  9.4     HEPATIC:     Latest Ref Rng & Units 02/18/2024   10:19 PM 11/14/2015    9:49 AM 03/01/2012    2:48 PM  Hepatic Function  Total Protein 6.5 - 8.1 g/dL 8.3  7.5  8.0   Albumin 3.5 - 5.0 g/dL 3.9  3.9  4.2   AST 15 - 41 U/L 18  14  17    ALT 0 - 44 U/L 12  10  10    Alk Phosphatase 38 - 126 U/L 91  74  69   Total Bilirubin 0.0 - 1.2 mg/dL <4.1  0.3  0.4     CARDIAC:  Lab Results  Component Value Date   TROPONINI <0.30 08/26/2014     Imaging: I personally reviewed and interpreted the available imaging.  Assessment & Plan:  This is a 48 y.o. female with history of COPD, Bell's palsy, sickle cell trait is admitted with symptomatic anemia.  GI was consulted for melena concerning for upper GI bleed  #Melena #Acute post hemorrhagic anemia  Patient is presenting with acute post hemorrhagic anemia with a baseline  hemoglobin of 11 now down to 6.8.  With reports of black tarry stools concerning for melena in setting of chronic NSAID use this could be peptic ulcer disease  Although appears the patient is not actively bleeding right now with appropriate transfusion response of hemoglobin going up to 1:08 unit PRBC and last BM this morning was brown   History of iron deficiency anemia could also be multifactorial with underlying menorrhagia  Recommendation:  Clear liquid diet today and n.p.o. past midnight Plan for upper endoscopy tomorrow morning IV PPI twice daily Maintain 2 large-bore IV lines Transfuse to keep hemoglobin more than 7 Trend hemoglobin Would recommend colonoscopy as outpatient for completion of workup iron deficiency anemia Recommend IV iron loading while inpatient given Severe IDA and patient also having Pica  Gabor Lusk Faizan Alexandra Lipps, MD Gastroenterology and Hepatology [facility] Gastroenterology  This chart has been completed using [facility] Dictation software, and while attempts have been made to ensure accuracy , certain words and phrases may not be transcribed as intended

## 2024-02-19 NOTE — Assessment & Plan Note (Addendum)
 Follow up as outpatient  Continue with acetazolamide

## 2024-02-19 NOTE — Assessment & Plan Note (Signed)
 Patient had her menstrual period about 4 days prior to admission.  She was scheduled for hysterectomy, but surgery has been postponed.

## 2024-02-19 NOTE — H&P (View-Only) (Signed)
 Garrison Michie Faizan Murlean Seelye, M.D. Gastroenterology & Hepatology                                           Patient Name: Joann White Account #: @FLAACCTNO @   MRN: [id number] Admission Date: 02/18/2024 Date of Evaluation:  02/19/2024 Time of Evaluation: 11:22 AM  Chief Complaint:  Melena , Acute anemia  HPI:  This is a 48 y.o. female with history of COPD, Bell's palsy, sickle cell trait is admitted with symptomatic anemia.  GI was consulted for melena concerning for upper GI bleed  Patient reports with past 3 to 4 days she is having black tarry stools couple bowel movements a day.  Has been having worsening fatigue for the past week as well.  Takes daily aspirin Motrin for chronic headaches, tooth ache .  Patient does have menorrhagia with 8 days of menstruation and heavy soaking.Patient havent had any melena since last night 9 pm   Labs with baseline hemoglobin 11.9 presenting with 6.8 normal BUN/creatinine ratio  After 1 unit PRBC hemoglobin went up to 8  No plan for endoscopy or colonoscopy   Past Medical History: SEE CHRONIC ISSSUES: Past Medical History:  Diagnosis Date   Allergic rhinitis    Anemia    blood transfusion on 04/09/2020   Anxiety 2022   Arnold-Chiari malformation, type I (HCC)    Bell's palsy 11/12/2017   Chickenpox    Chronic daily headache    COPD (chronic obstructive pulmonary disease) (HCC)    mild   Depression 2022   Essential hypertension    History of blood transfusion 04/09/2020   Low back pain 2020   low back pain   Menorrhagia 2022   Palpitations 10/29/2021   Cardiologist Dr. Amelie Jury, LOV 10/29/21. Patient wore a long term heart monitor that demonstrated rare PACs and PVCs and a limited episode of junctional tachycardia. No treatment required unless palpitations were bothersome. Patient prescribed Metoprol 25 mg 24 hour 1/2 tablet daily.   Paresthesia 11/2021   Patient saw neurologist, Dr. Reyna Cava on 12/04/21 (see MD  note in Epic) for  parathesia and whole body pain. On 01/05/22 the patient had a NCW w/ EMG that was normal.   Sciatica 2020   chronic low back pain with sciatica   Sickle cell trait [facility])    Uterine leiomyoma    Past Surgical History:  Past Surgical History:  Procedure Laterality Date   BRONCHOSCOPY     around 2009 per pt, pt had right-sided chest pain, pt stated results were normal   Family History:  Family History  Problem Relation Age of Onset   Hypertension Mother    COPD Mother    Bronchitis Mother    Cancer Father    Alcoholism Father    Lung cancer Father    Sickle cell anemia Brother    Cancer Paternal Aunt        unknow type   Cancer Paternal Aunt        breast   Social History:  Social History   Tobacco Use   Smoking status: Former    Current packs/day: 2.00    Average packs/day: 2.0 packs/day for 30.0 years (60.0 ttl pk-yrs)    Types: Cigarettes   Smokeless tobacco: Never   Tobacco comments:    Quit 01-07-23  Vaping Use   Vaping status: Some Days   Start  date: 02/06/2023   Substances: Nicotine, Flavoring  Substance Use Topics   Alcohol use: Yes    Comment: Occasional Wine, less than one drink per month   Drug use: Yes    Types: Marijuana    Comment: few times per month    Home Medications:  Prior to Admission medications   Medication Sig Start Date End Date Taking? Authorizing Provider  acetaZOLAMIDE (DIAMOX) 250 MG tablet Take 3 tablets (750 mg total) by mouth 2 (two) times daily. Patient taking differently: Take 250 mg by mouth 2 (two) times daily. 04/29/22  Yes Dala Dublin, MD  amLODipine (NORVASC) 10 MG tablet Take 10 mg by mouth daily as needed (high blood pressure).   Yes [provider]  aspirin EC 81 MG tablet Take 81 mg by mouth 2 (two) times a week.   Yes [provider]  Calcium-Magnesium-Vitamin D (CALCIUM MAGNESIUM PO) Take 1 tablet by mouth daily.   Yes [provider]  ibuprofen (ADVIL) 200 MG tablet Take 600 mg by mouth  every 2 (two) hours as needed for headache or moderate pain (pain score 4-6) (tooth pain).   Yes [provider]  levothyroxine (SYNTHROID) 25 MCG tablet Take 1 tablet (25 mcg total) by mouth daily before breakfast. 04/12/23  Yes Shamleffer, Ibtehal Jaralla, MD  tiZANidine (ZANAFLEX) 2 MG tablet TAKE 1 TABLET BY MOUTH EVERY 8 HOURS AS NEEDED FOR MUSCLE SPASM, NO MORE THAN ONCE DAILY Patient taking differently: Take 2 mg by mouth at bedtime as needed for muscle spasms (relaxation). 09/22/22  Yes Dala Dublin, MD  UNKNOWN TO PATIENT Take 2 tablets by mouth 2 (two) times daily as needed (allergies). OTC Allergy Relief   Yes [provider]  zolpidem (AMBIEN) 5 MG tablet Take 1 tablet (5 mg total) by mouth at bedtime as needed for sleep. 01/27/24  Yes Antonio Baumgarten, NP    Inpatient Medications:  Current Facility-Administered Medications:    acetaminophen (TYLENOL) tablet 650 mg, 650 mg, Oral, Q6H PRN **OR** acetaminophen (TYLENOL) suppository 650 mg, 650 mg, Rectal, Q6H PRN, Emokpae, Ejiroghene E, MD   acetaZOLAMIDE (DIAMOX) tablet 250 mg, 250 mg, Oral, BID, Arrien, Mauricio Daniel, MD   albuterol (PROVENTIL) (2.5 MG/3ML) 0.083% nebulizer solution 2.5 mg, 2.5 mg, Nebulization, Q6H PRN, Emokpae, Ejiroghene E, MD   amLODipine (NORVASC) tablet 10 mg, 10 mg, Oral, Daily, Arrien, Mauricio Daniel, MD   diphenhydrAMINE (BENADRYL) capsule 50 mg, 50 mg, Oral, Q6H PRN, Emokpae, Ejiroghene E, MD   levothyroxine (SYNTHROID) tablet 25 mcg, 25 mcg, Oral, QAC breakfast, Emokpae, Ejiroghene E, MD, 25 mcg at 02/19/24 0531   ondansetron (ZOFRAN) tablet 4 mg, 4 mg, Oral, Q6H PRN **OR** ondansetron (ZOFRAN) injection 4 mg, 4 mg, Intravenous, Q6H PRN, Emokpae, Ejiroghene E, MD   pantoprazole (PROTONIX) injection 40 mg, 40 mg, Intravenous, Q12H, Emokpae, Ejiroghene E, MD, 40 mg at 02/19/24 0826   polyethylene glycol (MIRALAX / GLYCOLAX) packet 17 g, 17 g, Oral, Daily PRN, Emokpae, Ejiroghene E, MD    potassium chloride SA (KLOR-CON M) CR tablet 40 mEq, 40 mEq, Oral, Q4H, Arrien, Mauricio Daniel, MD   zolpidem (AMBIEN) tablet 5 mg, 5 mg, Oral, QHS PRN, Emokpae, Ejiroghene E, MD Allergies: Dilaudid [hydromorphone]  Complete Review of Systems: GENERAL: negative for malaise, night sweats HEENT: No changes in hearing or vision, no nose bleeds or other nasal problems. NECK: Negative for lumps, goiter, pain and significant neck swelling RESPIRATORY: Negative for cough, wheezing CARDIOVASCULAR: Negative for chest pain, leg swelling, palpitations, orthopnea  GI: SEE HPI MUSCULOSKELETAL: Negative for joint pain or swelling, back pain, and muscle pain. SKIN: Negative for lesions, rash PSYCH: Negative for sleep disturbance, mood disorder and recent psychosocial stressors. HEMATOLOGY Negative for prolonged bleeding, bruising easily, and swollen nodes. ENDOCRINE: Negative for cold or heat intolerance, polyuria, polydipsia and goiter. NEURO: negative for tremor, gait imbalance, syncope and seizures. The remainder of the review of systems is noncontributory.  Physical Exam: BP (!) 128/106 (BP Location: Right Arm)   Pulse 63   Temp 98 F (36.7 C) (Oral)   Resp 20   Ht 5\' 6"  (1.676 m)   Wt 90.7 kg   LMP 02/11/2024 (Approximate)   SpO2 100%   BMI 32.28 kg/m  GENERAL: The patient is AO x3, in no acute distress. HEENT: Head is normocephalic and atraumatic. EOMI are intact. Mouth is well hydrated and without lesions. NECK: Supple. No masses LUNGS: Clear to auscultation. No presence of rhonchi/wheezing/rales. Adequate chest expansion HEART: RRR, normal s1 and s2. ABDOMEN: Soft, nontender, no guarding, no peritoneal signs, and nondistended. BS +. No masses. EXTREMITIES: Without any cyanosis, clubbing, rash, lesions or edema. NEUROLOGIC: AOx3, no focal motor deficit. SKIN: no jaundice, no rashes  Laboratory Data CBC:     Component Value Date/Time   WBC 9.5 02/19/2024 0556   RBC 4.30  02/19/2024 0556   HGB 8.3 (L) 02/19/2024 0556   HGB 11.2 (L) 01/25/2022 1230   HGB 11.4 05/04/2017 1557   HCT 28.1 (L) 02/19/2024 0556   HCT 35.0 05/04/2017 1557   PLT 568 (H) 02/19/2024 0556   PLT 587 (H) 01/25/2022 1230   PLT 262 05/04/2017 1557   MCV 65.3 (L) 02/19/2024 0556   MCV 75 (L) 05/04/2017 1557   MCV 80 04/19/2013 0803   MCH 19.3 (L) 02/19/2024 0556   MCHC 29.5 (L) 02/19/2024 0556   RDW 21.5 (H) 02/19/2024 0556   RDW 19.5 (H) 05/04/2017 1557   RDW 15.7 (H) 04/19/2013 0803   LYMPHSABS 2.6 02/18/2024 2219   MONOABS 0.6 02/18/2024 2219   EOSABS 0.2 02/18/2024 2219   BASOSABS 0.0 02/18/2024 2219   COAG: No results found for: "INR", "PROTIME"  BMP:     Latest Ref Rng & Units 02/19/2024    5:56 AM 02/18/2024   10:19 PM 04/11/2023    9:14 AM  BMP  Glucose 70 - 99 mg/dL 98  96  578   BUN 6 - 20 mg/dL 8  9  10    Creatinine 0.44 - 1.00 mg/dL 4.69  6.29  5.28   Sodium 135 - 145 mmol/L 140  139  138   Potassium 3.5 - 5.1 mmol/L 3.0  3.1  3.3   Chloride 98 - 111 mmol/L 111  110  105   CO2 22 - 32 mmol/L 20  20  21    Calcium 8.9 - 10.3 mg/dL 9.1  9.0  9.4     HEPATIC:     Latest Ref Rng & Units 02/18/2024   10:19 PM 11/14/2015    9:49 AM 03/01/2012    2:48 PM  Hepatic Function  Total Protein 6.5 - 8.1 g/dL 8.3  7.5  8.0   Albumin 3.5 - 5.0 g/dL 3.9  3.9  4.2   AST 15 - 41 U/L 18  14  17    ALT 0 - 44 U/L 12  10  10    Alk Phosphatase 38 - 126 U/L 91  74  69   Total Bilirubin 0.0 - 1.2 mg/dL <4.1  0.3  0.4     CARDIAC:  Lab Results  Component Value Date   TROPONINI <0.30 08/26/2014     Imaging: I personally reviewed and interpreted the available imaging.  Assessment & Plan:  This is a 48 y.o. female with history of COPD, Bell's palsy, sickle cell trait is admitted with symptomatic anemia.  GI was consulted for melena concerning for upper GI bleed  #Melena #Acute post hemorrhagic anemia  Patient is presenting with acute post hemorrhagic anemia with a baseline  hemoglobin of 11 now down to 6.8.  With reports of black tarry stools concerning for melena in setting of chronic NSAID use this could be peptic ulcer disease  Although appears the patient is not actively bleeding right now with appropriate transfusion response of hemoglobin going up to 1:08 unit PRBC and last BM this morning was brown   History of iron deficiency anemia could also be multifactorial with underlying menorrhagia  Recommendation:  Clear liquid diet today and n.p.o. past midnight Plan for upper endoscopy tomorrow morning IV PPI twice daily Maintain 2 large-bore IV lines Transfuse to keep hemoglobin more than 7 Trend hemoglobin Would recommend colonoscopy as outpatient for completion of workup iron deficiency anemia Recommend IV iron loading while inpatient given Severe IDA and patient also having Pica  Gabor Lusk Faizan Alexandra Lipps, MD Gastroenterology and Hepatology [facility] Gastroenterology  This chart has been completed using [facility] Dictation software, and while attempts have been made to ensure accuracy , certain words and phrases may not be transcribed as intended

## 2024-02-19 NOTE — Assessment & Plan Note (Signed)
 Continue with levothyroxine

## 2024-02-19 NOTE — Assessment & Plan Note (Signed)
 No signs of exacerbation.

## 2024-02-19 NOTE — ED Notes (Signed)
 Dr. Sunnie England notified of hemoglobin level of 8.3 after the first unit of PRBC. MD reported to hold off on administering second unit of PRBC.

## 2024-02-19 NOTE — Progress Notes (Addendum)
  Progress Note   Patient: Joann White:[id number] DOB: 08/15/1976 DOA: 02/18/2024     0 DOS: the patient was seen and examined on 02/19/2024   Brief hospital course: Joann White was admitted to the hospital with the working diagnosis of acute blood loss anemia due to upper GI bleed.   48 yo female with the past medical history of hypertension, COPD, Bell's palsy, and sickle cell trait who presented with melena. Positive dark color stools for the last 3 to 4 days prior to admission. Positive heavy menstrual periods.  On her initial physical examination her blood pressure was 129/77, HR 78, RR 16 and 02 saturation 100% Lungs with no wheezing or rales, heart with S1 and S2 present and regular, abdomen non tender and not distended, no lower extremity edema.   Na 139, K 3,1 Cl 110, bicarbonate 20 glucose 96 bun 9 cr 0,81  Wbc 8,5 hgb 6,8 plt 665   Assessment and Plan: * Acute blood loss anemia Patient sp one unit PRBC transfusion.  Follow up hgb is 8,3   Plan to continue close monitoring of hgb and hct Hold on further transfusion PRBC for now.  Continue pantoprazole IV bid  Follow up on GI recommendations.   Iron deficiency anemia Iron panel with serum iron down to 23, with TIBC 492, transferrin saturation 5, ferritin 2 ad folate 5.2 (low)  Vitamin B 12 254 (normal).   Patient did not tolerate IV iron in the past.  Will continue with oral iron for now, if no improvement may have to reconsider repeating IV iron, maybe different formulation.   HTN (hypertension) Continue blood pressure monitoring  Resume amlodipine for blood pressure control.   Hypokalemia Continue K correction with Kcl will give 2 doses of 40 meq and will follow up renal function and electrolytes.   Joann White malformation, type I (HCC) Follow up as outpatient  Continue with acetazolamide   COPD (chronic obstructive pulmonary disease) (HCC) No signs of exacerbation   Menorrhagia Patient had her  menstrual period about 4 days ago She was scheduled for hysterectomy, but surgery has been postponed.   Hypothyroidism Continue with levothyroxine   Obesity, class 1 Calculated BMI is 32.2    Subjective: Patient with moderate abdominal pain, no nausea or vomiting, no hematemesis, positive melena.   Physical Exam: Vitals:   02/19/24 0230 02/19/24 0242 02/19/24 0421 02/19/24 0632  BP: (!) 143/96 (!) 133/93 (!) 140/77 (!) 134/92  Pulse: 75 73 77 73  Resp: 18 19 16 20   Temp:  98.2 F (36.8 C)    TempSrc:  Oral    SpO2: 99% 99% 99% 100%  Weight:      Height:       Neurology awake and alert ENT with mild pallor with no icterus Cardiovascular with S1 and S2 present and regular with no gallops, rubs or murmurs Respiratory with no rales or wheezing, no rhonchi  Abdomen with no distention  No lower extremity edema  Data Reviewed:    Family Communication: no family at the bedside   Disposition: Status is: Observation The patient remains OBS appropriate and will d/c before 2 midnights.  Planned Discharge Destination: Home    Author: Albertus Alt, MD 02/19/2024 8:29 AM  For on call review [email or web address].

## 2024-02-19 NOTE — Progress Notes (Signed)
   02/19/24 1740  TOC Brief Assessment  Insurance and Status Reviewed  Patient has primary care physician Yes  Home environment has been reviewed Single family home  Prior level of function: Independent  Prior/Current Home Services No current home services  Readmission risk has been reviewed Yes  Transition of care needs no transition of care needs at this time   Transition of Care Department [facility]) has reviewed patient and no TOC needs have been identified at this time. We will continue to monitor patient advancement through interdisciplinary progression rounds. If new patient transition needs arise, please place a TOC consult

## 2024-02-19 NOTE — Assessment & Plan Note (Signed)
 Iron panel with serum iron down to 23, with TIBC 492, transferrin saturation 5, ferritin 2 ad folate 5.2 (low)  Vitamin B 12 254 (normal).   Patient did not tolerate IV iron in the past.  Will continue with oral iron for now, if no improvement may have to reconsider repeating IV iron, maybe different formulation.

## 2024-02-19 NOTE — Assessment & Plan Note (Addendum)
 Electrolytes were corrected. At the time of her discharge her K is 3,6 and serum bicarbonate at 18  Na 139   Will give 40 meq Kcl prior to discharge.  Follow up renal function and electrolytes as outpatient.  Patient will benefit from KCl supplementation while taking acetazolamide.

## 2024-02-19 NOTE — Assessment & Plan Note (Signed)
Continue blood pressure control with amlodipine.  

## 2024-02-19 NOTE — Hospital Course (Signed)
 Joann White was admitted to the hospital with the working diagnosis of acute blood loss anemia due to upper GI bleed.   48 yo female with the past medical history of hypertension, COPD, Bell's palsy, and sickle cell trait who presented with melena. Positive dark color stools for the last 3 to 4 days prior to admission. Positive heavy menstrual periods.  On her initial physical examination her blood pressure was 129/77, HR 78, RR 16 and 02 saturation 100% Lungs with no wheezing or rales, heart with S1 and S2 present and regular, abdomen non tender and not distended, no lower extremity edema.   Na 139, K 3,1 Cl 110, bicarbonate 20 glucose 96 bun 9 cr 0,81  Wbc 8,5 hgb 6,8 plt 665   Patient had one unit PRBC transfusion with good toleration.   04/14 EGD with gastritis and shallow ulceration in the gastric antrum. Hgb continue stable.  Plan to continue pantoprazole 40 mg bid and follow up as outpatient.

## 2024-02-19 NOTE — H&P (Signed)
 History and Physical    Joann White BJY:[id number] DOB: May 12, 1976 DOA: 02/18/2024  PCP: Lauran Pollard, MD  Chief Complaint: Home  HPI: Joann White is a 48 y.o. female with medical history significant for hypertension, and arterial malformations, COPD, hypertension, Bell's palsy, sickle cell trait. Patient presented to the ED with complaints of multiple episodes of black watery stools over the past 3 to 4 days.  She reports up to 2 bowel movements per hour.  No blood in stools, no vomiting.  She reports some very mild abdominal pain.  She reports chronic fatigue worse over the past week.  No dyspnea no chest pain. She reports almost daily use of aspirin and Motrin for chronic headaches.  She reports heavy menstrual periods on average 7 to 8 days, and up to 10- 14 days on occasion.  ED Course: Blood pressure systolic 120s to 086.  Hemoglobin 6.8 down from baseline 11.1.  Potassium 3.1. PRBC ordered for transfusion. 1 L bolus, Protonix 40 mg x 1 given.  Review of Systems: As per HPI all other systems reviewed and negative.  Past Medical History:  Diagnosis Date   Allergic rhinitis    Anemia    blood transfusion on 04/09/2020   Anxiety 2022   Arnold-Chiari malformation, type I (HCC)    Bell's palsy 11/12/2017   Chickenpox    Chronic daily headache    COPD (chronic obstructive pulmonary disease) (HCC)    mild   Depression 2022   Essential hypertension    History of blood transfusion 04/09/2020   Low back pain 2020   low back pain   Menorrhagia 2022   Palpitations 10/29/2021   Cardiologist Dr. Amelie Jury, LOV 10/29/21. Patient wore a long term heart monitor that demonstrated rare PACs and PVCs and a limited episode of junctional tachycardia. No treatment required unless palpitations were bothersome. Patient prescribed Metoprol 25 mg 24 hour 1/2 tablet daily.   Paresthesia 11/2021   Patient saw neurologist, Dr. Reyna Cava on 12/04/21 (see MD  note in Epic) for  parathesia and whole body pain. On 01/05/22 the patient had a NCW w/ EMG that was normal.   Sciatica 2020   chronic low back pain with sciatica   Sickle cell trait [facility])    Uterine leiomyoma     Past Surgical History:  Procedure Laterality Date   BRONCHOSCOPY     around 2009 per pt, pt had right-sided chest pain, pt stated results were normal     reports that she has quit smoking. Her smoking use included cigarettes. She has a 60 pack-year smoking history. She has never used smokeless tobacco. She reports current alcohol use. She reports current drug use. Drug: Marijuana.  Allergies  Allergen Reactions   Hydromorphone Nausea And Vomiting and Other (See Comments)    Dizziness     Family History  Problem Relation Age of Onset   Hypertension Mother    COPD Mother    Bronchitis Mother    Cancer Father    Alcoholism Father    Lung cancer Father    Sickle cell anemia Brother    Cancer Paternal Aunt        unknow type   Cancer Paternal Aunt        breast   Prior to Admission medications   Medication Sig Start Date End Date Taking? Authorizing Provider  acetaZOLAMIDE (DIAMOX) 250 MG tablet Take 3 tablets (750 mg total) by mouth 2 (two) times daily. Patient taking differently: Take 250  mg by mouth 2 (two) times daily. 04/29/22   Dala Dublin, MD  albuterol (VENTOLIN HFA) 108 (90 Base) MCG/ACT inhaler Inhale 2 puffs into the lungs every 6 (six) hours as needed for wheezing. Reported on 11/14/2015 05/01/21   Clover Dao, PA-C  amLODipine (NORVASC) 10 MG tablet Take 5 mg by mouth daily. 09/10/21   [provider]  aspirin EC 81 MG tablet Take 81 mg by mouth 2 (two) times a week.    [provider]  diphenhydrAMINE (BENADRYL) 25 mg capsule Take 50 mg by mouth every 6 (six) hours as needed.    [provider]  levothyroxine (SYNTHROID) 25 MCG tablet Take 1 tablet (25 mcg total) by mouth daily before breakfast. 04/12/23   Shamleffer, Ibtehal Jaralla, MD   ondansetron (ZOFRAN-ODT) 4 MG disintegrating tablet as needed.    [provider]  tiZANidine (ZANAFLEX) 2 MG tablet TAKE 1 TABLET BY MOUTH EVERY 8 HOURS AS NEEDED FOR MUSCLE SPASM, NO MORE THAN ONCE DAILY 09/22/22   Dala Dublin, MD  zolpidem (AMBIEN) 5 MG tablet Take 1 tablet (5 mg total) by mouth at bedtime as needed for sleep. 01/27/24   Antonio Baumgarten, NP    Physical Exam: Vitals:   02/18/24 2340 02/18/24 2345 02/18/24 2350 02/18/24 2355  BP: (!) 132/101 129/77 133/82 130/82  Pulse: 78 73 75 74  Resp: 16 16 15 15   Temp: 98.1 F (36.7 C)   98.2 F (36.8 C)  TempSrc: Oral   Oral  SpO2:  100% 100% 100%  Weight:      Height:        Constitutional: NAD, calm, comfortable Vitals:   02/18/24 2340 02/18/24 2345 02/18/24 2350 02/18/24 2355  BP: (!) 132/101 129/77 133/82 130/82  Pulse: 78 73 75 74  Resp: 16 16 15 15   Temp: 98.1 F (36.7 C)   98.2 F (36.8 C)  TempSrc: Oral   Oral  SpO2:  100% 100% 100%  Weight:      Height:       Eyes: PERRL, lids and conjunctivae normal ENMT: Mucous membranes are moist.  Neck: normal, supple, no masses, no thyromegaly Respiratory: clear to auscultation bilaterally, no wheezing, no crackles. Normal respiratory effort. No accessory muscle use.  Cardiovascular: Regular rate and rhythm, no murmurs / rubs / gallops. No extremity edema.  Abdomen: no tenderness, no masses palpated. No hepatosplenomegaly.  Musculoskeletal: no clubbing / cyanosis. No joint deformity upper and lower extremities.  Skin: no rashes, lesions, ulcers. No induration Neurologic: No facial symmetry, moving extremities spontaneously, speech fluent.Aaron Aas  Psychiatric: Normal judgment and insight. Alert and oriented x 3. Normal mood.   Labs on Admission: I have personally reviewed following labs and imaging studies  CBC: Recent Labs  Lab 02/18/24 2219  WBC 8.5  NEUTROABS 5.0  HGB 6.8*  HCT 23.9*  MCV 62.7*  PLT 665*   Basic Metabolic Panel: Recent Labs   Lab 02/18/24 2219  NA 139  K 3.1*  CL 110  CO2 20*  GLUCOSE 96  BUN 9  CREATININE 0.81  CALCIUM 9.0   GFR: Estimated Creatinine Clearance: 97.5 mL/min (by C-G formula based on SCr of 0.81 mg/dL). Liver Function Tests: Recent Labs  Lab 02/18/24 2219  AST 18  ALT 12  ALKPHOS 91  BILITOT <0.2  PROT 8.3*  ALBUMIN 3.9   Anemia Panel: Recent Labs    02/18/24 2219  VITAMINB12 254  FOLATE 5.2*  FERRITIN 2*  TIBC 492*  IRON 23*  RETICCTPCT 1.2   Radiological Exams on Admission: No results found.  EKG: None.  Assessment/Plan Principal Problem:   Acute anemia Active Problems:   HTN (hypertension)   Headache   Menorrhagia   Iron deficiency anemia   Arnold-Chiari malformation, type I (HCC)   COPD (chronic obstructive pulmonary disease) (HCC)  Assessment and Plan: No notes have been filed under this hospital service. Service: Hospitalist  Acute anemia-symptomatic. Hemoglobin down to 6.8 from baseline of about 11 - 2 years ago.  Vital stable.  Presenting with multiple black watery stools.  Positive chronic NSAID use. No hematemesis no hematochezia. History of iron deficiency anemia 2/2 menorrhagia, likely contributing. -Transfuse 1 unit for now, and repeat hemoglobin -N.p.o. midnight -GI consult in a.m. -IV Protonix 40 twice daily -Trend hemoglobin -Stool occult -1 L bolus given  Menorrhagia-chronic history.  Reports she has seen GYN and has been tried on multiple medications without improvement.  She was scheduled for hysterectomy, but this has been postponed due to other medical problems. -Needs to follow-up with GYN for definitive management of her menorrhagia.  Hypokalemia potassium 3.1 -Check magnesium  Arnold-Chiari malformation -Continue acetazolamide  Hypertension-not on medication.  Hypothyroidism -Resume Synthroid  DVT prophylaxis: SCDs Code Status: Full Code Family Communication: Spouse at bedside Disposition Plan: ~ 2 days Consults  called: GI Admission status: Obs tele  Author: Pati Bonine, MD 02/19/2024 12:53 AM  For on call review [email or web address].

## 2024-02-19 NOTE — ED Notes (Signed)
 Per MD after first unit hold second unit until pt is re-evaluated

## 2024-02-19 NOTE — Plan of Care (Signed)

## 2024-02-19 NOTE — Assessment & Plan Note (Signed)
 Patient was placed on IV pantoprazole and clear liquid diet.  Patient sp one unit PRBC transfusion.  Follow up hgb is 8,7  EGD today with normal esophagus, positive gastritis, erythema and shallow ulceration found in the gastric antrum. Biopsies were taken.  No signs of active bleeding.  Plan to continue with bid pantoprazole 40 mg and follow up as outpatient.  Limit non steroidal anti inflammatory agents.  Follow up cell count in 1 week as outpatient.

## 2024-02-19 NOTE — Assessment & Plan Note (Signed)
Calculated BMI is 32.2  

## 2024-02-20 ENCOUNTER — Observation Stay (HOSPITAL_COMMUNITY)

## 2024-02-20 ENCOUNTER — Encounter (HOSPITAL_COMMUNITY): Payer: Self-pay | Admitting: Internal Medicine

## 2024-02-20 ENCOUNTER — Telehealth: Payer: Self-pay | Admitting: Gastroenterology

## 2024-02-20 ENCOUNTER — Encounter (HOSPITAL_COMMUNITY)
Admission: EM | Discharge status: Home / Self Care | Payer: Self-pay | Source: Home / Self Care | Attending: Emergency Medicine

## 2024-02-20 DIAGNOSIS — E876 Hypokalemia: Secondary | ICD-10-CM | POA: Diagnosis not present

## 2024-02-20 DIAGNOSIS — E039 Hypothyroidism, unspecified: Secondary | ICD-10-CM

## 2024-02-20 DIAGNOSIS — I10 Essential (primary) hypertension: Secondary | ICD-10-CM | POA: Diagnosis not present

## 2024-02-20 DIAGNOSIS — K3189 Other diseases of stomach and duodenum: Secondary | ICD-10-CM

## 2024-02-20 DIAGNOSIS — D62 Acute posthemorrhagic anemia: Secondary | ICD-10-CM | POA: Diagnosis not present

## 2024-02-20 DIAGNOSIS — K921 Melena: Secondary | ICD-10-CM | POA: Diagnosis not present

## 2024-02-20 DIAGNOSIS — K2970 Gastritis, unspecified, without bleeding: Secondary | ICD-10-CM

## 2024-02-20 DIAGNOSIS — D500 Iron deficiency anemia secondary to blood loss (chronic): Secondary | ICD-10-CM | POA: Diagnosis not present

## 2024-02-20 DIAGNOSIS — G935 Compression of brain: Secondary | ICD-10-CM | POA: Diagnosis not present

## 2024-02-20 HISTORY — PX: ESOPHAGOGASTRODUODENOSCOPY: SHX5428

## 2024-02-20 LAB — BASIC METABOLIC PANEL WITH GFR
Anion gap: 8 (ref 5–15)
BUN: 8 mg/dL (ref 6–20)
CO2: 18 mmol/L — ABNORMAL LOW (ref 22–32)
Calcium: 9.5 mg/dL (ref 8.9–10.3)
Chloride: 113 mmol/L — ABNORMAL HIGH (ref 98–111)
Creatinine, Ser: 0.87 mg/dL (ref 0.44–1.00)
GFR, Estimated: >60 mL/min (ref >60)
Glucose, Bld: 96 mg/dL (ref 70–99)
Potassium: 3.6 mmol/L (ref 3.5–5.1)
Sodium: 139 mmol/L (ref 135–145)

## 2024-02-20 LAB — CBC
HCT: 29.8 % — ABNORMAL LOW (ref 36.0–46.0)
Hemoglobin: 8.7 g/dL — ABNORMAL LOW (ref 12.0–15.0)
MCH: 19 pg — ABNORMAL LOW (ref 26.0–34.0)
MCHC: 29.2 g/dL — ABNORMAL LOW (ref 30.0–36.0)
MCV: 65.2 fL — ABNORMAL LOW (ref 80.0–100.0)
Platelets: 567 10*3/uL — ABNORMAL HIGH (ref 150–400)
RBC: 4.57 MIL/uL (ref 3.87–5.11)
RDW: 21.7 % — ABNORMAL HIGH (ref 11.5–15.5)
WBC: 10 10*3/uL (ref 4.0–10.5)
nRBC: 0 % (ref 0.0–0.2)

## 2024-02-20 SURGERY — EGD (ESOPHAGOGASTRODUODENOSCOPY)
Anesthesia: General

## 2024-02-20 MED ORDER — LIDOCAINE HCL (PF) 2 % IJ SOLN
INTRAMUSCULAR | Status: DC | PRN
  Administered 2024-02-20: 80 mg via INTRADERMAL

## 2024-02-20 MED ORDER — FERROUS SULFATE 325 (65 FE) MG PO TBEC
325.0000 mg | DELAYED_RELEASE_TABLET | Freq: Every day | ORAL | Status: DC
  Filled 2024-02-20: qty 1

## 2024-02-20 MED ORDER — PANTOPRAZOLE SODIUM 40 MG PO TBEC: 40.0000 mg | DELAYED_RELEASE_TABLET | Freq: Two times a day (BID) | ORAL | 0 refills | Status: AC

## 2024-02-20 MED ORDER — POTASSIUM CHLORIDE CRYS ER 20 MEQ PO TBCR: 20.0000 meq | EXTENDED_RELEASE_TABLET | Freq: Every day | ORAL | Status: DC

## 2024-02-20 MED ORDER — ORAL CARE MOUTH RINSE: 15.0000 mL | OROMUCOSAL | Status: DC | PRN

## 2024-02-20 MED ORDER — ACETAZOLAMIDE 250 MG PO TABS: 250.0000 mg | ORAL_TABLET | Freq: Two times a day (BID) | ORAL | Status: DC

## 2024-02-20 MED ORDER — GLYCOPYRROLATE PF 0.2 MG/ML IJ SOSY
PREFILLED_SYRINGE | INTRAMUSCULAR | Status: DC | PRN
Start: 2024-02-20 — End: 2024-02-20
  Administered 2024-02-20 (×2): .1 mg via INTRAVENOUS

## 2024-02-20 MED ORDER — LACTATED RINGERS IV SOLN: INTRAVENOUS | Status: DC | PRN

## 2024-02-20 MED ORDER — FERROUS SULFATE 325 (65 FE) MG PO TBEC: 325.0000 mg | DELAYED_RELEASE_TABLET | Freq: Every day | ORAL | 0 refills | Status: DC

## 2024-02-20 MED ORDER — PROPOFOL 10 MG/ML IV BOLUS
INTRAVENOUS | Status: DC | PRN
  Administered 2024-02-20: 70 mg via INTRAVENOUS

## 2024-02-20 MED ORDER — POTASSIUM CHLORIDE CRYS ER 20 MEQ PO TBCR: 20.0000 meq | EXTENDED_RELEASE_TABLET | Freq: Every day | ORAL | 0 refills | Status: DC

## 2024-02-20 MED ORDER — POTASSIUM CHLORIDE CRYS ER 20 MEQ PO TBCR
40.0000 meq | EXTENDED_RELEASE_TABLET | Freq: Once | ORAL | Status: AC
  Administered 2024-02-20: 40 meq via ORAL
  Filled 2024-02-20: qty 2

## 2024-02-20 MED ORDER — PROPOFOL 500 MG/50ML IV EMUL
INTRAVENOUS | Status: DC | PRN
  Administered 2024-02-20: 150 ug/kg/min via INTRAVENOUS

## 2024-02-20 MED ORDER — PANTOPRAZOLE SODIUM 40 MG PO TBEC: 40.0000 mg | DELAYED_RELEASE_TABLET | Freq: Two times a day (BID) | ORAL | Status: DC

## 2024-02-20 NOTE — Telephone Encounter (Signed)
 Joann White: needs hospital follow-up in 3-4 weeks with Dr. Alita Irwin or myself.  [name]: please arrange CBC in 1 week. Thanks!

## 2024-02-20 NOTE — Transfer of Care (Signed)
 Immediate Anesthesia Transfer of Care Note  Patient: Joann White  Procedure(s) Performed: EGD (ESOPHAGOGASTRODUODENOSCOPY)  Patient Location: PACU  Anesthesia Type:General  Level of Consciousness: drowsy and patient cooperative  Airway & Oxygen Therapy: Patient Spontanous Breathing and Patient connected to face mask oxygen  Post-op Assessment: Report given to RN and Post -op Vital signs reviewed and stable  Post vital signs: Reviewed and stable  Last Vitals:  Vitals Value Taken Time  BP 108/77 02/20/24 1439  Temp 36.7 C 02/20/24 1439  Pulse 85 02/20/24 1440  Resp 23 02/20/24 1440  SpO2 100 % 02/20/24 1440  Vitals shown include unfiled device data.  Last Pain:  Vitals:   02/20/24 1424  TempSrc:   PainSc: 0-No pain         Complications: No notable events documented.

## 2024-02-20 NOTE — Anesthesia Postprocedure Evaluation (Signed)
 Anesthesia Post Note  Patient: Joann White  Procedure(s) Performed: EGD (ESOPHAGOGASTRODUODENOSCOPY)  Patient location during evaluation: PACU Anesthesia Type: General Level of consciousness: awake and alert Pain management: pain level controlled Vital Signs Assessment: post-procedure vital signs reviewed and stable Respiratory status: spontaneous breathing, nonlabored ventilation, respiratory function stable and patient connected to nasal cannula oxygen Cardiovascular status: stable and blood pressure returned to baseline Postop Assessment: no apparent nausea or vomiting Anesthetic complications: no   No notable events documented.   Last Vitals:  Vitals:   02/20/24 1445 02/20/24 1449  BP: 114/84   Pulse: 87 92  Resp: 16 18  Temp:    SpO2: 100% 100%    Last Pain:  Vitals:   02/20/24 1445  TempSrc:   PainSc: 0-No pain                 Beacher Limerick

## 2024-02-20 NOTE — Progress Notes (Signed)
 The patient is unable to produce a urine sample for a pregnancy test. She states that she does not believe she is pregnant and she is willing to proceed knowing the possible risks of receiving general anesthesia  if she is pregnant.

## 2024-02-20 NOTE — Plan of Care (Signed)
  Problem: Education: Goal: Knowledge of General Education information will improve Description: Including pain rating scale, medication(s)/side effects and non-pharmacologic comfort measures Outcome: Progressing   Problem: Health Behavior/Discharge Planning: Goal: Ability to manage health-related needs will improve Outcome: Progressing   Problem: Activity: Goal: Risk for activity intolerance will decrease Outcome: Progressing   Problem: Nutrition: Goal: Adequate nutrition will be maintained Outcome: Not Progressing   

## 2024-02-20 NOTE — Anesthesia Preprocedure Evaluation (Signed)
 Anesthesia Evaluation    Airway Mallampati: II  TM Distance: >3 FB     Dental  (+) Missing   Pulmonary COPD, former smoker   breath sounds clear to auscultation       Cardiovascular hypertension,  Rhythm:Regular Rate:Normal     Neuro/Psych  Headaches PSYCHIATRIC DISORDERS Anxiety Depression     Neuromuscular disease    GI/Hepatic   Endo/Other  Hypothyroidism    Renal/GU      Musculoskeletal   Abdominal Normal abdominal exam  (+)   Peds  Hematology  (+) Blood dyscrasia, anemia   Anesthesia Other Findings   Reproductive/Obstetrics                              Anesthesia Physical Anesthesia Plan  ASA: 3  Anesthesia Plan: General   Post-op Pain Management:    Induction: Intravenous  PONV Risk Score and Plan: 1 and Propofol infusion  Airway Management Planned: Nasal Cannula  Additional Equipment:   Intra-op Plan:   Post-operative Plan:   Informed Consent: I have reviewed the patients History and Physical, chart, labs and discussed the procedure including the risks, benefits and alternatives for the proposed anesthesia with the patient or authorized representative who has indicated his/her understanding and acceptance.       Plan Discussed with: CRNA and Surgeon  Anesthesia Plan Comments:          Anesthesia Quick Evaluation

## 2024-02-20 NOTE — Op Note (Signed)
 [facility] Patient Name: Joann White Procedure Date: 02/20/2024 2:11 PM MRN: [id number] Date of Birth: 28-Jul-1976 Attending MD: Hennie Duos. Marletta Lor , [place], [id number] CSN: [id number] Age: 48 Admit Type: Inpatient Procedure:                Upper GI endoscopy Indications:              Acute post hemorrhagic anemia, Melena Providers:                Hennie Duos. Marletta Lor, DO, Crystal Page, Lennice Sites                            Technician, Technician Referring MD:              Medicines:                See the Anesthesia note for documentation of the                            administered medications Complications:            No immediate complications. Estimated Blood Loss:     Estimated blood loss was minimal. Procedure:                Pre-Anesthesia Assessment:                           - The anesthesia plan was to use monitored                            anesthesia care (MAC).                           After obtaining informed consent, the endoscope was                            passed under direct vision. Throughout the                            procedure, the patient's blood pressure, pulse, and                            oxygen saturations were monitored continuously. The                            [id number]-H190 ([id number]) scope was introduced through the                            mouth, and advanced to the second part of duodenum.                            The upper GI endoscopy was accomplished without                            difficulty. The patient tolerated the procedure                            well. Scope  In: 2:29:35 PM Scope Out: 2:33:57 PM Total Procedure Duration: 0 hours 4 minutes 22 seconds  Findings:      The examined esophagus was normal.      Localized moderate inflammation characterized by erosions, erythema and       shallow ulcerations was found in the gastric antrum. Biopsies were taken       with a cold forceps for Helicobacter pylori testing.      The  duodenal bulb, first portion of the duodenum and second portion of       the duodenum were normal. Impression:               - Normal esophagus.                           - Gastritis. Biopsied.                           - Normal duodenal bulb, first portion of the                            duodenum and second portion of the duodenum. Moderate Sedation:      Per Anesthesia Care Recommendation:           - Return patient to hospital ward for ongoing care.                           - Resume regular diet.                           - Okay to DC from GI standpoint. Repeat CBC in 1                            week. Follow-up in GI office in 3 to 4 weeks.                            Continue PPI 40 mg twice daily upon discharge.                            Limit NSAID use. Procedure Code(s):        --- Professional ---                           [phone], Esophagogastroduodenoscopy, flexible,                            transoral; with biopsy, single or multiple Diagnosis Code(s):        --- Professional ---                           K29.70, Gastritis, unspecified, without bleeding                           D62, Acute posthemorrhagic anemia                           K92.1, Melena (includes Hematochezia) CPT copyright 2022 American [employer] Association. All rights reserved.  The codes documented in this report are preliminary and upon coder review may  be revised to meet current compliance requirements. Joann White. Joann April, DO Joann White. Joann April, DO 02/20/2024 2:37:38 PM This report has been signed electronically. Number of Addenda: 0

## 2024-02-20 NOTE — Interval H&P Note (Signed)
 History and Physical Interval Note:  02/20/2024 1:50 PM  Joann White  has presented today for surgery, with the diagnosis of upper Gi bleed.  The various methods of treatment have been discussed with the patient and family. After consideration of risks, benefits and other options for treatment, the patient has consented to  Procedure(s): EGD (ESOPHAGOGASTRODUODENOSCOPY) (N/A) as a surgical intervention.  The patient's history has been reviewed, patient examined, no change in status, stable for surgery.  I have reviewed the patient's chart and labs.  Questions were answered to the patient's satisfaction.     Vinetta Greening

## 2024-02-20 NOTE — Discharge Summary (Signed)
 Physician Discharge Summary   Patient: Joann White MRN: [id number] DOB: 07-Jan-1976  Admit date:     02/18/2024  Discharge date: 02/20/24  Discharge Physician: York Ram Saman Umstead   PCP: Donetta Potts, MD   Recommendations at discharge:    Plan to continue pantoprazole 40 mg po bid Iron supplementation with Ferous sulfate daily Follow up cell count and renal function in 7 days.  Added Kcl supplementation to avoid hypokalemia.  Follow up with Dr Mayford Knife in 7 to 10 days Follow up with GI as scheduled, follow up on Biopsy report.   Discharge Diagnoses: Principal Problem:   Acute blood loss anemia Active Problems:   Iron deficiency anemia   HTN (hypertension)   Hypokalemia   Arnold-Chiari malformation, type I (HCC)   COPD (chronic obstructive pulmonary disease) (HCC)   Menorrhagia   Hypothyroidism   Obesity, class 1   NSAID long-term use   Melena  Resolved Problems:   * No resolved hospital problems. [facility] Course: Joann White was admitted to the hospital with the working diagnosis of acute blood loss anemia due to upper GI bleed.   48 yo female with the past medical history of hypertension, COPD, Bell's palsy, and sickle cell trait who presented with melena. Positive dark color stools for the last 3 to 4 days prior to admission. Positive heavy menstrual periods.  On her initial physical examination her blood pressure was 129/77, HR 78, RR 16 and 02 saturation 100% Lungs with no wheezing or rales, heart with S1 and S2 present and regular, abdomen non tender and not distended, no lower extremity edema.   Na 139, K 3,1 Cl 110, bicarbonate 20 glucose 96 bun 9 cr 0,81  Wbc 8,5 hgb 6,8 plt 665   Patient had one unit PRBC transfusion with good toleration.   04/14 EGD with gastritis and shallow ulceration in the gastric antrum. Hgb continue stable.  Plan to continue pantoprazole 40 mg bid and follow up as outpatient.   Assessment and Plan: * Acute blood loss  anemia Patient was placed on IV pantoprazole and clear liquid diet.  Patient sp one unit PRBC transfusion.  Follow up hgb is 8,7  EGD today with normal esophagus, positive gastritis, erythema and shallow ulceration found in the gastric antrum. Biopsies were taken.  No signs of active bleeding.  Plan to continue with bid pantoprazole 40 mg and follow up as outpatient.  Limit non steroidal anti inflammatory agents.  Follow up cell count in 1 week as outpatient.     Iron deficiency anemia Iron panel with serum iron down to 23, with TIBC 492, transferrin saturation 5, ferritin 2 ad folate 5.2 (low)  Vitamin B 12 254 (normal).   Patient did not tolerate IV iron in the past.  Will continue with oral iron for now, if no improvement may have to reconsider repeating IV iron, maybe different formulation.   HTN (hypertension) Continue blood pressure control with amlodipine   Hypokalemia Electrolytes were corrected. At the time of her discharge her K is 3,6 and serum bicarbonate at 18  Na 139   Will give 40 meq Kcl prior to discharge.  Follow up renal function and electrolytes as outpatient.  Patient will benefit from KCl supplementation while taking acetazolamide.   Arnold-Chiari malformation, type I (HCC) Follow up as outpatient  Continue with acetazolamide   COPD (chronic obstructive pulmonary disease) (HCC) No signs of exacerbation   Menorrhagia Patient had her menstrual period about 4 days prior to  admission.  She was scheduled for hysterectomy, but surgery has been postponed.   Hypothyroidism Continue with levothyroxine   Obesity, class 1 Calculated BMI is 32.2         Consultants: GI  Procedures performed: EGD   Disposition: Home Diet recommendation:  Cardiac diet DISCHARGE MEDICATION: Allergies as of 02/20/2024       Reactions   Dilaudid [hydromorphone] Nausea And Vomiting, Other (See Comments)   Dizziness         Medication List     STOP taking  these medications    aspirin EC 81 MG tablet   ibuprofen 200 MG tablet Commonly known as: ADVIL       TAKE these medications    acetaZOLAMIDE 250 MG tablet Commonly known as: DIAMOX Take 1 tablet (250 mg total) by mouth 2 (two) times daily.   amLODipine 10 MG tablet Commonly known as: NORVASC Take 10 mg by mouth daily as needed (high blood pressure).   CALCIUM MAGNESIUM PO Take 1 tablet by mouth daily.   ferrous sulfate 325 (65 FE) MG EC tablet Take 1 tablet (325 mg total) by mouth daily with breakfast. Start taking on: February 21, 2024   levothyroxine 25 MCG tablet Commonly known as: SYNTHROID Take 1 tablet (25 mcg total) by mouth daily before breakfast.   pantoprazole 40 MG tablet Commonly known as: PROTONIX Take 1 tablet (40 mg total) by mouth 2 (two) times daily.   potassium chloride SA 20 MEQ tablet Commonly known as: KLOR-CON M Take 1 tablet (20 mEq total) by mouth daily. Start taking on: February 21, 2024   tiZANidine 2 MG tablet Commonly known as: ZANAFLEX TAKE 1 TABLET BY MOUTH EVERY 8 HOURS AS NEEDED FOR MUSCLE SPASM, NO MORE THAN ONCE DAILY What changed:  how much to take how to take this when to take this reasons to take this additional instructions   UNKNOWN TO PATIENT Take 2 tablets by mouth 2 (two) times daily as needed (allergies). OTC Allergy Relief   zolpidem 5 MG tablet Commonly known as: Ambien Take 1 tablet (5 mg total) by mouth at bedtime as needed for sleep.        Discharge Exam: Filed Weights   02/18/24 2136  Weight: 90.7 kg   BP 114/84 (BP Location: Left Arm)   Pulse 82   Temp 98.1 F (36.7 C)   Resp 19   Ht 5\' 6"  (1.676 m)   Wt 90.7 kg   LMP 02/11/2024 (Approximate)   SpO2 100%   BMI 32.28 kg/m   Patient is feeling better, no chest pain or dyspnea. Post EGD  Neurology awake and alert ENT with mild pallor Cardiovascular with S1 and S2 present and regular with no gallops rubs or murmurs Respiratory with no rales or  wheezing Abdomen with no distention  No lower extremity edema   Condition at discharge: stable  The results of significant diagnostics from this hospitalization (including imaging, microbiology, ancillary and laboratory) are listed below for reference.   Imaging Studies: No results found.  Microbiology: Results for orders placed or performed during the hospital encounter of 11/13/22  Resp panel by RT-PCR (RSV, Flu A&B, Covid) Anterior Nasal Swab     Status: Abnormal   Collection Time: 11/13/22 10:10 AM   Specimen: Anterior Nasal Swab  Result Value Ref Range Status   SARS Coronavirus 2 by RT PCR POSITIVE (A) NEGATIVE Final    Comment: (NOTE) SARS-CoV-2 target nucleic acids are DETECTED.  The SARS-CoV-2 RNA is  generally detectable in upper respiratory specimens during the acute phase of infection. Positive results are indicative of the presence of the identified virus, but do not rule out bacterial infection or co-infection with other pathogens not detected by the test. Clinical correlation with patient history and other diagnostic information is necessary to determine patient infection status. The expected result is Negative.  Fact Sheet for Patients: BloggerCourse.com  Fact Sheet for Healthcare Providers: SeriousBroker.it  This test is not yet approved or cleared by the [place] FDA and  has been authorized for detection and/or diagnosis of SARS-CoV-2 by FDA under an Emergency Use Authorization (EUA).  This EUA will remain in effect (meaning this test can be used) for the duration of  the COVID-19 declaration under Section 564(b)(1) of the A ct, 21 U.S.C. section 360bbb-3(b)(1), unless the authorization is terminated or revoked sooner.     Influenza A by PCR NEGATIVE NEGATIVE Final   Influenza B by PCR NEGATIVE NEGATIVE Final    Comment: (NOTE) The Xpert Xpress SARS-CoV-2/FLU/RSV plus assay is intended as an aid in  the diagnosis of influenza from Nasopharyngeal swab specimens and should not be used as a sole basis for treatment. Nasal washings and aspirates are unacceptable for Xpert Xpress SARS-CoV-2/FLU/RSV testing.  Fact Sheet for Patients: BloggerCourse.com  Fact Sheet for Healthcare Providers: SeriousBroker.it  This test is not yet approved or cleared by the [place] FDA and has been authorized for detection and/or diagnosis of SARS-CoV-2 by FDA under an Emergency Use Authorization (EUA). This EUA will remain in effect (meaning this test can be used) for the duration of the COVID-19 declaration under Section 564(b)(1) of the Act, 21 U.S.C. section 360bbb-3(b)(1), unless the authorization is terminated or revoked.     Resp Syncytial Virus by PCR NEGATIVE NEGATIVE Final    Comment: (NOTE) Fact Sheet for Patients: BloggerCourse.com  Fact Sheet for Healthcare Providers: SeriousBroker.it  This test is not yet approved or cleared by the [place] FDA and has been authorized for detection and/or diagnosis of SARS-CoV-2 by FDA under an Emergency Use Authorization (EUA). This EUA will remain in effect (meaning this test can be used) for the duration of the COVID-19 declaration under Section 564(b)(1) of the Act, 21 U.S.C. section 360bbb-3(b)(1), unless the authorization is terminated or revoked.  Performed at [facility], [address] [place], [place] [address]     Labs: CBC: Recent Labs  Lab 02/18/24 2219 02/19/24 0556 02/20/24 0542  WBC 8.5 9.5 10.0  NEUTROABS 5.0  --   --   HGB 6.8* 8.3* 8.7*  HCT 23.9* 28.1* 29.8*  MCV 62.7* 65.3* 65.2*  PLT 665* 568* 567*   Basic Metabolic Panel: Recent Labs  Lab 02/18/24 2219 02/19/24 0556 02/20/24 0542  NA 139 140 139  K 3.1* 3.0* 3.6  CL 110 111 113*  CO2 20* 20* 18*  GLUCOSE 96 98 96  BUN 9 8 8   CREATININE 0.81 0.75  0.87  CALCIUM 9.0 9.1 9.5  MG 2.0  --   --    Liver Function Tests: Recent Labs  Lab 02/18/24 2219  AST 18  ALT 12  ALKPHOS 91  BILITOT <0.2  PROT 8.3*  ALBUMIN 3.9   CBG: No results for input(s): "GLUCAP" in the last 168 hours.  Discharge time spent: greater than 30 minutes.  Signed: Coralie Keens, MD [facility] 02/20/2024

## 2024-02-21 ENCOUNTER — Encounter (HOSPITAL_COMMUNITY): Payer: Self-pay | Admitting: Internal Medicine

## 2024-02-22 ENCOUNTER — Other Ambulatory Visit: Payer: Self-pay

## 2024-02-22 DIAGNOSIS — K921 Melena: Secondary | ICD-10-CM

## 2024-02-22 LAB — TYPE AND SCREEN
ABO/RH(D): A POS
Antibody Screen: NEGATIVE
Unit division: 0
Unit division: 0

## 2024-02-22 LAB — BPAM RBC
Blood Product Expiration Date: 202504272359
Blood Product Expiration Date: 202505052359
ISSUE DATE / TIME: 202504122335
Unit Type and Rh: 6200
Unit Type and Rh: 6200

## 2024-02-22 LAB — SURGICAL PATHOLOGY

## 2024-02-22 NOTE — Telephone Encounter (Signed)
 OV made for 03/20/2024 at 230 pm to see Karna Pacas, NP

## 2024-02-22 NOTE — Telephone Encounter (Signed)
 Pt was made aware of appt and was instructed to have lab completed on Monday. Pt verbalized understanding.

## 2024-02-22 NOTE — Telephone Encounter (Signed)
Lmom for pt to return call and sent pt a myChart message.

## 2024-02-28 LAB — CBC WITH DIFFERENTIAL/PLATELET
Absolute Lymphocytes: 2876 {cells}/uL (ref 850–3900)
Absolute Monocytes: 470 {cells}/uL (ref 200–950)
Basophils Absolute: 41 {cells}/uL (ref 0–200)
Basophils Relative: 0.5 %
Eosinophils Absolute: 178 {cells}/uL (ref 15–500)
Eosinophils Relative: 2.2 %
HCT: 32.4 % — ABNORMAL LOW (ref 35.0–45.0)
Hemoglobin: 8.9 g/dL — ABNORMAL LOW (ref 11.7–15.5)
MCH: 18.5 pg — ABNORMAL LOW (ref 27.0–33.0)
MCHC: 27.5 g/dL — ABNORMAL LOW (ref 32.0–36.0)
MCV: 67.2 fL — ABNORMAL LOW (ref 80.0–100.0)
MPV: 10.3 fL (ref 7.5–12.5)
Monocytes Relative: 5.8 %
Neutro Abs: 4536 {cells}/uL (ref 1500–7800)
Neutrophils Relative %: 56 %
Platelets: 197 10*3/uL (ref 140–400)
RBC: 4.82 10*6/uL (ref 3.80–5.10)
RDW: 21.5 % — ABNORMAL HIGH (ref 11.0–15.0)
Total Lymphocyte: 35.5 %
WBC: 8.1 10*3/uL (ref 3.8–10.8)

## 2024-02-28 LAB — CBC MORPHOLOGY

## 2024-03-01 ENCOUNTER — Encounter: Payer: Self-pay | Admitting: *Deleted

## 2024-03-02 ENCOUNTER — Ambulatory Visit: Payer: BC Managed Care – PPO | Admitting: Neurology

## 2024-03-02 ENCOUNTER — Encounter: Payer: Self-pay | Admitting: Nurse Practitioner

## 2024-03-02 ENCOUNTER — Encounter: Payer: Self-pay | Admitting: Neurology

## 2024-03-02 VITALS — BP 110/75 | HR 83 | Ht 66.0 in | Wt 189.6 lb

## 2024-03-02 DIAGNOSIS — J320 Chronic maxillary sinusitis: Secondary | ICD-10-CM

## 2024-03-02 DIAGNOSIS — M62838 Other muscle spasm: Secondary | ICD-10-CM

## 2024-03-02 DIAGNOSIS — R519 Headache, unspecified: Secondary | ICD-10-CM

## 2024-03-02 DIAGNOSIS — J3489 Other specified disorders of nose and nasal sinuses: Secondary | ICD-10-CM

## 2024-03-02 DIAGNOSIS — G935 Compression of brain: Secondary | ICD-10-CM | POA: Diagnosis not present

## 2024-03-02 NOTE — Patient Instructions (Addendum)
 Send back to neurosurgery for discussion on decompression of chiari (bring 2024 MRI with 9mm chiari "peg like" on CD with you) Continue the diamox  and tizanidine  Send to ENT for left maxilary and ethmoid sinusitis vs large cyst , (see pictures in note) appears significant.symptomatic with drainage and pain on palpation of the left maxillary sinus, bloody discharge

## 2024-03-02 NOTE — Progress Notes (Signed)
 CC:  headaches  Follow-up Visit  03/01/2024: Send back to neurosurgery for discussion on decompression of chiari (bring 2024 MRI with 9mm chiari "peg like" on CD with her): there is crowding and pressure on the brainstem(see picture below), she is symptomatic and she has pressure, pain with coughing and sneezing is unbearable, she has dizziness and imbalance and neck pain, recommend considering decompression surgery and evaluation with flow studies. She is willing to consider decompression  Continue the diamox  and tizanidine   Send to ENT for left maxilary and ethmoid sinusitis vs large cyst , (see pictures in note) appears significant.symptomatic with drainage and pain on palpation of the left maxillary sinus, bloody discharge    Despite "mild" 6mm chiari 2019, was 9mm and "peglike" in 2024 there is crowding and pressure on the brainstem(see picture below), she is symptomatic and she has pressure, pain with coughing and sneezing is unbearable, she has dizziness and imbalance and neck pain, recommend considering decompression surgery and evaluation with flow studies. She is willing to consider decompression. The chiari may be causing increased intracranial pressure so likely secondary to chiari. MRI 2024 showed "peglike morphology" as well. Would recommend getting the MR from 12/20/2022 on MRI to bring with her.   No other focal neurologic deficits, associated symptoms, inciting events or modifiable factors.  Patient complains of symptoms per HPI as well as the following symptoms: none . Pertinent negatives and positives per HPI. All others negative   Last visit: 06/08/2022: Joann White is a 48 y.o. female with past medical history significant for headache since 2017, left Bell's palsy in 2019, Chiari I malformation, hypertension, hypothyroidism, chest palpitation, chest pain, PVCs, iron  deficiency anemia, low back pain, anxiety, COPD, menorrhagia, COVID (11/2022). She has a lot of pain in  the back of the head on the left side. Right above the left eye and back of the head. She has dizziness and imbalance. When she sneezes the head pain is horrible and she is scared to bear down or cough. She was sent to ENT for the large cyst in her left nostrol and she has symptoms. 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 She has chronic sinus issues, she always has a sinus headache, the pain with coughing and sneezing and straining is worsened the past 3 months come back "with a vengeance". Almost feels like the pain is so debilitating she feels she will suffer brain damage when she coughs or sneezes. Continued pressure headache.   MRI of the brain: IMPRESSION: Mild Chiari malformation. Cerebellar tonsils 6 mm below the foramen magnum. Otherwise negative MRI head   Large cyst filling the left maxillary antrum.  CT head 2022: FINDINGS: Brain: No acute intracranial findings are seen. Ventricles are not dilated. There is no shift of midline structures. There are no epidural or subdural fluid collections. Cerebellar tonsils are lower than usual in the position suggesting Chiari 1 malformation. This finding has not changed significantly. There is increased amount of CSF in the sella suggesting partial empty sella with no significant interval change.   Vascular: Unremarkable.   Skull: No fracture is seen.   Sinuses/Orbits: There is opacification of left maxillary sinus. There is mild mucosal thickening in the ethmoid sinus.   Other: None   IMPRESSION: No acute intracranial findings are seen in noncontrast CT brain. Other findings as described in the body of the report.   Chronic ethmoid and maxillary sinusitis  Brief HPI: 48 year  old female with a history of Bells palsy (left) 2018, Chiari malformation who follows in clinic for headaches. MRI brain 07/14/21 showed an 8 mm Chiari malformation and partially empty  sella.  At her last visit, Topamax  dose and Imitrex  dose were increased. MRI C-spine was ordered.  03/02/2024: Last seen by my colleague Dr. Billy Bue, here to transition to me.   Interval History 06/08/2022: She saw her optometrist in March who noted papilledema on her exam. CTV was ordered, and she was referred to ophthalmology. CTV was negative for thrombosis or venous stenosis. She did have evidence of sinusitis on the left. 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   She developed paresthesias on Topamax  so she was switched to Qulipta  (did not want an injectable medication). This caused constipation, so she stopped it. She was started on Diamox  to treat for possible IIH.  She did not have improvement with Imitrex  or Maxalt  for rescue. Tried Nurtec sample which did resolve her headache, but was denied by insurance. Diclofenac  helped but caused GI upset so she was started on Celebrex . This has been slightly effective.  Pressure in her head has improved with Diamox . She continues to have shooting pain which radiates from behind her ears to behind her eyes.   In the past month she has started to feel paresthesias and a buzzing sensation in her arms and on her face. She is taking potassium 99 mg daily which does help. She has also started to have word finding difficulty.  Feels her vision is worse. She struggles to see her cell phone screen. She is not sure when her next ophthalmology appointment is.   Headache days per month: 30 Headache free days per month: 0  Current Headache Regimen: Preventative: Diamox  750 mg BID Abortive: Celebrex    Prior Therapies                                  Topamax  - paresthesias Metoprolol  Cymbalta  Citalopram Diamox  Qulipta  - constipation Imitrex  Maxalt  Ubrelvy Nurtec - helped but was not covered by insurance (too many headache days per month) Diclofenac  - stomach upset Celebrex   Physical Exam:   Vitals:   03/02/24 0833   BP: 110/75  Pulse: 83    Vitals:   03/02/24 0833  BP: 110/75  Pulse: 83  Height: 5\' 6"  (1.676 m)  Weight: 189 lb 9.6 oz (86 kg)  BMI (Calculated): 30.62     Physical exam: Exam: Gen: NAD, conversant      CV: No palpitations or chest pain or SOB. VS: Breathing at a normal rate. obese. Not febrile. Eyes: Conjunctivae clear without exudates or hemorrhage  Neuro: Detailed Neurologic Exam  Speech:    Speech is normal; fluent and spontaneous with normal comprehension.  Cognition:    The patient is oriented to person, place, and time;     recent and remote memory intact;     language fluent;     normal attention, concentration, fund of knowledge Cranial Nerves:    The pupils are equal, round, and reactive to light. Visual fields are full Extraocular movements are intact.  The face is symmetric with normal sensation. The palate elevates in the midline. Hearing intact. Voice is normal. Shoulder shrug is normal. The tongue has normal motion without fasciculations.   Coordination: normal  Gait:    No abnormalities noted or reported  Motor Observation:   no involuntary movements noted. Tone:  Appears normal  Posture:    Posture is normal. normal erect    Strength:    Strength is anti-gravity and symmetric in the upper and lower limbs.      Sensation: intact to LT, no reports of numbness or tingling or paresthesias        Assessment and Plan: 48 year old female with a history of Bell's palsy, Chiari malformation (dx 2017) who presents for follow up of headaches. Chiari appears symptomatic and is peg-like on MRI, recommend decompressin and patient agrees she will go back to nsy. In addition, At this point it is unclear if she truly has IDIOPATHIC INTRACRANIAL HYPERTENSION vs chiari causing the intracranial hypertension - I suspect the chiari given MRi findings and symptoms c/w chiari. Ophthalmology exam was inconclusive for papilledema vs pseudopapilledema. She does not  appear to have significant papilledema on non-dilated fundus exam today but does continue to report vision changes and headaches. LP would offer diagnostic clarity, though would prefer to avoid this if possible due to increased risk of herniation with Chiari malformation. Recommend decompression evaluation.  Send back to neurosurgery for discussion on decompression of chiari (bring 2024 MRI with 9mm chiari "peg like" on CD with her): there is crowding and pressure on the brainstem(see picture below), she is symptomatic and she has pressure, pain with coughing and sneezing is unbearable, she has dizziness and imbalance and neck pain, recommend considering decompression surgery and evaluation with flow studies. She is willing to consider decompression  Continue the diamox  and tizanidine   Send to ENT for left maxilary and ethmoid sinusitis vs large cyst , (see pictures in note) appears significant.symptomatic with drainage and pain on palpation of the left maxillary sinus, bloody discharge    Despite "mild" 6mm chiari 2019, was 9mm and "peglike" in 2024 there is crowding and pressure on the brainstem(see picture below), she is symptomatic and she has pressure, pain with coughing and sneezing is unbearable, she has dizziness and imbalance and neck pain, recommend considering decompression surgery and evaluation with flow studies. She is willing to consider decompression. The chiari may be causing increased intracranial pressure so likely secondary to chiari. MRI 2024 showed "peglike morphology" as well. Would recommend getting the MR from 12/20/2022 on MRI to bring with her.   11/23/2022: fundoscopy without papilledema    MRI 01/08/2023:  1.  Cerebellar tonsillar descent with peglike morphology favoring a Chiari I malformation. However, there are additional nonspecific findings that can be seen in idiopathic intracranial hypertension including partially empty sella turcica and mild narrowing of the right  transverse sinus, and a component of cerebellar tonsillar ectopia related to intracranial hypertension is a consideration.  2.  Few scattered T2 hyperintense white matter lesions are abnormal but nonspecific, typically attributed to chronic ischemia associated with migraine or age-advanced small vessel disease or sequela of prior trauma/inflammation/demyelination. More confluent FLAIR hyperintensity within the bilateral occipital lobes is favored to represent artifact given the absence of correlating findings on additional pulse sequences.  3.  Mild inflammatory mucosal thickening in the left maxillary and ethmoid sinuses.  Continue diamox  as treatment     March 2023 sinus:    2019 MRI sinus    Orders Placed This Encounter  Procedures   Ambulatory referral to ENT   Ambulatory referral to Neurosurgery   Meds ordered this encounter  Medications   acetaZOLAMIDE  (DIAMOX ) 250 MG tablet    Sig: Take 1 tablet (250 mg total) by mouth 2 (two) times daily.    Dispense:  180 tablet    Refill:  4   tiZANidine  (ZANAFLEX ) 2 MG tablet    Sig: Take 1 tablet (2 mg total) by mouth at bedtime as needed for muscle spasms (relaxation).    Dispense:  30 tablet    Refill:  11     Chanler Mendonca, MD   I spent over 40 minutes of face-to-face and non-face-to-face time with patient on the  1. Chiari I malformation (HCC)   2. Worsening headaches   3. Maxillary sinusitis, chronic   4. Chronic nasal discharge   5. Pain of maxillary sinus   6. Muscle spasms of neck    diagnosis.  This included previsit chart review, lab review, study review, order entry, electronic health record documentation, patient education on the different diagnostic and therapeutic options, counseling and coordination of care, risks and benefits of management, compliance, or risk factor reduction

## 2024-03-05 ENCOUNTER — Telehealth: Payer: Self-pay | Admitting: Neurology

## 2024-03-05 DIAGNOSIS — G935 Compression of brain: Secondary | ICD-10-CM | POA: Insufficient documentation

## 2024-03-05 MED ORDER — ACETAZOLAMIDE 250 MG PO TABS: 250.0000 mg | ORAL_TABLET | Freq: Two times a day (BID) | ORAL | 4 refills | Status: AC

## 2024-03-05 MED ORDER — TIZANIDINE HCL 2 MG PO TABS: 2.0000 mg | ORAL_TABLET | Freq: Every evening | ORAL | 11 refills | Status: DC | PRN

## 2024-03-05 NOTE — Telephone Encounter (Signed)
Sent in; thanks

## 2024-03-05 NOTE — Telephone Encounter (Signed)
 Looks like OV note is not quite finished.

## 2024-03-05 NOTE — Telephone Encounter (Signed)
 Referral for neurosurgery fax to [facility]. Phone: [phone], Fax: [phone]

## 2024-03-05 NOTE — Telephone Encounter (Signed)
 Pt called and LVM stating that medications were supposed to be called in for her at her last appt per provider. Pt called pharmacy and was informed they have not received the pt's Rx.

## 2024-03-09 DIAGNOSIS — G4733 Obstructive sleep apnea (adult) (pediatric): Secondary | ICD-10-CM | POA: Diagnosis not present

## 2024-03-13 NOTE — Telephone Encounter (Signed)
**Note De-identified  [place] Obfuscation** Please advise 

## 2024-03-13 NOTE — Telephone Encounter (Signed)
 We can increase Ambien  dose to 10mg  or try a different sleep aid. Unfortunately it is some trial and error. Is there anything in the past that has worked? Let me know

## 2024-03-14 ENCOUNTER — Other Ambulatory Visit: Payer: Self-pay

## 2024-03-14 DIAGNOSIS — G4733 Obstructive sleep apnea (adult) (pediatric): Secondary | ICD-10-CM

## 2024-03-14 MED ORDER — ZOLPIDEM TARTRATE 10 MG PO TABS: 10.0000 mg | ORAL_TABLET | Freq: Every evening | ORAL | 0 refills | Status: DC | PRN

## 2024-03-14 NOTE — Telephone Encounter (Signed)
 Sent!

## 2024-03-20 ENCOUNTER — Ambulatory Visit: Admitting: Gastroenterology

## 2024-03-23 ENCOUNTER — Telehealth: Admitting: Primary Care

## 2024-03-28 ENCOUNTER — Other Ambulatory Visit: Payer: Self-pay

## 2024-03-28 ENCOUNTER — Other Ambulatory Visit: Payer: Self-pay | Admitting: Internal Medicine

## 2024-03-28 NOTE — Telephone Encounter (Signed)
 Of note, patient canceled and has not rescheduled her follow up visit. Cancellation reason marked as "Moved".   Per pharmacy her plan only covers #15 tabs Ambien  10mg  per month. He recommends complete PA if patient needs to take daily.   Please advise, thanks!

## 2024-03-30 MED ORDER — ZOLPIDEM TARTRATE 10 MG PO TABS: 10.0000 mg | ORAL_TABLET | Freq: Every evening | ORAL | 0 refills | Status: DC | PRN

## 2024-03-30 NOTE — Telephone Encounter (Signed)
 Patient needs follow up to review sleep study results, she has mild-moderate OSA  Rx ambien  sent for 30 days without refills

## 2024-04-04 NOTE — Telephone Encounter (Signed)
 I updated the prescription, I wrote for her to take 10mg  at bedtime with quantity of 30 tablets   She also needs a follow-up visit in person for further refill and also to review sleep study results. We have only seen her once for original sleep consult in March. She had a sleep study that showed mild-moderate OSA and we need to discuss treatment options

## 2024-04-04 NOTE — Telephone Encounter (Signed)
 From 03/28/24: Me  03/28/24  2:14 PM Note Of note, patient canceled and has not rescheduled her follow up visit. Cancellation reason marked as "Moved".    Per pharmacy her plan only covers #15 tabs Ambien  10mg  per month. He recommends complete PA if patient needs to take daily.    Please advise, thanks!     Patient is confused as to why we keep sending #30 when her insurance only covers #15 per month.  She is also confused about scheduling a follow up visit. She has not received CPAP yet.   Per chart referral for CPAP placed w/[facility] on 03/14/24. Not sent to [facility] until 04/03/24. Contacted [facility] today and they have not opened account for patient as yet.   Please advise, thank you!

## 2024-04-12 ENCOUNTER — Encounter: Payer: Self-pay | Admitting: *Deleted

## 2024-04-12 ENCOUNTER — Encounter: Payer: Self-pay | Admitting: Gastroenterology

## 2024-04-12 ENCOUNTER — Other Ambulatory Visit: Payer: Self-pay | Admitting: *Deleted

## 2024-04-12 ENCOUNTER — Ambulatory Visit: Admitting: Gastroenterology

## 2024-04-12 VITALS — BP 120/79 | HR 81 | Temp 98.4°F | Ht 66.0 in | Wt 190.0 lb

## 2024-04-12 DIAGNOSIS — D509 Iron deficiency anemia, unspecified: Secondary | ICD-10-CM

## 2024-04-12 MED ORDER — PEG 3350-KCL-NA BICARB-NACL 420 G PO SOLR
4000.0000 mL | Freq: Once | ORAL | 0 refills | Status: AC
Start: 2024-04-12 — End: 2024-04-12

## 2024-04-12 NOTE — Progress Notes (Addendum)
 Gastroenterology Office Note     Primary Care Physician:  Trudy Vaughn FALCON, MD  Primary Gastroenterologist: Dr. Cinderella    Chief Complaint   Chief Complaint  Patient presents with   Follow-up    Follow up from ED visit in April. Pt states she is doing better     History of Present Illness   Joann White is a 48 y.o. female presenting today with a history of  COPD, Bell's palsy, sickle cell trait, IDA, recently admitted April 2025 at [facility] due to melena and concern for UGI bleed. She presented with Hgb 6.8, previously in 11 range. Received 1 unit PRBC transfusion with appropriate response. EGD while inpatient with normal esophagus, gastritis, negative H.pylori. She is here for hospital follow-up and to discuss colonoscopy due to history of IDA.   Discharge Hgb 8.7, with repeat as outpatient 8.9. Markedly low ferritin at 2 during hospitalization. Feels fatigued. Used to eat lots of ice then took ibuprofen  larger amounts due to tooth aches prior to admission. No further pica. She has had recent labs with Dr. Trudy, which we will request. She endorses larger amounts of ibuprofen  prior to admission and completely avoiding this now.   Followed up with Dr. Trudy and now taking pantoprazole  once a day. Iron  pill has been cut down to once per day. Rare abdominal pain. Has noticed when eating red meat has pain in abdomen and bowels are dark when eating red meat. Avoiding red meat. Ok with chicken and fish. No overt GI bleeding. Stool is dark green or brown. No constipation. Some mild nausea but hx of chronic sinuses and sinus drainage. Avoiding aspirin and Ibuprofen . Only tylenol  products.   Has heavy menstrual cycles and 10 days length. Discussing hysterectomy with GYN.   EGD April 2025: normal esophagus, gastritis s/p biopsy, normal duodenum. Negative H.pylori.   Past Medical History:  Diagnosis Date   Allergic rhinitis    Anemia    blood transfusion on 04/09/2020    Anxiety 2022   Arnold-Chiari malformation, type I (HCC)    Bell's palsy 11/12/2017   Chickenpox    Chronic daily headache    COPD (chronic obstructive pulmonary disease) (HCC)    mild   Depression 2022   Essential hypertension    History of blood transfusion 04/09/2020   Low back pain 2020   low back pain   Menorrhagia 2022   Palpitations 10/29/2021   Cardiologist Dr. Jayson Concha, LOV 10/29/21. Patient wore a long term heart monitor that demonstrated rare PACs and PVCs and a limited episode of junctional tachycardia. No treatment required unless palpitations were bothersome. Patient prescribed Metoprol 25 mg 24 hour 1/2 tablet daily.   Paresthesia 11/2021   Patient saw neurologist, Dr. Tonita Blanch on 12/04/21 (see MD  note in Epic) for parathesia and whole body pain. On 01/05/22 the patient had a NCW w/ EMG that was normal.   Sciatica 2020   chronic low back pain with sciatica   Sickle cell trait [facility])    Uterine leiomyoma     Past Surgical History:  Procedure Laterality Date   BRONCHOSCOPY     around 2009 per pt, pt had right-sided chest pain, pt stated results were normal   ESOPHAGOGASTRODUODENOSCOPY N/A 02/20/2024   Procedure: EGD (ESOPHAGOGASTRODUODENOSCOPY);  Surgeon: Cindie Carlin POUR, DO;  [facility];  Service: Endoscopy;  Laterality: N/A;    Current Outpatient Medications  Medication Sig Dispense Refill   acetaZOLAMIDE  (DIAMOX ) 250  MG tablet Take 1 tablet (250 mg total) by mouth 2 (two) times daily. 180 tablet 4   amLODipine  (NORVASC ) 10 MG tablet Take 10 mg by mouth daily as needed (high blood pressure).     Calcium-Magnesium-Vitamin D (CALCIUM MAGNESIUM PO) Take 1 tablet by mouth daily.     ferrous sulfate  325 (65 FE) MG tablet Take 325 mg by mouth daily.     levothyroxine  (SYNTHROID ) 25 MCG tablet Take 1 tablet (25 mcg total) by mouth daily before breakfast. 90 tablet 3   tiZANidine  (ZANAFLEX ) 2 MG tablet Take 1 tablet (2 mg total) by mouth at bedtime as  needed for muscle spasms (relaxation). 30 tablet 11   tranexamic acid (LYSTEDA) 650 MG TABS tablet Take 1,300 mg by mouth 3 (three) times daily.     UNKNOWN TO PATIENT Take 2 tablets by mouth 2 (two) times daily as needed (allergies). OTC Allergy Relief     pantoprazole  (PROTONIX ) 40 MG tablet Take 1 tablet (40 mg total) by mouth 2 (two) times daily. 60 tablet 0   potassium chloride  SA (KLOR-CON  M) 20 MEQ tablet Take 1 tablet (20 mEq total) by mouth daily. 30 tablet 0   zolpidem  (AMBIEN ) 10 MG tablet Take 1 tablet (10 mg total) by mouth at bedtime as needed for sleep. (Patient not taking: Reported on 04/12/2024) 30 tablet 0   No current facility-administered medications for this visit.    Allergies as of 04/12/2024 - Review Complete 04/12/2024  Allergen Reaction Noted   Dilaudid [hydromorphone] Nausea And Vomiting and Other (See Comments) 03/01/2012    Family History  Problem Relation Age of Onset   Hypertension Mother    COPD Mother    Bronchitis Mother    Cancer Father    Alcoholism Father    Lung cancer Father    Sickle cell anemia Brother    Cancer Paternal Aunt        unknow type   Cancer Paternal Aunt        breast    Social History   Socioeconomic History   Marital status: Married    Spouse name: Alm   Number of children: 1   Years of education: Not on file   Highest education level: Some college, no degree  Occupational History   Not on file  Tobacco Use   Smoking status: Former    Current packs/day: 2.00    Average packs/day: 2.0 packs/day for 30.0 years (60.0 ttl pk-yrs)    Types: Cigarettes   Smokeless tobacco: Never   Tobacco comments:    Quit 01-07-23  Vaping Use   Vaping status: Some Days   Start date: 02/06/2023   Substances: Nicotine, Flavoring  Substance and Sexual Activity   Alcohol use: Yes    Comment: Occasional Wine, less than one drink per month   Drug use: Yes    Types: Marijuana    Comment: few times per month   Sexual activity: Not on  file  Other Topics Concern   Not on file  Social History Narrative   Lives with husband in a one story home.  Has one child.     Works as a Museum/gallery conservator.     Education: some college.   Caffeine- coffee 2-3 x week, 2 small sodas a day   Right handed   Social Drivers of Health   Financial Resource Strain: Not on file  Food Insecurity: No Food Insecurity (02/19/2024)   Hunger Vital Sign    Worried About  Running Out of Food in the Last Year: Never true    Ran Out of Food in the Last Year: Never true  Transportation Needs: No Transportation Needs (02/19/2024)   PRAPARE - Administrator, Civil Service (Medical): No    Lack of Transportation (Non-Medical): No  Physical Activity: Not on file  Stress: Not on file  Social Connections: Not on file  Intimate Partner Violence: Not At Risk (02/19/2024)   Humiliation, Afraid, Rape, and Kick questionnaire    Fear of Current or Ex-Partner: No    Emotionally Abused: No    Physically Abused: No    Sexually Abused: No     Review of Systems   Gen: Denies any fever, chills, fatigue, weight loss, lack of appetite.  CV: Denies chest pain, heart palpitations, peripheral edema, syncope.  Resp: Denies shortness of breath at rest or with exertion. Denies wheezing or cough.  GI: Denies dysphagia or odynophagia. Denies jaundice, hematemesis, fecal incontinence. GU : Denies urinary burning, urinary frequency, urinary hesitancy MS: Denies joint pain, muscle weakness, cramps, or limitation of movement.  Derm: Denies rash, itching, dry skin Psych: Denies depression, anxiety, memory loss, and confusion Heme: Denies bruising, bleeding, and enlarged lymph nodes.   Physical Exam   BP 120/79   Pulse 81   Temp 98.4 F (36.9 C)   Ht 5' 6 (1.676 m)   Wt 190 lb (86.2 kg)   LMP 04/07/2024   BMI 30.67 kg/m  General:   Alert and oriented. Pleasant and cooperative. Well-nourished and well-developed.  Head:  Normocephalic and  atraumatic. Eyes:  Without icterus Abdomen:  +BS, soft, non-tender and non-distended. No HSM noted. No guarding or rebound. No masses appreciated.  Rectal:  Deferred  Msk:  Symmetrical without gross deformities. Normal posture. Extremities:  Without edema. Neurologic:  Alert and  oriented x4;  grossly normal neurologically. Skin:  Intact without significant lesions or rashes. Psych:  Alert and cooperative. Normal mood and affect.   Assessment   Joann White is a 48 y.o. female presenting today with a history of COPD, Bell's palsy, sickle cell trait, IDA, recently admitted April 2025 at [facility] due to melena and concern for UGI bleed, presenting with Hgb 6.8, down from 11 range prior, undergoing EGD with gastritis and negative H.pylori. Due to profound IDA with ferritin 2, she is here to discuss colonoscopy.  IDA: acute on chronic anemia while inpatient in setting of increased NSAIDs. Could have had NSAID insult to anywhere in the GI tract and now avoiding this completely. EGD as noted was unrevealing. Received 1 unit PRBCs while inpatient. Will obtain outside labs from PCP, which have been done routinely since hospitalization. Suspect menorrhagia also contributing. Will arrange diagnostic colonoscopy due to IDA.    PLAN    Continue pantoprazole  once daily Proceed with colonoscopy by Dr. Cinderella  in near future: the risks, benefits, and alternatives have been discussed with the patient in detail. The patient states understanding and desires to proceed.  Hold iron  7 days prior Pregnancy test prior Obtain outside labs from PCP: may need to refer to Hematology Consider capsule if colonoscopy unrevealing and persistent IDA Continue to avoid NSAIDs as she is doing   Therisa MICAEL Stager, PhD, ANP-BC [facility]   Addendum 6/23: outside labs from May 2025 reviewed. Hgb continues to improve and 10.5. WE are requesting iron  studies.

## 2024-04-12 NOTE — Patient Instructions (Signed)
 Continue pantoprazole  once a day as you are doing.  We are arranging a colonoscopy with Dr. Alita Irwin in the near future! You will need to stop iron  7 days prior.   I am requesting labs from your primary care. Continue to avoid aspirin, Ibuprofen , etc.   Further recommendations to follow!  I enjoyed seeing you again today! I value our relationship and want to provide genuine, compassionate, and quality care. You may receive a survey regarding your visit with me, and I welcome your feedback! Thanks so much for taking the time to complete this. I look forward to seeing you again.      Delman Ferns, PhD, ANP-BC [facility]

## 2024-04-18 ENCOUNTER — Telehealth: Payer: Self-pay | Admitting: *Deleted

## 2024-04-18 NOTE — Telephone Encounter (Signed)
 [facility] and spoke with Ladonna, No PA is required for TCS ref: [id number]/11/25

## 2024-04-19 ENCOUNTER — Other Ambulatory Visit: Payer: Self-pay | Admitting: Neurosurgery

## 2024-04-24 LAB — ALPHA-GAL PANEL
Allergen Lamb IgE: <0.1 kU/L
Beef IgE: <0.1 kU/L
IgE (Immunoglobulin E), Serum: 6 [IU]/mL (ref 6–495)
O215-IgE Alpha-Gal: <0.1 kU/L
Pork IgE: <0.1 kU/L

## 2024-04-25 ENCOUNTER — Other Ambulatory Visit: Payer: Self-pay | Admitting: Internal Medicine

## 2024-04-30 ENCOUNTER — Ambulatory Visit: Payer: Self-pay | Admitting: Gastroenterology

## 2024-05-01 ENCOUNTER — Encounter: Payer: Self-pay | Admitting: Cardiology

## 2024-05-02 ENCOUNTER — Other Ambulatory Visit: Payer: Self-pay

## 2024-05-02 DIAGNOSIS — D509 Iron deficiency anemia, unspecified: Secondary | ICD-10-CM

## 2024-05-02 NOTE — Telephone Encounter (Signed)
 noted

## 2024-05-03 LAB — IRON,TIBC AND FERRITIN PANEL
Ferritin: 25 ng/mL (ref 15–150)
Iron Saturation: 12 % — ABNORMAL LOW (ref 15–55)
Iron: 41 ug/dL (ref 27–159)
Total Iron Binding Capacity: 346 ug/dL (ref 250–450)
UIBC: 305 ug/dL (ref 131–425)

## 2024-05-04 ENCOUNTER — Telehealth: Payer: Self-pay | Admitting: Cardiology

## 2024-05-04 NOTE — Telephone Encounter (Signed)
 From Francina Larve:  Dr. Trudy of [facility] called to to inform pre-op department that patient's OB office [name]) will be performing a hysterectomy--see referral for further detail. Dr. Trudy was advised to find out if pre-op evaluation can be scheduled sooner than 7/31. MD says he doesn't need a returned call. Please contact patient and requesting office for scheduling and updates on clearance.     We need to contact the office to request a clearance form so that we can send to surgeon. Don't see referral letter.

## 2024-05-04 NOTE — Telephone Encounter (Signed)
 Dr. Trudy of [facility] called to to inform pre-op department that patient's OB office ([facility]ior[facility]) will be performing a hysterectomy--see referral for further detail. Dr. Trudy was advised to find out if pre-op evaluation can be scheduled sooner than 7/31. MD says he doesn't need a returned call. Please contact patient and requesting office for scheduling and updates on clearance.

## 2024-05-04 NOTE — Telephone Encounter (Signed)
 PCP office closed for the day tried looking for OB office number who is the office requesting clearance but couldn't find a number will need to call PCP office on Monday to ask if they can provide us  with a number to make them aware we need a clearance request fax to our office

## 2024-05-07 NOTE — Telephone Encounter (Signed)
 I s/w Dr. Hadassah D'iorio office who tells me she is no longer in their practice. I was told the pt is now following Dr. Linnell [facility].   Dr. Linnell office will fax over the clearance request that is needed for cardiac clearance. I provided [phone] ATTN; preop team fax#

## 2024-05-07 NOTE — Telephone Encounter (Signed)
   Pre-operative Risk Assessment    Patient Name: Joann White  DOB: 04-12-1976 MRN: [id number]   Date of last office visit: 10/29/21 DR. McDOWELL Date of next office visit: 06/07/24 Joann CRATE, NP   Request for Surgical Clearance    Procedure:  TOTAL LAPAROSCOPIC HYSTERECTOMY, B/L SALPINGECTOMY  Date of Surgery:  Clearance TBD                                Surgeon:  DR. DEVERE BRAVE Surgeon's Group or Practice Name:  [facility] Phone number:  [phone] Fax number:  [phone] ATTN: JESSICA S.    Type of Clearance Requested:   - Medical ; NONE INDICATED TO BE HELD   Type of Anesthesia:  Not Indicated   Additional requests/questions:    [name] Jest   05/07/2024, 12:32 PM

## 2024-05-07 NOTE — Telephone Encounter (Signed)
   Name: AVILENE MARRIN  DOB: May 07, 1976  MRN: [id number]  Primary Cardiologist: Jayson Sierras, MD  Chart reviewed as part of pre-operative protocol coverage. The patient has an upcoming visit scheduled with Almarie Crate, NP on 06/07/2024 at which time clearance can be addressed in case there are any issues that would impact surgical recommendations.  I added preop FYI to appointment note so that provider is aware to address at time of outpatient visit.  Per office protocol the cardiology provider should forward their finalized clearance decision and recommendations regarding antiplatelet therapy to the requesting party below.    I will route this message as FYI to requesting party and remove this message from the preop box as separate preop APP input not needed at this time.   Please call with any questions.  Wyn Raddle, Jackee Shove, NP  05/07/2024, 12:41 PM

## 2024-05-09 ENCOUNTER — Ambulatory Visit (INDEPENDENT_AMBULATORY_CARE_PROVIDER_SITE_OTHER): Admitting: Otolaryngology

## 2024-05-09 ENCOUNTER — Encounter (INDEPENDENT_AMBULATORY_CARE_PROVIDER_SITE_OTHER): Payer: Self-pay | Admitting: Otolaryngology

## 2024-05-09 VITALS — BP 129/90 | HR 80 | Ht 66.0 in | Wt 189.0 lb

## 2024-05-09 DIAGNOSIS — J310 Chronic rhinitis: Secondary | ICD-10-CM

## 2024-05-09 DIAGNOSIS — J343 Hypertrophy of nasal turbinates: Secondary | ICD-10-CM | POA: Diagnosis not present

## 2024-05-09 DIAGNOSIS — J3489 Other specified disorders of nose and nasal sinuses: Secondary | ICD-10-CM | POA: Diagnosis not present

## 2024-05-09 DIAGNOSIS — J324 Chronic pansinusitis: Secondary | ICD-10-CM

## 2024-05-09 DIAGNOSIS — Z87891 Personal history of nicotine dependence: Secondary | ICD-10-CM

## 2024-05-09 MED ORDER — FLUTICASONE PROPIONATE 50 MCG/ACT NA SUSP: 2.0000 | Freq: Every day | NASAL | 10 refills | Status: DC

## 2024-05-10 ENCOUNTER — Other Ambulatory Visit (HOSPITAL_COMMUNITY)
Admission: RE | Admit: 2024-05-10 | Discharge: 2024-05-10 | Disposition: A | Discharge status: Home / Self Care | Source: Ambulatory Visit | Attending: Gastroenterology | Admitting: Gastroenterology

## 2024-05-10 DIAGNOSIS — D509 Iron deficiency anemia, unspecified: Secondary | ICD-10-CM | POA: Diagnosis present

## 2024-05-10 DIAGNOSIS — J324 Chronic pansinusitis: Secondary | ICD-10-CM | POA: Insufficient documentation

## 2024-05-10 DIAGNOSIS — J343 Hypertrophy of nasal turbinates: Secondary | ICD-10-CM | POA: Insufficient documentation

## 2024-05-10 LAB — PREGNANCY, URINE: Preg Test, Ur: NEGATIVE

## 2024-05-10 NOTE — Progress Notes (Signed)
 CC: Recurrent sinusitis  HPI:  Joann White is a 48 y.o. female who presents today for her initial consultation. - Presents with a long-standing history of sinus problems since approximately 2017, which have reportedly worsened over time. - Reports frequent sinusitis. Experiences constant pressure in the face, extending from the nose to the top of the head. This pressure is exacerbated by bending over or changing directions. Reports a horrible, foul odor in the nose, though this has been less severe in recent months. Feels as though there is a constant sinus infection. Reports nasal congestion, which is on the right side today. Also reports significant rhinorrhea, requiring constant use of tissues. - Uses Benadryl  and OTC medications as needed. - Reports having a head CT scan in 2023 which showed complete opacification of the left maxillary sinus.  Has seen an ENT specialist in [place] since the scan. - Denies any previous ear, nose, or throat surgery. - Reports that the sinus issues are the main problem, but also experiences significant eye watering. - Reports a history of environmental allergies.  Past Medical History:  Diagnosis Date   Allergic rhinitis    Anemia    blood transfusion on 04/09/2020   Anxiety 2022   Arnold-Chiari malformation, type I (HCC)    Bell's palsy 11/12/2017   Chickenpox    Chronic daily headache    COPD (chronic obstructive pulmonary disease) (HCC)    mild   Depression 2022   Essential hypertension    History of blood transfusion 04/09/2020   Low back pain 2020   low back pain   Menorrhagia 2022   Palpitations 10/29/2021   Cardiologist Dr. Jayson Concha, LOV 10/29/21. Patient wore a long term heart monitor that demonstrated rare PACs and PVCs and a limited episode of junctional tachycardia. No treatment required unless palpitations were bothersome. Patient prescribed Metoprol 25 mg 24 hour 1/2 tablet daily.   Paresthesia 11/2021   Patient saw  neurologist, Dr. Tonita Blanch on 12/04/21 (see MD  note in Epic) for parathesia and whole body pain. On 01/05/22 the patient had a NCW w/ EMG that was normal.   Sciatica 2020   chronic low back pain with sciatica   Sickle cell trait [facility])    Uterine leiomyoma     Past Surgical History:  Procedure Laterality Date   BRONCHOSCOPY     around 2009 per pt, pt had right-sided chest pain, pt stated results were normal   ESOPHAGOGASTRODUODENOSCOPY N/A 02/20/2024   Procedure: EGD (ESOPHAGOGASTRODUODENOSCOPY);  Surgeon: Cindie Carlin POUR, DO;  [facility];  Service: Endoscopy;  Laterality: N/A;    Family History  Problem Relation Age of Onset   Hypertension Mother    COPD Mother    Bronchitis Mother    Cancer Father    Alcoholism Father    Lung cancer Father    Diverticulosis Father    Sickle cell anemia Brother    Cancer Paternal Aunt        unknow type   Cancer Paternal Aunt        breast   Colon cancer Neg Hx    Colon polyps Neg Hx     Social History:  reports that she has quit smoking. Her smoking use included cigarettes. She has a 60 pack-year smoking history. She has never used smokeless tobacco. She reports current alcohol use. She reports that she does not currently use drugs after having used the following drugs: Marijuana.  Allergies:  Allergies  Allergen Reactions  Dilaudid [Hydromorphone] Nausea And Vomiting and Other (See Comments)    Dizziness     Prior to Admission medications   Medication Sig Start Date End Date Taking? Authorizing Provider  acetaZOLAMIDE  (DIAMOX ) 250 MG tablet Take 1 tablet (250 mg total) by mouth 2 (two) times daily. 03/05/24  Yes Ines Onetha NOVAK, MD  amLODipine  (NORVASC ) 10 MG tablet Take 10 mg by mouth daily as needed (high blood pressure).   Yes [provider]  Calcium-Magnesium-Vitamin D (CALCIUM MAGNESIUM PO) Take 1 tablet by mouth daily.   Yes [provider]  ferrous sulfate  325 (65 FE) MG tablet Take 325 mg by  mouth daily. 03/14/24  Yes [provider]  fluticasone (FLONASE) 50 MCG/ACT nasal spray Place 2 sprays into both nostrils daily. 05/09/24 06/08/24 Yes Karis Clunes, MD  levothyroxine  (SYNTHROID ) 25 MCG tablet TAKE 1 TABLET BY MOUTH EVERY DAY BEFORE BREAKFAST 04/26/24  Yes Shamleffer, Ibtehal Jaralla, MD  pantoprazole  (PROTONIX ) 40 MG tablet Take 1 tablet (40 mg total) by mouth 2 (two) times daily. Patient taking differently: Take 40 mg by mouth daily. 02/20/24 05/09/24 Yes Arrien, Elidia Sieving, MD  tiZANidine  (ZANAFLEX ) 2 MG tablet Take 1 tablet (2 mg total) by mouth at bedtime as needed for muscle spasms (relaxation). 03/05/24  Yes Ines Onetha NOVAK, MD  tranexamic acid (LYSTEDA) 650 MG TABS tablet Take 1,300 mg by mouth 3 (three) times daily. 03/05/24  Yes [provider]  UNKNOWN TO PATIENT Take 2 tablets by mouth 2 (two) times daily as needed (allergies). OTC Allergy Relief   Yes [provider]    Blood pressure (!) 129/90, pulse 80, height 5' 6 (1.676 m), weight 189 lb (85.7 kg), last menstrual period 04/07/2024, SpO2 98%. Exam: General: Communicates without difficulty, well nourished, no acute distress. Head: Normocephalic, no evidence injury, no tenderness, facial buttresses intact without stepoff. Face/sinus: No tenderness to palpation and percussion. Facial movement is normal and symmetric. Eyes: PERRL, EOMI. No scleral icterus, conjunctivae clear. Neuro: CN II exam reveals vision grossly intact.  No nystagmus at any point of gaze. Ears: Auricles well formed without lesions.  Ear canals are intact without mass or lesion.  No erythema or edema is appreciated.  The TMs are intact without fluid. Nose: External evaluation reveals normal support and skin without lesions.  Dorsum is intact.  Anterior rhinoscopy reveals congested mucosa over anterior aspect of inferior turbinates and intact septum.  Oral:  Oral cavity and oropharynx are intact, symmetric, without erythema or edema.   Mucosa is moist without lesions. Neck: Full range of motion without pain.  There is no significant lymphadenopathy.  No masses palpable.  Thyroid  bed within normal limits to palpation.  Parotid glands and submandibular glands equal bilaterally without mass.  Trachea is midline. Neuro:  CN 2-12 grossly intact.   Procedure:  Flexible Nasal Endoscopy: Description: Risks, benefits, and alternatives of flexible endoscopy were explained to the patient.  Specific mention was made of the risk of throat numbness with difficulty swallowing, possible bleeding from the nose and mouth, and pain from the procedure.  The patient gave oral consent to proceed.  The flexible scope was inserted into the right nasal cavity.  Endoscopy of the interior nasal cavity, superior, inferior, and middle meatus was performed. The sphenoid-ethmoid recess was examined. Edematous mucosa was noted.  No polyp, mass, or lesion was appreciated.  Olfactory cleft was clear.  Nasopharynx was clear.  Turbinates were hypertrophied but without mass.  The procedure was repeated on the contralateral  side with similar findings.  The patient tolerated the procedure well.   Assessment: - Chronic rhinosinusitis. 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  Plan: - The physical exam and nasal endoscopy findings are reviewed with the patient. - A CT scan of the sinuses will be ordered to re-evaluate the status of the sinuses, particularly the left-sided opacification seen on the 2023 scan. The scan will be performed at [facility] in [place]. - A prescription for Flonase nasal spray will be sent to the [facility] on [address] in [place].  The importance of consistent daily use is discussed. - Counselled on initiating saline nasal irrigations. Advised to purchase an irrigation kit (e.g., NeilMed, AYR) from a pharmacy or grocery store and use it with bottled or distilled water to help flush the nasal passages. - Will  follow up to review the results of the CT scan. If the scan shows significant infection, a course of antibiotics will be prescribed at that time. If the findings persist, surgical intervention may be necessary.  Vernon Maish W Pryor Guettler 05/10/2024, 10:20 AM

## 2024-05-15 ENCOUNTER — Ambulatory Visit (HOSPITAL_COMMUNITY): Admitting: Certified Registered"

## 2024-05-15 ENCOUNTER — Encounter (HOSPITAL_COMMUNITY): Payer: Self-pay | Admitting: Gastroenterology

## 2024-05-15 ENCOUNTER — Other Ambulatory Visit: Payer: Self-pay

## 2024-05-15 ENCOUNTER — Ambulatory Visit (HOSPITAL_COMMUNITY)
Admission: RE | Admit: 2024-05-15 | Discharge: 2024-05-15 | Disposition: A | Discharge status: Home / Self Care | Attending: Gastroenterology | Admitting: Gastroenterology

## 2024-05-15 ENCOUNTER — Encounter (HOSPITAL_COMMUNITY)
Admission: RE | Disposition: A | Discharge status: Home / Self Care | Payer: Self-pay | Source: Home / Self Care | Attending: Gastroenterology

## 2024-05-15 DIAGNOSIS — D573 Sickle-cell trait: Secondary | ICD-10-CM | POA: Insufficient documentation

## 2024-05-15 DIAGNOSIS — G510 Bell's palsy: Secondary | ICD-10-CM | POA: Diagnosis not present

## 2024-05-15 DIAGNOSIS — K648 Other hemorrhoids: Secondary | ICD-10-CM | POA: Diagnosis not present

## 2024-05-15 DIAGNOSIS — Z79890 Hormone replacement therapy (postmenopausal): Secondary | ICD-10-CM | POA: Diagnosis not present

## 2024-05-15 DIAGNOSIS — Z87891 Personal history of nicotine dependence: Secondary | ICD-10-CM | POA: Diagnosis not present

## 2024-05-15 DIAGNOSIS — D500 Iron deficiency anemia secondary to blood loss (chronic): Secondary | ICD-10-CM | POA: Insufficient documentation

## 2024-05-15 DIAGNOSIS — K640 First degree hemorrhoids: Secondary | ICD-10-CM

## 2024-05-15 DIAGNOSIS — I10 Essential (primary) hypertension: Secondary | ICD-10-CM | POA: Insufficient documentation

## 2024-05-15 DIAGNOSIS — E039 Hypothyroidism, unspecified: Secondary | ICD-10-CM | POA: Diagnosis not present

## 2024-05-15 DIAGNOSIS — J449 Chronic obstructive pulmonary disease, unspecified: Secondary | ICD-10-CM | POA: Diagnosis not present

## 2024-05-15 HISTORY — DX: Sleep apnea, unspecified: G47.30

## 2024-05-15 HISTORY — PX: COLONOSCOPY: SHX5424

## 2024-05-15 LAB — HM COLONOSCOPY

## 2024-05-15 SURGERY — COLONOSCOPY
Anesthesia: General

## 2024-05-15 MED ORDER — LIDOCAINE 2% (20 MG/ML) 5 ML SYRINGE
INTRAMUSCULAR | Status: DC | PRN
  Administered 2024-05-15: 60 mg via INTRAVENOUS

## 2024-05-15 MED ORDER — PROPOFOL 10 MG/ML IV BOLUS
INTRAVENOUS | Status: DC | PRN
Start: 2024-05-15 — End: 2024-05-15
  Administered 2024-05-15: 125 ug/kg/min via INTRAVENOUS
  Administered 2024-05-15: 100 mg via INTRAVENOUS

## 2024-05-15 MED ORDER — LACTATED RINGERS IV SOLN: INTRAVENOUS | Status: DC | PRN

## 2024-05-15 MED ORDER — PHENYLEPHRINE 80 MCG/ML (10ML) SYRINGE FOR IV PUSH (FOR BLOOD PRESSURE SUPPORT)
PREFILLED_SYRINGE | INTRAVENOUS | Status: DC | PRN
  Administered 2024-05-15: 160 ug via INTRAVENOUS

## 2024-05-15 NOTE — Transfer of Care (Signed)
 Immediate Anesthesia Transfer of Care Note  Patient: Joann White  Procedure(s) Performed: COLONOSCOPY  Patient Location: Endoscopy Unit  Anesthesia Type:General  Level of Consciousness: drowsy and patient cooperative  Airway & Oxygen Therapy: Patient Spontanous Breathing  Post-op Assessment: Report given to RN and Post -op Vital signs reviewed and stable  Post vital signs: Reviewed and stable  Last Vitals:  Vitals Value Taken Time  BP 103/61 05/15/24 14:05  Temp 36.5 C 05/15/24 14:05  Pulse 64 05/15/24 14:05  Resp 17 05/15/24 14:05  SpO2 100 % 05/15/24 14:05    Last Pain:  Vitals:   05/15/24 1405  TempSrc: Oral  PainSc: 0-No pain      Patients Stated Pain Goal: 9 (05/15/24 1250)  Complications: No notable events documented.

## 2024-05-15 NOTE — H&P (Signed)
 Primary Care Physician:  Trudy Vaughn FALCON, MD Primary Gastroenterologist:  Dr. Cinderella  Pre-Procedure History & Physical: HPI:  Joann White is a 48 y.o. female is here for a colonoscopy for evaluation of Iron  deficiency anemia .   history of  COPD, Bell's palsy, sickle cell trait, IDA,  admitted April 2025 at [facility] due to melena and concern for UGI bleed. She presented with Hgb 6.8, previously in 11 range. Received 1 unit PRBC transfusion with appropriate response. EGD while inpatient with normal esophagus, gastritis, negative H.pylori.   Past Medical History:  Diagnosis Date   Allergic rhinitis    Anemia    blood transfusion on 04/09/2020   Anxiety 2022   Arnold-Chiari malformation, type I (HCC)    Bell's palsy 11/12/2017   Chickenpox    Chronic daily headache    COPD (chronic obstructive pulmonary disease) (HCC)    mild   Depression 2022   Essential hypertension    History of blood transfusion 04/09/2020   Low back pain 2020   low back pain   Menorrhagia 2022   Palpitations 10/29/2021   Cardiologist Dr. Jayson Concha, LOV 10/29/21. Patient wore a long term heart monitor that demonstrated rare PACs and PVCs and a limited episode of junctional tachycardia. No treatment required unless palpitations were bothersome. Patient prescribed Metoprol 25 mg 24 hour 1/2 tablet daily.   Paresthesia 11/2021   Patient saw neurologist, Dr. Tonita Blanch on 12/04/21 (see MD  note in Epic) for parathesia and whole body pain. On 01/05/22 the patient had a NCW w/ EMG that was normal.   Sciatica 2020   chronic low back pain with sciatica   Sickle cell trait (HCC)    Sleep apnea    Uterine leiomyoma     Past Surgical History:  Procedure Laterality Date   BRONCHOSCOPY     around 2009 per pt, pt had right-sided chest pain, pt stated results were normal   ESOPHAGOGASTRODUODENOSCOPY N/A 02/20/2024   Procedure: EGD (ESOPHAGOGASTRODUODENOSCOPY);  Surgeon: Cindie Carlin POUR, DO;  Location[facility];  Service: Endoscopy;  Laterality: N/A;    Prior to Admission medications   Medication Sig Start Date End Date Taking? Authorizing Provider  acetaZOLAMIDE  (DIAMOX ) 250 MG tablet Take 1 tablet (250 mg total) by mouth 2 (two) times daily. 03/05/24  Yes Ines Onetha NOVAK, MD  amLODipine  (NORVASC ) 10 MG tablet Take 10 mg by mouth daily as needed (high blood pressure).   Yes [provider]  fluticasone  (FLONASE ) 50 MCG/ACT nasal spray Place 2 sprays into both nostrils daily. 05/09/24 06/08/24 Yes Karis Clunes, MD  levothyroxine  (SYNTHROID ) 25 MCG tablet TAKE 1 TABLET BY MOUTH EVERY DAY BEFORE BREAKFAST 04/26/24  Yes Shamleffer, Ibtehal Jaralla, MD  tiZANidine  (ZANAFLEX ) 2 MG tablet Take 1 tablet (2 mg total) by mouth at bedtime as needed for muscle spasms (relaxation). 03/05/24  Yes Ines Onetha NOVAK, MD  UNKNOWN TO PATIENT Take 2 tablets by mouth 2 (two) times daily as needed (allergies). OTC Allergy Relief   Yes [provider]  Calcium-Magnesium-Vitamin D (CALCIUM MAGNESIUM PO) Take 1 tablet by mouth daily.    [provider]  ferrous sulfate  325 (65 FE) MG tablet Take 325 mg by mouth daily. 03/14/24   [provider]  pantoprazole  (PROTONIX ) 40 MG tablet Take 1 tablet (40 mg total) by mouth 2 (two) times daily. Patient taking differently: Take 40 mg by mouth daily. 02/20/24 05/09/24  Arrien, Mauricio Daniel, MD  tranexamic acid (LYSTEDA) 650  MG TABS tablet Take 1,300 mg by mouth 3 (three) times daily. 03/05/24   [provider]    Allergies as of 04/12/2024 - Review Complete 04/12/2024  Allergen Reaction Noted   Dilaudid [hydromorphone] Nausea And Vomiting and Other (See Comments) 03/01/2012    Family History  Problem Relation Age of Onset   Hypertension Mother    COPD Mother    Bronchitis Mother    Cancer Father    Alcoholism Father    Lung cancer Father    Diverticulosis Father    Sickle cell anemia Brother    Cancer Paternal Aunt        unknow type    Cancer Paternal Aunt        breast   Colon cancer Neg Hx    Colon polyps Neg Hx     Social History   Socioeconomic History   Marital status: Married    Spouse name: Alm   Number of children: 1   Years of education: Not on file   Highest education level: Some college, no degree  Occupational History   Not on file  Tobacco Use   Smoking status: Former    Current packs/day: 2.00    Average packs/day: 2.0 packs/day for 30.0 years (60.0 ttl pk-yrs)    Types: Cigarettes   Smokeless tobacco: Never   Tobacco comments:    Quit 01-07-23  Vaping Use   Vaping status: Some Days   Start date: 02/06/2023   Substances: Nicotine, Flavoring  Substance and Sexual Activity   Alcohol use: Yes    Comment: Occasional Wine, less than one drink per month   Drug use: Not Currently    Types: Marijuana   Sexual activity: Not on file  Other Topics Concern   Not on file  Social History Narrative   Lives with husband in a one story home.  Has one child.     Works as a Museum/gallery conservator.     Education: some college.   Caffeine- coffee 2-3 x week, 2 small sodas a day   Right handed   Social Drivers of Corporate investment banker Strain: Not on file  Food Insecurity: No Food Insecurity (02/19/2024)   Hunger Vital Sign    Worried About Running Out of Food in the Last Year: Never true    Ran Out of Food in the Last Year: Never true  Transportation Needs: No Transportation Needs (02/19/2024)   PRAPARE - Administrator, Civil Service (Medical): No    Lack of Transportation (Non-Medical): No  Physical Activity: Not on file  Stress: Not on file  Social Connections: Not on file  Intimate Partner Violence: Not At Risk (02/19/2024)   Humiliation, Afraid, Rape, and Kick questionnaire    Fear of Current or Ex-Partner: No    Emotionally Abused: No    Physically Abused: No    Sexually Abused: No    Review of Systems: See HPI, otherwise negative ROS  Physical Exam: Vital signs in  last 24 hours: Temp:  [98.2 F (36.8 C)] 98.2 F (36.8 C) (07/08 1250) Pulse Rate:  [77] 77 (07/08 1250) Resp:  [12] 12 (07/08 1250) BP: (109)/(84) 109/84 (07/08 1250) SpO2:  [99 %] 99 % (07/08 1250) Weight:  [86.2 kg] 86.2 kg (07/08 1250)   General:   Alert,  Well-developed, well-nourished, pleasant and cooperative in NAD Head:  Normocephalic and atraumatic. Eyes:  Sclera clear, no icterus.   Conjunctiva pink. Ears:  Normal auditory acuity.  Nose:  No deformity, discharge,  or lesions. Msk:  Symmetrical without gross deformities. Normal posture. Extremities:  Without clubbing or edema. Neurologic:  Alert and  oriented x4;  grossly normal neurologically. Skin:  Intact without significant lesions or rashes. Psych:  Alert and cooperative. Normal mood and affect.  Impression/Plan: Joann White is a 48 y.o. female is here for a colonoscopy for evaluation of Iron  deficiency anemia .  The risks of the procedure including infection, bleed, or perforation as well as benefits, limitations, alternatives and imponderables have been reviewed with the patient. Questions have been answered. All parties agreeable.

## 2024-05-15 NOTE — Discharge Instructions (Signed)

## 2024-05-15 NOTE — Anesthesia Preprocedure Evaluation (Signed)
 Anesthesia Evaluation  Patient identified by MRN, date of birth, ID band Patient awake    Reviewed: Allergy & Precautions, H&P , NPO status , Patient's Chart, lab work & pertinent test results, reviewed documented beta blocker date and time   Airway Mallampati: II  TM Distance: >3 FB Neck ROM: full    Dental no notable dental hx. (+) Missing   Pulmonary neg pulmonary ROS, COPD, former smoker   Pulmonary exam normal breath sounds clear to auscultation       Cardiovascular Exercise Tolerance: Good hypertension, negative cardio ROS  Rhythm:Regular Rate:Normal     Neuro/Psych  Headaches PSYCHIATRIC DISORDERS Anxiety Depression     Neuromuscular disease negative neurological ROS  negative psych ROS   GI/Hepatic negative GI ROS, Neg liver ROS,,,  Endo/Other  negative endocrine ROSHypothyroidism    Renal/GU negative Renal ROS  negative genitourinary   Musculoskeletal   Abdominal Normal abdominal exam  (+)   Peds  Hematology negative hematology ROS (+) Blood dyscrasia, anemia   Anesthesia Other Findings   Reproductive/Obstetrics negative OB ROS                              Anesthesia Physical Anesthesia Plan  ASA: 3  Anesthesia Plan: General   Post-op Pain Management:    Induction: Intravenous  PONV Risk Score and Plan: 1 and Propofol  infusion  Airway Management Planned: Nasal Cannula  Additional Equipment:   Intra-op Plan:   Post-operative Plan:   Informed Consent: I have reviewed the patients History and Physical, chart, labs and discussed the procedure including the risks, benefits and alternatives for the proposed anesthesia with the patient or authorized representative who has indicated his/her understanding and acceptance.     Dental Advisory Given  Plan Discussed with: CRNA and Surgeon  Anesthesia Plan Comments:          Anesthesia Quick Evaluation

## 2024-05-15 NOTE — Op Note (Signed)
 [facility] Patient Name: Joann White Procedure Date: 05/15/2024 1:30 PM MRN: [id number] Date of Birth: 22-Oct-1976 Attending MD: Deatrice Dine , MD, [id number] CSN: [id number] Age: 48 Admit Type: Outpatient Procedure:                Colonoscopy Indications:              Iron  deficiency anemia secondary to chronic blood                            loss, Iron  deficiency anemia Providers:                Deatrice Dine, MD, Ashley Goins, Tammy Vaught, RN,                            Kristine L. Shirlean Balm, Technician Referring MD:              Medicines:                Monitored Anesthesia Care Complications:            No immediate complications. Estimated Blood Loss:     Estimated blood loss: none. Procedure:                Pre-Anesthesia Assessment:                           - Prior to the procedure, a History and Physical                            was performed, and patient medications and                            allergies were reviewed. The patient's tolerance of                            previous anesthesia was also reviewed. The risks                            and benefits of the procedure and the sedation                            options and risks were discussed with the patient.                            All questions were answered, and informed consent                            was obtained. Prior Anticoagulants: The patient has                            taken no anticoagulant or antiplatelet agents                            except for NSAID medication. ASA Grade Assessment:  II - A patient with mild systemic disease. After                            reviewing the risks and benefits, the patient was                            deemed in satisfactory condition to undergo the                            procedure.                           After obtaining informed consent, the colonoscope                            was passed under direct  vision. Throughout the                            procedure, the patient's blood pressure, pulse, and                            oxygen saturations were monitored continuously. The                            [id number]) scope was introduced through the                            anus and advanced to the the cecum, identified by                            appendiceal orifice and ileocecal valve. The                            colonoscopy was performed without difficulty. The                            patient tolerated the procedure well. The quality                            of the bowel preparation was evaluated using the                            BBPS [facility] Bowel Preparation Scale) with scores                            of: Right Colon = 3, Transverse Colon = 3 and Left                            Colon = 3 (entire mucosa seen well with no residual                            staining, small fragments of stool or opaque  liquid). The total BBPS score equals 9. The                            ileocecal valve, appendiceal orifice, and rectum                            were photographed. Scope In: 1:47:10 PM Scope Out: 2:01:08 PM Scope Withdrawal Time: 0 hours 10 minutes 32 seconds  Total Procedure Duration: 0 hours 13 minutes 58 seconds  Findings:      The perianal and digital rectal examinations were normal.      There is no endoscopic evidence of bleeding or polyps in the entire       colon.      Non-bleeding internal hemorrhoids were found during retroflexion. The       hemorrhoids were small. Impression:               - Non-bleeding internal hemorrhoids.                           - No specimens collected. Moderate Sedation:      Per Anesthesia Care Recommendation:           - Patient has a contact number available for                            emergencies. The signs and symptoms of potential                            delayed complications were  discussed with the                            patient. Return to normal activities tomorrow.                            Written discharge instructions were provided to the                            patient.                           - Resume previous diet.                           - Continue present medications.                           - Repeat colonoscopy in 10 years for screening                            purposes.                           - Return to primary care physician as previously                            scheduled.                           -  IDA likely from mennorhagia . Continue iron                             supplementation . Patient is planned for                            hystrectomy , if continues to be anemic despite                            controlling the menorrhagia, may recommend capsule                            endoscoy Procedure Code(s):        --- Professional ---                           [phone], Colonoscopy, flexible; diagnostic, including                            collection of specimen(s) by brushing or washing,                            when performed (separate procedure) Diagnosis Code(s):        --- Professional ---                           K64.8, Other hemorrhoids                           D50.0, Iron  deficiency anemia secondary to blood                            loss (chronic)                           D50.9, Iron  deficiency anemia, unspecified CPT copyright 2022 American Medical Association. All rights reserved. The codes documented in this report are preliminary and upon coder review may  be revised to meet current compliance requirements. Deatrice Dine, MD Deatrice Dine, MD 05/15/2024 2:09:50 PM This report has been signed electronically. Number of Addenda: 0

## 2024-05-15 NOTE — Anesthesia Procedure Notes (Signed)
 Date/Time: 05/15/2024 1:43 PM  Performed by: Para Jerelene CROME, CRNAOxygen Delivery Method: Nasal cannula

## 2024-05-16 ENCOUNTER — Encounter (INDEPENDENT_AMBULATORY_CARE_PROVIDER_SITE_OTHER): Payer: Self-pay | Admitting: *Deleted

## 2024-05-16 ENCOUNTER — Encounter (HOSPITAL_COMMUNITY): Payer: Self-pay | Admitting: Gastroenterology

## 2024-05-16 ENCOUNTER — Ambulatory Visit: Payer: Self-pay | Admitting: Gastroenterology

## 2024-05-16 NOTE — Anesthesia Postprocedure Evaluation (Signed)
 Anesthesia Post Note  Patient: Joann White  Procedure(s) Performed: COLONOSCOPY  Patient location during evaluation: Phase II Anesthesia Type: General Level of consciousness: awake Pain management: pain level controlled Vital Signs Assessment: post-procedure vital signs reviewed and stable Respiratory status: spontaneous breathing and respiratory function stable Cardiovascular status: blood pressure returned to baseline and stable Postop Assessment: no headache and no apparent nausea or vomiting Anesthetic complications: no Comments: Late entry   No notable events documented.   Last Vitals:  Vitals:   05/15/24 1250 05/15/24 1405  BP: 109/84 103/61  Pulse: 77 64  Resp: 12 17  Temp: 36.8 C 36.5 C  SpO2: 99% 100%    Last Pain:  Vitals:   05/15/24 1405  TempSrc: Oral  PainSc: 0-No pain                 Yvonna JINNY Bosworth

## 2024-05-22 ENCOUNTER — Other Ambulatory Visit: Payer: Self-pay | Admitting: Internal Medicine

## 2024-05-23 ENCOUNTER — Ambulatory Visit
Admission: RE | Admit: 2024-05-23 | Discharge: 2024-05-23 | Disposition: A | Discharge status: Home / Self Care | Source: Ambulatory Visit | Attending: Otolaryngology | Admitting: Otolaryngology

## 2024-05-23 DIAGNOSIS — J324 Chronic pansinusitis: Secondary | ICD-10-CM | POA: Diagnosis present

## 2024-05-24 ENCOUNTER — Ambulatory Visit: Attending: Nurse Practitioner | Admitting: Nurse Practitioner

## 2024-05-24 ENCOUNTER — Encounter: Payer: Self-pay | Admitting: Nurse Practitioner

## 2024-05-24 VITALS — BP 110/80 | HR 92 | Ht 66.0 in | Wt 190.0 lb

## 2024-05-24 DIAGNOSIS — R002 Palpitations: Secondary | ICD-10-CM

## 2024-05-24 DIAGNOSIS — I10 Essential (primary) hypertension: Secondary | ICD-10-CM

## 2024-05-24 DIAGNOSIS — R9431 Abnormal electrocardiogram [ECG] [EKG]: Secondary | ICD-10-CM

## 2024-05-24 DIAGNOSIS — Z01810 Encounter for preprocedural cardiovascular examination: Secondary | ICD-10-CM | POA: Diagnosis not present

## 2024-05-24 NOTE — Patient Instructions (Addendum)

## 2024-05-24 NOTE — Progress Notes (Unsigned)
 Cardiology Office Note   Date:  05/24/2024 ID:  CESIAH WESTLEY, DOB 01/09/1976, MRN [id number] PCP: Trudy Vaughn FALCON, MD  [facility] Providers Cardiologist:  Jayson Sierras, MD     History of Present Illness Joann White is a 48 y.o. female with a PMH of palpitations, HTN, Arnold-Chiari malformation, type 1, and anxiety, who presents today for pre-op clearance.   Last seen by Dr. Sierras on October 29, 2021. She noted palpitations at the time. Monitor was arranged  - see report below.   Today she presents for pre-op clearance. She is pending total laparoscopic hysterectomy, bilateral salpingectomy. She states she is doing well. Denies any acute cardiac complaints or issues. Denies any chest pain, shortness of breath, palpitations, syncope, presyncope, dizziness, orthopnea, PND, swelling or significant weight changes, acute bleeding, or claudication.  ROS: Negative. See HPI.   SH: Her husband is a patient of mine.   Studies Reviewed  EKG:  EKG Interpretation Date/Time:  Thursday May 24 2024 13:53:23 EDT Ventricular Rate:  92 PR Interval:  132 QRS Duration:  86 QT Interval:  386 QTC Calculation: 477 R Axis:   36  Text Interpretation: Normal sinus rhythm Nonspecific T wave abnormality Prolonged QT When compared with ECG of 25-Jan-2022 13:40, QT has lengthened Confirmed by Miriam Norris [phone]) on 05/25/2024 12:20:35 PM   Cardiac monitor 11/2021:  ZIO XT reviewed. 11 days, 13 hours analyzed. Predominant rhythm is sinus with heart rate ranging from 51 bpm up to 135 bpm and average heart rate 81 bpm. Rare PACs including couplets were noted representing less than 1% total beats. Rare PVCs were noted representing less than 1% total beats. Possible limited junctional tachycardia was noted. No atrial fibrillation. There were no pauses.  Physical Exam VS:  BP 110/80   Pulse 92   Ht 5' 6 (1.676 m)   Wt 190 lb (86.2 kg)   LMP 05/17/2024 (Exact Date)   SpO2 98%   BMI  30.67 kg/m        Wt Readings from Last 3 Encounters:  05/24/24 190 lb (86.2 kg)  05/15/24 190 lb (86.2 kg)  05/09/24 189 lb (85.7 kg)    GEN: Well nourished, well developed in no acute distress NECK: No JVD; No carotid bruits CARDIAC: S1/S2, RRR, no murmurs, rubs, gallops RESPIRATORY:  Clear to auscultation without rales, wheezing or rhonchi  ABDOMEN: Soft, non-tender, non-distended EXTREMITIES:  No edema; No deformity   ASSESSMENT AND PLAN  Pre-operative cardiovascular risk assessment} Joann White's perioperative risk of a major cardiac event is 0.4% according to the Revised Cardiac Risk Index (RCRI).  Therefore, she is at low risk for perioperative complications.   Her functional capacity is excellent at 6.27 METs according to the Duke Activity Status Index (DASI). Recommendations: According to ACC/AHA guidelines, no further cardiovascular testing needed.  The patient may proceed to surgery at acceptable risk.   Antiplatelet and/or Anticoagulation Recommendations: She is not on any ASA or antiplatelet medication that needs to be held prior to procedure.   2. Palpitations Denies any tachycardia or palpitations. HR is well controlled. Not on any AV nodal agents. Heart healthy diet and regular cardiovascular exercise encouraged.   3. HTN BP stable. Discussed to monitor BP at home at least 2 hours after medications and sitting for 5-10 minutes. No medication changes at this time. Heart healthy diet and regular cardiovascular exercise encouraged.   4. Prolonged QT She is on medications such as Zanaflex  that can cause this. Will route note  to provider who prescribed this regarding this. Continue to follow with PCP.   Dispo: Follow-up with MD/[facility] as needed.   Signed, Almarie Crate, NP

## 2024-05-28 ENCOUNTER — Inpatient Hospital Stay (HOSPITAL_COMMUNITY): Admit: 2024-05-28 | Admitting: Neurosurgery

## 2024-05-28 SURGERY — SUBOCCIPITAL CRANIECTOMY CERVICAL LAMINECTOMY/DURAPLASTY
Anesthesia: General

## 2024-05-30 ENCOUNTER — Ambulatory Visit: Admitting: Primary Care

## 2024-05-31 ENCOUNTER — Telehealth (INDEPENDENT_AMBULATORY_CARE_PROVIDER_SITE_OTHER): Payer: Self-pay | Admitting: Otolaryngology

## 2024-05-31 NOTE — Telephone Encounter (Signed)
 Per dr.Teoh, I called patient about her sinus CT. Her scan showed a slight left maxillary sinus disease. Dr. Karis will review her scan on 06/11/24.

## 2024-06-04 ENCOUNTER — Telehealth: Payer: Self-pay | Admitting: Neurology

## 2024-06-04 NOTE — Telephone Encounter (Signed)
 Can you let patient know that [facility] Peck(cardiology) contacted me and thought the Tizanidine  could be causing a prolongation of heart rhythm(long qt). I would stop the tizanidine . Other muscle relaxers may do the same so I would concentrate on heat and possibly topical voltaren  for her muscle aches. Also, please call the pharmacy and cancel the prescription. thanks

## 2024-06-05 NOTE — Telephone Encounter (Signed)
 I called pt to let her know about the communication that Dr. Ines received from Almarie Crate NP that tizanidine  (as well as other muscle relaxers) could be causing prolonged QT (heart arrhythmia) and recommend stopping the medication.  Using heat and voltaren  for her muscle aches.  She said it was for paresthesias in her face.  She said the the NP did not even listen to her heart. She was quite upset about this.  She felt like the doctor should have called her.  I explained to her again that the cardiology NP had the concern about the muscle relaxers causing heart rhythm irregularity (prolonged QT) and so Dr. Ines will stop the tizanidine  and to use heat and voltaren  for the paresthesias on her face.  She still was agitated/upset and she hung up after that.  I did not have a chance to relay that would be cancelling the prescription.  I did cancel, spoke to Marcus at [employer], and he deactivated the tizanidine .

## 2024-06-05 NOTE — Addendum Note (Signed)
 Addended by: NEYSA NENA RAMAN on: 06/05/2024 09:32 AM   Modules accepted: Orders

## 2024-06-07 ENCOUNTER — Ambulatory Visit: Admitting: Nurse Practitioner

## 2024-06-11 ENCOUNTER — Ambulatory Visit (INDEPENDENT_AMBULATORY_CARE_PROVIDER_SITE_OTHER): Admitting: Otolaryngology

## 2024-06-11 ENCOUNTER — Encounter (INDEPENDENT_AMBULATORY_CARE_PROVIDER_SITE_OTHER): Payer: Self-pay | Admitting: Otolaryngology

## 2024-06-11 VITALS — BP 126/60 | HR 90

## 2024-06-11 DIAGNOSIS — J3489 Other specified disorders of nose and nasal sinuses: Secondary | ICD-10-CM

## 2024-06-11 DIAGNOSIS — J343 Hypertrophy of nasal turbinates: Secondary | ICD-10-CM | POA: Diagnosis not present

## 2024-06-11 DIAGNOSIS — J320 Chronic maxillary sinusitis: Secondary | ICD-10-CM

## 2024-06-12 DIAGNOSIS — J320 Chronic maxillary sinusitis: Secondary | ICD-10-CM | POA: Insufficient documentation

## 2024-06-12 DIAGNOSIS — J3489 Other specified disorders of nose and nasal sinuses: Secondary | ICD-10-CM | POA: Insufficient documentation

## 2024-06-12 NOTE — Progress Notes (Signed)
 Patient ID: Joann White, female   DOB: [birth year], 48 y.o.   MRN: [id number]  Follow-up: Chronic sinusitis, chronic nasal obstruction  HPI: The patient is a 48 year old female who returns today for follow-up evaluation.  She was last seen in July 2025.  At that time, she was complaining of chronic sinusitis, recurrent facial pain, and chronic nasal obstruction.  She has been symptomatic since 2017.  She has noted frequent foul-smelling odor from her nose.  She was previously treated with multiple courses of antibiotics, nasal saline irrigation, steroid nasal sprays, and OTC allergy medications without improvement in her symptoms.  Her recent sinus CT scan showed chronic left maxillary sinusitis, bilateral inferior turbinate hypertrophy, and right concha bullosa.  The patient returns today complaining of persistent nasal obstruction and chronic left facial pain.  She is interested in more definitive treatment.  Exam: General: Communicates without difficulty, well nourished, no acute distress. Head: Normocephalic, no evidence injury, no tenderness, facial buttresses intact without stepoff. Face/sinus: No tenderness to palpation and percussion. Facial movement is normal and symmetric. Eyes: PERRL, EOMI. No scleral icterus, conjunctivae clear. Neuro: CN II exam reveals vision grossly intact.  No nystagmus at any point of gaze. Ears: Auricles well formed without lesions.  Ear canals are intact without mass or lesion.  No erythema or edema is appreciated.  The TMs are intact without fluid. Nose: External evaluation reveals normal support and skin without lesions.  Dorsum is intact.  Anterior rhinoscopy reveals congested mucosa over anterior aspect of inferior turbinates and intact septum. Oral:  Oral cavity and oropharynx are intact, symmetric, without erythema or edema.  Mucosa is moist without lesions. Neck: Full range of motion without pain.  There is no significant lymphadenopathy.  No masses palpable.   Thyroid  bed within normal limits to palpation.  Parotid glands and submandibular glands equal bilaterally without mass.  Trachea is midline. Neuro:  CN 2-12 grossly intact.   Assessment: 1.  Chronic left maxillary sinusitis, resulting in chronic facial pain and foul-smelling odor in her nasal cavity.  The patient has not responded to treatment with multiple antibiotics, nasal saline irrigation, steroid nasal sprays, and OTC allergy medications. 2.  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  Plan: 1.  The physical exam findings and the CT images are reviewed with the patient. 2.  Continue with Flonase  nasal spray and nasal saline irrigation. 3.  Based on the above findings, the patient will benefit from surgical intervention with left maxillary antrostomy, bilateral inferior turbinate reduction, and right concha bullosa resection.  The risk, benefits, alternatives, and details of the procedures are extensively discussed.  Questions were invited and answered. 4.  The patient would like to proceed with the procedures.  We will schedule the procedures in accordance with the patient's schedule.

## 2024-06-25 ENCOUNTER — Other Ambulatory Visit: Payer: Self-pay

## 2024-06-25 ENCOUNTER — Encounter (HOSPITAL_BASED_OUTPATIENT_CLINIC_OR_DEPARTMENT_OTHER): Payer: Self-pay | Admitting: Otolaryngology

## 2024-06-25 NOTE — Progress Notes (Signed)
   06/25/24 1343  PAT Phone Screen  Is the patient taking a GLP-1 receptor agonist? No  Do You Have Diabetes? No  Do You Have Hypertension? Yes  Have You Ever Been to the ER for Asthma? No  Have You Taken Oral Steroids in the Past 3 Months? No  Do you Take Phenteramine or any Other Diet Drugs? No  Recent  Lab Work, EKG, CXR? Yes  Where was this test performed? EKG 05/24/24  Do you have a history of heart problems? No  Cardiologist Name 05/24/24 consult w/ FORBES Crate NP for cards clearance for scheduled hysterectomy req by ob/gyn  Any Recent Hospitalizations? No  Height 5' 6 (1.676 m)  Weight 86.2 kg  Pat Appointment Scheduled Yes ([facility])

## 2024-06-26 ENCOUNTER — Encounter (HOSPITAL_BASED_OUTPATIENT_CLINIC_OR_DEPARTMENT_OTHER)
Admission: RE | Admit: 2024-06-26 | Discharge: 2024-06-26 | Disposition: A | Discharge status: Home / Self Care | Source: Ambulatory Visit | Attending: Otolaryngology | Admitting: Otolaryngology

## 2024-06-26 DIAGNOSIS — Z79899 Other long term (current) drug therapy: Secondary | ICD-10-CM | POA: Diagnosis not present

## 2024-06-26 DIAGNOSIS — Z01812 Encounter for preprocedural laboratory examination: Secondary | ICD-10-CM | POA: Insufficient documentation

## 2024-06-26 LAB — BASIC METABOLIC PANEL WITH GFR
Anion gap: 6 (ref 5–15)
BUN: 7 mg/dL (ref 6–20)
CO2: 22 mmol/L (ref 22–32)
Calcium: 9.4 mg/dL (ref 8.9–10.3)
Chloride: 110 mmol/L (ref 98–111)
Creatinine, Ser: 0.9 mg/dL (ref 0.44–1.00)
GFR, Estimated: >60 mL/min (ref >60)
Glucose, Bld: 97 mg/dL (ref 70–99)
Potassium: 3.9 mmol/L (ref 3.5–5.1)
Sodium: 138 mmol/L (ref 135–145)

## 2024-06-28 ENCOUNTER — Encounter: Payer: Self-pay | Admitting: Nurse Practitioner

## 2024-07-02 ENCOUNTER — Encounter (HOSPITAL_BASED_OUTPATIENT_CLINIC_OR_DEPARTMENT_OTHER): Payer: Self-pay | Admitting: Otolaryngology

## 2024-07-02 ENCOUNTER — Ambulatory Visit (HOSPITAL_BASED_OUTPATIENT_CLINIC_OR_DEPARTMENT_OTHER): Admitting: Certified Registered Nurse Anesthetist

## 2024-07-02 ENCOUNTER — Encounter (HOSPITAL_BASED_OUTPATIENT_CLINIC_OR_DEPARTMENT_OTHER)
Admission: RE | Disposition: A | Discharge status: Home / Self Care | Payer: Self-pay | Source: Home / Self Care | Attending: Otolaryngology

## 2024-07-02 ENCOUNTER — Ambulatory Visit (HOSPITAL_BASED_OUTPATIENT_CLINIC_OR_DEPARTMENT_OTHER)
Admission: RE | Admit: 2024-07-02 | Discharge: 2024-07-02 | Disposition: A | Discharge status: Home / Self Care | Attending: Otolaryngology | Admitting: Otolaryngology

## 2024-07-02 ENCOUNTER — Other Ambulatory Visit: Payer: Self-pay

## 2024-07-02 DIAGNOSIS — F419 Anxiety disorder, unspecified: Secondary | ICD-10-CM | POA: Diagnosis not present

## 2024-07-02 DIAGNOSIS — Z79899 Other long term (current) drug therapy: Secondary | ICD-10-CM | POA: Insufficient documentation

## 2024-07-02 DIAGNOSIS — F32A Depression, unspecified: Secondary | ICD-10-CM | POA: Insufficient documentation

## 2024-07-02 DIAGNOSIS — J338 Other polyp of sinus: Secondary | ICD-10-CM | POA: Diagnosis not present

## 2024-07-02 DIAGNOSIS — J3489 Other specified disorders of nose and nasal sinuses: Secondary | ICD-10-CM | POA: Insufficient documentation

## 2024-07-02 DIAGNOSIS — J320 Chronic maxillary sinusitis: Secondary | ICD-10-CM | POA: Insufficient documentation

## 2024-07-02 DIAGNOSIS — G8929 Other chronic pain: Secondary | ICD-10-CM | POA: Diagnosis not present

## 2024-07-02 DIAGNOSIS — G4730 Sleep apnea, unspecified: Secondary | ICD-10-CM | POA: Diagnosis not present

## 2024-07-02 DIAGNOSIS — J343 Hypertrophy of nasal turbinates: Secondary | ICD-10-CM | POA: Insufficient documentation

## 2024-07-02 DIAGNOSIS — E039 Hypothyroidism, unspecified: Secondary | ICD-10-CM | POA: Insufficient documentation

## 2024-07-02 DIAGNOSIS — E119 Type 2 diabetes mellitus without complications: Secondary | ICD-10-CM | POA: Insufficient documentation

## 2024-07-02 DIAGNOSIS — Z87891 Personal history of nicotine dependence: Secondary | ICD-10-CM | POA: Insufficient documentation

## 2024-07-02 DIAGNOSIS — I10 Essential (primary) hypertension: Secondary | ICD-10-CM | POA: Diagnosis not present

## 2024-07-02 DIAGNOSIS — J449 Chronic obstructive pulmonary disease, unspecified: Secondary | ICD-10-CM | POA: Diagnosis not present

## 2024-07-02 DIAGNOSIS — Z01818 Encounter for other preprocedural examination: Secondary | ICD-10-CM

## 2024-07-02 HISTORY — PX: TURBINATE REDUCTION: SHX6157

## 2024-07-02 HISTORY — PX: ENDOSCOPIC CONCHA BULLOSA RESECTION: SHX6395

## 2024-07-02 HISTORY — PX: MAXILLARY ANTROSTOMY: SHX2003

## 2024-07-02 LAB — POCT PREGNANCY, URINE: Preg Test, Ur: NEGATIVE

## 2024-07-02 SURGERY — REDUCTION, NASAL TURBINATE
Anesthesia: General | Laterality: Right

## 2024-07-02 MED ORDER — ACETAMINOPHEN 500 MG PO TABS
ORAL_TABLET | ORAL | Status: AC
Start: 2024-07-02 — End: 2024-07-02
  Filled 2024-07-02: qty 2

## 2024-07-02 MED ORDER — ONDANSETRON HCL 4 MG/2ML IJ SOLN
INTRAMUSCULAR | Status: AC
  Filled 2024-07-02: qty 2

## 2024-07-02 MED ORDER — CEFAZOLIN SODIUM-DEXTROSE 2-3 GM-%(50ML) IV SOLR
INTRAVENOUS | Status: DC | PRN
  Administered 2024-07-02: 2 g via INTRAVENOUS

## 2024-07-02 MED ORDER — MIDAZOLAM HCL 2 MG/2ML IJ SOLN
INTRAMUSCULAR | Status: AC
  Filled 2024-07-02: qty 2

## 2024-07-02 MED ORDER — OXYCODONE HCL 5 MG PO TABS
5.0000 mg | ORAL_TABLET | Freq: Once | ORAL | Status: AC | PRN
  Administered 2024-07-02: 5 mg via ORAL

## 2024-07-02 MED ORDER — SUGAMMADEX SODIUM 200 MG/2ML IV SOLN
INTRAVENOUS | Status: DC | PRN
  Administered 2024-07-02: 200 mg via INTRAVENOUS

## 2024-07-02 MED ORDER — MUPIROCIN 2 % EX OINT
TOPICAL_OINTMENT | CUTANEOUS | Status: DC | PRN
  Administered 2024-07-02: 1 via NASAL

## 2024-07-02 MED ORDER — DEXAMETHASONE SODIUM PHOSPHATE 4 MG/ML IJ SOLN
INTRAMUSCULAR | Status: DC | PRN
  Administered 2024-07-02: 10 mg via INTRAVENOUS

## 2024-07-02 MED ORDER — FENTANYL CITRATE (PF) 100 MCG/2ML IJ SOLN
INTRAMUSCULAR | Status: AC
Start: 2024-07-02 — End: 2024-07-02
  Filled 2024-07-02: qty 2

## 2024-07-02 MED ORDER — SODIUM CHLORIDE 0.9 % IV SOLN
INTRAVENOUS | Status: AC | PRN
  Administered 2024-07-02: 100 mL via INTRAMUSCULAR

## 2024-07-02 MED ORDER — DEXAMETHASONE SODIUM PHOSPHATE 10 MG/ML IJ SOLN
INTRAMUSCULAR | Status: AC
  Filled 2024-07-02: qty 1

## 2024-07-02 MED ORDER — ROCURONIUM BROMIDE 100 MG/10ML IV SOLN
INTRAVENOUS | Status: DC | PRN
  Administered 2024-07-02: 60 mg via INTRAVENOUS

## 2024-07-02 MED ORDER — LACTATED RINGERS IV SOLN: INTRAVENOUS | Status: DC

## 2024-07-02 MED ORDER — OXYCODONE HCL 5 MG/5ML PO SOLN: 5.0000 mg | Freq: Once | ORAL | Status: AC | PRN

## 2024-07-02 MED ORDER — LIDOCAINE HCL (CARDIAC) PF 100 MG/5ML IV SOSY
PREFILLED_SYRINGE | INTRAVENOUS | Status: DC | PRN
Start: 2024-07-02 — End: 2024-07-02
  Administered 2024-07-02: 100 mg via INTRAVENOUS

## 2024-07-02 MED ORDER — LIDOCAINE 2% (20 MG/ML) 5 ML SYRINGE
INTRAMUSCULAR | Status: AC
Start: 2024-07-02 — End: 2024-07-02
  Filled 2024-07-02: qty 5

## 2024-07-02 MED ORDER — FENTANYL CITRATE (PF) 100 MCG/2ML IJ SOLN
INTRAMUSCULAR | Status: DC | PRN
  Administered 2024-07-02 (×2): 50 ug via INTRAVENOUS

## 2024-07-02 MED ORDER — PROPOFOL 10 MG/ML IV BOLUS
INTRAVENOUS | Status: AC
  Filled 2024-07-02: qty 20

## 2024-07-02 MED ORDER — ACETAMINOPHEN 500 MG PO TABS
1000.0000 mg | ORAL_TABLET | Freq: Once | ORAL | Status: AC
  Administered 2024-07-02: 1000 mg via ORAL

## 2024-07-02 MED ORDER — ONDANSETRON HCL 4 MG/2ML IJ SOLN: 4.0000 mg | Freq: Once | INTRAMUSCULAR | Status: DC | PRN

## 2024-07-02 MED ORDER — 0.9 % SODIUM CHLORIDE (POUR BTL) OPTIME
TOPICAL | Status: DC | PRN
  Administered 2024-07-02: 1000 mL

## 2024-07-02 MED ORDER — MIDAZOLAM HCL 5 MG/5ML IJ SOLN
INTRAMUSCULAR | Status: DC | PRN
  Administered 2024-07-02: 2 mg via INTRAVENOUS

## 2024-07-02 MED ORDER — OXYCODONE HCL 5 MG PO TABS
ORAL_TABLET | ORAL | Status: AC
  Filled 2024-07-02: qty 1

## 2024-07-02 MED ORDER — ONDANSETRON HCL 4 MG/2ML IJ SOLN
INTRAMUSCULAR | Status: DC | PRN
  Administered 2024-07-02: 4 mg via INTRAVENOUS

## 2024-07-02 MED ORDER — ACETAMINOPHEN 10 MG/ML IV SOLN: 1000.0000 mg | Freq: Once | INTRAVENOUS | Status: DC | PRN

## 2024-07-02 MED ORDER — PROPOFOL 10 MG/ML IV BOLUS
INTRAVENOUS | Status: DC | PRN
  Administered 2024-07-02: 200 mg via INTRAVENOUS

## 2024-07-02 MED ORDER — FENTANYL CITRATE (PF) 100 MCG/2ML IJ SOLN
INTRAMUSCULAR | Status: AC
  Filled 2024-07-02: qty 2

## 2024-07-02 MED ORDER — FENTANYL CITRATE (PF) 100 MCG/2ML IJ SOLN
25.0000 ug | INTRAMUSCULAR | Status: DC | PRN
  Administered 2024-07-02 (×2): 25 ug via INTRAVENOUS

## 2024-07-02 MED ORDER — OXYMETAZOLINE HCL 0.05 % NA SOLN
NASAL | Status: DC | PRN
  Administered 2024-07-02: 1 via TOPICAL

## 2024-07-02 MED ORDER — ROCURONIUM BROMIDE 10 MG/ML (PF) SYRINGE
PREFILLED_SYRINGE | INTRAVENOUS | Status: AC
  Filled 2024-07-02: qty 10

## 2024-07-02 SURGICAL SUPPLY — 39 items
BLADE RAD40 ROTATE 4M 4 5PK (BLADE) IMPLANT
BLADE RAD60 ROTATE M4 4 5PK (BLADE) IMPLANT
BLADE TRICUT ROTATE M4 4 5PK (BLADE) IMPLANT
CANISTER SUC SOCK COL 7IN (MISCELLANEOUS) ×3 IMPLANT
CANISTER SUCT 1200ML W/VALVE (MISCELLANEOUS) ×6 IMPLANT
COAGULATOR SUCT 8FR VV (MISCELLANEOUS) IMPLANT
DEFOGGER MIRROR 1QT (MISCELLANEOUS) ×6 IMPLANT
DRSG NASOPORE 8CM (GAUZE/BANDAGES/DRESSINGS) IMPLANT
DRSG TELFA 3X8 NADH STRL (GAUZE/BANDAGES/DRESSINGS) IMPLANT
ELECTRODE REM PT RTRN 9FT ADLT (ELECTROSURGICAL) ×3 IMPLANT
GAUZE SPONGE 2X2 STRL 8-PLY (GAUZE/BANDAGES/DRESSINGS) ×3 IMPLANT
GLOVE BIO SURGEON STRL SZ7.5 (GLOVE) ×3 IMPLANT
GOWN STRL REUS W/ TWL LRG LVL3 (GOWN DISPOSABLE) ×6 IMPLANT
HEMOSTAT SURGICEL 2X14 (HEMOSTASIS) IMPLANT
IV NS 250ML BAXH (IV SOLUTION) IMPLANT
IV NS 500ML BAXH (IV SOLUTION) ×3 IMPLANT
IV SET EXT 30 76VOL 4 MALE LL (IV SETS) IMPLANT
NDL HYPO 25X1 1.5 SAFETY (NEEDLE) ×3 IMPLANT
NDL SPNL 25GX3.5 QUINCKE BL (NEEDLE) IMPLANT
NEEDLE HYPO 25X1 1.5 SAFETY (NEEDLE) ×3 IMPLANT
NEEDLE SPNL 25GX3.5 QUINCKE BL (NEEDLE) ×3 IMPLANT
NS IRRIG 1000ML POUR BTL (IV SOLUTION) ×3 IMPLANT
PACK BASIN DAY SURGERY FS (CUSTOM PROCEDURE TRAY) ×3 IMPLANT
PACK ENT DAY SURGERY (CUSTOM PROCEDURE TRAY) ×3 IMPLANT
PAD MAGNETIC INSTR ST 16X20 (MISCELLANEOUS) IMPLANT
PATTIES SURGICAL .5 X3 (DISPOSABLE) IMPLANT
SLEEVE SCD COMPRESS KNEE MED (STOCKING) ×3 IMPLANT
SPIKE FLUID TRANSFER (MISCELLANEOUS) IMPLANT
SPLINT NASAL AIRWAY SILICONE (MISCELLANEOUS) IMPLANT
SPONGE NEURO XRAY DETECT 1X3 (DISPOSABLE) ×3 IMPLANT
SUCTION TUBE FRAZIER 10FR DISP (SUCTIONS) IMPLANT
SUT CHROMIC 4 0 P 3 18 (SUTURE) IMPLANT
SUT PLAIN 4 0 ~~LOC~~ 1 (SUTURE) IMPLANT
SUT PROLENE 3 0 PS 2 (SUTURE) IMPLANT
SYR 50ML LL SCALE MARK (SYRINGE) IMPLANT
TOWEL GREEN STERILE FF (TOWEL DISPOSABLE) ×3 IMPLANT
TUBE CONNECTING 20X1/4 (TUBING) ×3 IMPLANT
TUBE SALEM SUMP 16F (TUBING) ×3 IMPLANT
YANKAUER SUCT BULB TIP NO VENT (SUCTIONS) ×3 IMPLANT

## 2024-07-02 NOTE — Transfer of Care (Signed)
 Immediate Anesthesia Transfer of Care Note  Patient: Joann White  Procedure(s) Performed: REDUCTION, NASAL TURBINATE (Bilateral) EXCISION, CONCHA BULLOSA, ENDOSCOPIC (Right) MAXILLARY ANTROSTOMY (Left)  Patient Location: PACU  Anesthesia Type:General  Level of Consciousness: awake, alert , and oriented  Airway & Oxygen Therapy: Patient Spontanous Breathing and Patient connected to face mask oxygen  Post-op Assessment: Report given to RN and Post -op Vital signs reviewed and stable  Post vital signs: Reviewed and stable  Last Vitals:  Vitals Value Taken Time  BP 137/95 07/02/24 09:46  Temp    Pulse 76 07/02/24 09:48  Resp 16 07/02/24 09:48  SpO2 98 % 07/02/24 09:48  Vitals shown include unfiled device data.  Last Pain:  Vitals:   07/02/24 0658  TempSrc: Axillary  PainSc: 0-No pain         Complications: No notable events documented.

## 2024-07-02 NOTE — H&P (Signed)
 Cc: Chronic sinusitis, chronic nasal obstruction   HPI: The patient is a 48 year old female who returns today for follow-up evaluation.  She was last seen in July 2025.  At that time, she was complaining of chronic sinusitis, recurrent facial pain, and chronic nasal obstruction.  She has been symptomatic since 2017.  She has noted frequent foul-smelling odor from her nose.  She was previously treated with multiple courses of antibiotics, nasal saline irrigation, steroid nasal sprays, and OTC allergy medications without improvement in her symptoms.  Her recent sinus CT scan showed chronic left maxillary sinusitis, bilateral inferior turbinate hypertrophy, and right concha bullosa.  The patient returns today complaining of persistent nasal obstruction and chronic left facial pain.  She is interested in more definitive treatment.   Exam: General: Communicates without difficulty, well nourished, no acute distress. Head: Normocephalic, no evidence injury, no tenderness, facial buttresses intact without stepoff. Face/sinus: No tenderness to palpation and percussion. Facial movement is normal and symmetric. Eyes: PERRL, EOMI. No scleral icterus, conjunctivae clear. Neuro: CN II exam reveals vision grossly intact.  No nystagmus at any point of gaze. Ears: Auricles well formed without lesions.  Ear canals are intact without mass or lesion.  No erythema or edema is appreciated.  The TMs are intact without fluid. Nose: External evaluation reveals normal support and skin without lesions.  Dorsum is intact.  Anterior rhinoscopy reveals congested mucosa over anterior aspect of inferior turbinates and intact septum. Oral:  Oral cavity and oropharynx are intact, symmetric, without erythema or edema.  Mucosa is moist without lesions. Neck: Full range of motion without pain.  There is no significant lymphadenopathy.  No masses palpable.  Thyroid  bed within normal limits to palpation.  Parotid glands and submandibular glands  equal bilaterally without mass.  Trachea is midline. Neuro:  CN 2-12 grossly intact.    Assessment: 1.  Chronic left maxillary sinusitis, resulting in chronic facial pain and foul-smelling odor in her nasal cavity.  The patient has not responded to treatment with multiple antibiotics, nasal saline irrigation, steroid nasal sprays, and OTC allergy medications. 2.  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   Plan: 1.  The physical exam findings and the CT images are reviewed with the patient. 2.  Continue with Flonase  nasal spray and nasal saline irrigation. 3.  Based on the above findings, the patient will benefit from surgical intervention with left maxillary antrostomy, bilateral inferior turbinate reduction, and right concha bullosa resection.  The risk, benefits, alternatives, and details of the procedures are extensively discussed.  Questions were invited and answered. 4.  The patient would like to proceed with the procedures.

## 2024-07-02 NOTE — Anesthesia Procedure Notes (Signed)
 Procedure Name: Intubation Date/Time: 07/02/2024 8:53 AM  Performed by: Buster Catheryn SAUNDERS, CRNAPre-anesthesia Checklist: Patient identified, Emergency Drugs available, Suction available and Patient being monitored Patient Re-evaluated:Patient Re-evaluated prior to induction Oxygen Delivery Method: Circle system utilized Preoxygenation: Pre-oxygenation with 100% oxygen Induction Type: IV induction Ventilation: Mask ventilation without difficulty Laryngoscope Size: Miller and 2 Tube type: Oral Tube size: 7.0 mm Number of attempts: 1 Airway Equipment and Method: Stylet Placement Confirmation: ETT inserted through vocal cords under direct vision, positive ETCO2 and breath sounds checked- equal and bilateral Secured at: 24 cm Tube secured with: Tape Dental Injury: Teeth and Oropharynx as per pre-operative assessment

## 2024-07-02 NOTE — Anesthesia Preprocedure Evaluation (Signed)
 Anesthesia Evaluation  Patient identified by MRN, date of birth, ID band Patient awake    Reviewed: Allergy & Precautions, NPO status , Patient's Chart, lab work & pertinent test results  History of Anesthesia Complications Negative for: history of anesthetic complications  Airway Mallampati: III  TM Distance: >3 FB Neck ROM: Full    Dental no notable dental hx. (+) Teeth Intact   Pulmonary sleep apnea , COPD, Patient abstained from smoking.Not current smoker, former smoker   Pulmonary exam normal breath sounds clear to auscultation       Cardiovascular Exercise Tolerance: Good METShypertension, Pt. on medications (-) CAD and (-) Past MI (-) dysrhythmias  Rhythm:Regular Rate:Normal - Systolic murmurs    Neuro/Psych  Headaches PSYCHIATRIC DISORDERS Anxiety Depression       GI/Hepatic ,neg GERD  ,,(+)     (-) substance abuse    Endo/Other  neg diabetesHypothyroidism    Renal/GU negative Renal ROS     Musculoskeletal   Abdominal   Peds  Hematology   Anesthesia Other Findings Past Medical History: No date: Allergic rhinitis No date: Anemia     Comment:  blood transfusion on 04/09/2020 2022: Anxiety No date: Arnold-Chiari malformation, type I (HCC) 11/12/2017: Bell's palsy No date: Chickenpox No date: Chronic daily headache No date: COPD (chronic obstructive pulmonary disease) (HCC)     Comment:  mild 2022: Depression No date: Essential hypertension 04/09/2020: History of blood transfusion 2020: Low back pain     Comment:  low back pain 2022: Menorrhagia 10/29/2021: Palpitations     Comment:  Cardiologist Dr. Jayson Concha, LOV 10/29/21. Patient               wore a long term heart monitor that demonstrated rare               PACs and PVCs and a limited episode of junctional               tachycardia. No treatment required unless palpitations               were bothersome. Patient prescribed Metoprol 25 mg  24               hour 1/2 tablet daily. 11/2021: Paresthesia     Comment:  Patient saw neurologist, Dr. Tonita Blanch on 12/04/21               (see MD  note in Epic) for parathesia and whole body               pain. On 01/05/22 the patient had a NCW w/ EMG that was               normal. 2020: Sciatica     Comment:  chronic low back pain with sciatica No date: Sickle cell trait (HCC) No date: Sleep apnea No date: Uterine leiomyoma  Reproductive/Obstetrics                              Anesthesia Physical Anesthesia Plan  ASA: 2  Anesthesia Plan: General   Post-op Pain Management: Tylenol  PO (pre-op)* and Toradol  IV (intra-op)*   Induction: Intravenous  PONV Risk Score and Plan: 4 or greater and Ondansetron , Dexamethasone  and Midazolam   Airway Management Planned: Oral ETT  Additional Equipment: None  Intra-op Plan:   Post-operative Plan: Extubation in OR  Informed Consent: I have reviewed the patients History and Physical, chart, labs and discussed the  procedure including the risks, benefits and alternatives for the proposed anesthesia with the patient or authorized representative who has indicated his/her understanding and acceptance.     Dental advisory given  Plan Discussed with: CRNA and Surgeon  Anesthesia Plan Comments: (Discussed risks of anesthesia with patient, including PONV, sore throat, lip/dental/eye damage. Rare risks discussed as well, such as cardiorespiratory and neurological sequelae, and allergic reactions. Discussed the role of CRNA in patient's perioperative care. Patient understands.)        Anesthesia Quick Evaluation

## 2024-07-02 NOTE — Anesthesia Postprocedure Evaluation (Signed)
 Anesthesia Post Note  Patient: Joann White  Procedure(s) Performed: REDUCTION, NASAL TURBINATE (Bilateral) EXCISION, CONCHA BULLOSA, ENDOSCOPIC (Right) MAXILLARY ANTROSTOMY (Left)     Patient location during evaluation: PACU Anesthesia Type: General Level of consciousness: awake and alert Pain management: pain level controlled Vital Signs Assessment: post-procedure vital signs reviewed and stable Respiratory status: spontaneous breathing, nonlabored ventilation, respiratory function stable and patient connected to nasal cannula oxygen Cardiovascular status: blood pressure returned to baseline and stable Postop Assessment: no apparent nausea or vomiting Anesthetic complications: no   No notable events documented.  Last Vitals:  Vitals:   07/02/24 1010 07/02/24 1024  BP:  (!) 147/101  Pulse: 70 68  Resp: (!) 21   Temp: (!) 36.3 C (!) 36.3 C  SpO2: 97% 98%    Last Pain:  Vitals:   07/02/24 1024  TempSrc: Temporal  PainSc: 8                  Rome Ade

## 2024-07-02 NOTE — Discharge Instructions (Addendum)
 POSTOPERATIVE INSTRUCTIONS FOR PATIENTS HAVING NASAL OR SINUS OPERATIONS ACTIVITY: Restrict activity at home for the first two days, resting as much as possible. Light activity is best. You may usually return to work within a week. You should refrain from nose blowing, strenuous activity, or heavy lifting greater than 20lbs for a total of one week after your operation.  If sneezing cannot be avoided, sneeze with your mouth open. DISCOMFORT: You may experience a dull headache and pressure along with nasal congestion and discharge. These symptoms may be worse during the first week after the operation but may last as long as two to four weeks.  Please take Tylenol  or the pain medication that has been prescribed for you. Do not take aspirin or aspirin containing medications since they may cause bleeding.  You may experience symptoms of post nasal drainage, nasal congestion, headaches and fatigue for two or three months after your operation.  BLEEDING: You may have some blood tinged nasal drainage for approximately two weeks after the operation.  The discharge will be worse for the first week.  Please call our office at [phone] or go to the nearest hospital emergency room if you experience any of the following: heavy, bright red blood from your nose or mouth that lasts longer than 15 minutes or coughing up or vomiting bright red blood or blood clots. GENERAL CONSIDERATIONS: A gauze dressing will be placed on your upper lip to absorb any drainage after the operation. You may need to change this several times a day.  If you do not have very much drainage, you may remove the dressing.  Remember that you may gently wipe your nose with a tissue and sniff in, but DO NOT blow your nose. Please keep all of your postoperative appointments.  Your final results after the operation will depend on proper follow-up.  The initial visit is usually 2 to 5 days after the operation.  During this visit, the remaining nasal  packing and internal septal splints will be removed.  Your nasal and sinus cavities will be cleaned.  During the second visit, your nasal and sinus cavities will be cleaned again. Have someone drive you to your first two postoperative appointments.  How you care for your nose after the operation will influence the results that you obtain.  You should follow all directions, take your medication as prescribed, and call our office [phone] with any problems or questions. You may be more comfortable sleeping with your head elevated on two pillows. Do not take any medications that we have not prescribed or recommended. WARNING SIGNS: if any of the following should occur, please call our office: Persistent fever greater than 102F. Persistent vomiting. Severe and constant pain that is not relieved by prescribed pain medication. Trauma to the nose. Rash or unusual side effects from any medicines.    Post Anesthesia Home Care Instructions  Activity: Get plenty of rest for the remainder of the day. A responsible individual must stay with you for 24 hours following the procedure.  For the next 24 hours, DO NOT: -Drive a car -Advertising copywriter -Drink alcoholic beverages -Take any medication unless instructed by your physician -Make any legal decisions or sign important papers.  Meals: Start with liquid foods such as gelatin or soup. Progress to regular foods as tolerated. Avoid greasy, spicy, heavy foods. If nausea and/or vomiting occur, drink only clear liquids until the nausea and/or vomiting subsides. Call your physician if vomiting continues.  Special Instructions/Symptoms: Your throat may feel dry  or sore from the anesthesia or the breathing tube placed in your throat during surgery. If this causes discomfort, gargle with warm salt water. The discomfort should disappear within 24 hours.  Next dose of Tylenol  can be taken at 130pm today.

## 2024-07-02 NOTE — Op Note (Signed)
 DATE OF PROCEDURE: 07/02/2024  OPERATIVE REPORT   SURGEON: Daniel Moccasin, MD   PREOPERATIVE DIAGNOSES:  1.  Chronic left maxillary sinusitis and polyposis 2.  Bilateral inferior turbinate hypertrophy.  3.  Right concha bullosa  POSTOPERATIVE DIAGNOSES:  1.  Chronic left maxillary sinusitis and polyposis 2.  Bilateral inferior turbinate hypertrophy.  3.  Right concha bullosa  PROCEDURE PERFORMED:  1.  Left endoscopic maxillary antrostomy with polyp removal 2.  Bilateral partial inferior turbinate resection. 3.  Right endoscopic concha bullosa resection.  ANESTHESIA: General endotracheal tube anesthesia.   COMPLICATIONS: None.   ESTIMATED BLOOD LOSS: 150 mL.   INDICATION FOR PROCEDURE: [name] is a 48 y.o. female with a history of chronic sinusitis, recurrent facial pain, and chronic nasal obstruction. She has been symptomatic since 2017. She has noted frequent foul-smelling odor from her nose. She was previously treated with multiple courses of antibiotics, nasal saline irrigation, steroid nasal sprays, and OTC allergy medications without improvement in her symptoms. Her recent sinus CT scan showed chronic left maxillary sinusitis, bilateral inferior turbinate hypertrophy, and right concha bullosa. Based on the above findings, the decision was made for the patient to undergo the above-stated procedures. The risks, benefits, alternatives, and details of the procedures were discussed with the patient. Questions were invited and answered. Informed consent was obtained.   DESCRIPTION OF PROCEDURE: The patient was taken to the operating room and placed supine on the operating table. General endotracheal tube anesthesia was administered by the anesthesiologist. The patient was positioned, and prepped and draped in the standard fashion for nasal surgery. Pledgets soaked with Afrin were placed in both nasal cavities for decongestion. The pledgets were subsequently removed.    The inferior one  half of both hypertrophied inferior turbinate was crossclamped with a Kelly clamp. The inferior one half of each inferior turbinate was then resected with a pair of cross cutting scissors. Hemostasis was achieved with a suction cautery device.   Using a 0 endoscope, the right nasal cavity was examined. A concha bullosa was noted. Using Tru-Cut forceps, the inferior one third and medial one half of the concha bullosa was resected.  Attention was then focused on the left nasal cavity.  Polypoid tissue was noted within the middle meatus. The polypoid tissue was removed using a combination of microdebrider and Blakesley forceps. The uncinate process was resected with a freer elevator. The maxillary antrum was entered and enlarged using a combination of backbiter and microdebrider. Polypoid tissue was removed from the left maxillary sinus.  The left maxillary sinus was copiously irrigated with saline solution. Doyle splints were applied to the nasal septum.  The care of the patient was turned over to the anesthesiologist. The patient was awakened from anesthesia without difficulty. The patient was extubated and transferred to the recovery room in good condition.   OPERATIVE FINDINGS: Bilateral inferior turbinate hypertrophy.  Left chronic maxillary sinusitis and polyposis.  Right concha bullosa  SPECIMEN: Left sinus contents.   FOLLOWUP CARE: The patient be discharged home once she she is awake and alert. The patient will follow up in my office in 3 days for splint removal.   Joann Leanos Dois Moccasin, MD

## 2024-07-03 ENCOUNTER — Encounter (HOSPITAL_BASED_OUTPATIENT_CLINIC_OR_DEPARTMENT_OTHER): Payer: Self-pay | Admitting: Otolaryngology

## 2024-07-03 ENCOUNTER — Ambulatory Visit: Admitting: Gastroenterology

## 2024-07-03 ENCOUNTER — Other Ambulatory Visit (INDEPENDENT_AMBULATORY_CARE_PROVIDER_SITE_OTHER): Payer: Self-pay | Admitting: Otolaryngology

## 2024-07-03 ENCOUNTER — Telehealth (INDEPENDENT_AMBULATORY_CARE_PROVIDER_SITE_OTHER): Payer: Self-pay

## 2024-07-03 LAB — SURGICAL PATHOLOGY

## 2024-07-03 MED ORDER — OXYCODONE-ACETAMINOPHEN 5-325 MG PO TABS: 1.0000 | ORAL_TABLET | ORAL | 0 refills | Status: AC | PRN

## 2024-07-03 MED ORDER — ONDANSETRON HCL 4 MG PO TABS: 4.0000 mg | ORAL_TABLET | Freq: Three times a day (TID) | ORAL | 0 refills | Status: AC | PRN

## 2024-07-03 NOTE — Telephone Encounter (Signed)
 Patient left a message stating that she had surgery yesterday and Dr Karis advised to take Tylenol .  The patient stated the tylenol  is not helping, she has a horrible headache and nausea, she has been unable to eat.    Please call at [phone]

## 2024-07-04 ENCOUNTER — Encounter: Payer: Self-pay | Admitting: Gastroenterology

## 2024-07-05 ENCOUNTER — Encounter (INDEPENDENT_AMBULATORY_CARE_PROVIDER_SITE_OTHER): Payer: Self-pay | Admitting: Otolaryngology

## 2024-07-05 ENCOUNTER — Ambulatory Visit (INDEPENDENT_AMBULATORY_CARE_PROVIDER_SITE_OTHER): Admitting: Otolaryngology

## 2024-07-05 VITALS — BP 131/81 | HR 55

## 2024-07-05 DIAGNOSIS — J310 Chronic rhinitis: Secondary | ICD-10-CM | POA: Insufficient documentation

## 2024-07-05 DIAGNOSIS — Z9889 Other specified postprocedural states: Secondary | ICD-10-CM

## 2024-07-05 NOTE — Progress Notes (Signed)
 Doyle splints removed. Septum and turbinates are healing well.   Both Foss debrided.  Nasal saline irrigation.  Recheck in 3 weeks.

## 2024-07-24 ENCOUNTER — Encounter: Payer: Self-pay | Admitting: Nurse Practitioner

## 2024-07-31 ENCOUNTER — Ambulatory Visit (INDEPENDENT_AMBULATORY_CARE_PROVIDER_SITE_OTHER): Admitting: Otolaryngology

## 2024-08-13 ENCOUNTER — Encounter: Payer: Self-pay | Admitting: Nurse Practitioner

## 2024-09-03 ENCOUNTER — Ambulatory Visit: Admitting: Neurology

## 2024-09-27 ENCOUNTER — Telehealth: Payer: Self-pay | Admitting: Neurology

## 2024-09-27 NOTE — Telephone Encounter (Signed)
 [facility] conf

## 2024-10-01 ENCOUNTER — Ambulatory Visit: Payer: Self-pay | Admitting: Neurology

## 2024-10-01 ENCOUNTER — Encounter: Payer: Self-pay | Admitting: Neurology

## 2025-02-14 ENCOUNTER — Ambulatory Visit: Payer: Self-pay | Admitting: Neurology
